# Patient Record
Sex: Female | Born: 1935
Health system: Southern US, Community
[De-identification: ages and names within clinical notes are randomized; demographics above are authoritative.]

## PROBLEM LIST (undated history)

## (undated) DIAGNOSIS — R0789 Other chest pain: Secondary | ICD-10-CM

## (undated) DIAGNOSIS — K219 Gastro-esophageal reflux disease without esophagitis: Secondary | ICD-10-CM

## (undated) DIAGNOSIS — R1310 Dysphagia, unspecified: Secondary | ICD-10-CM

## (undated) DIAGNOSIS — M1990 Unspecified osteoarthritis, unspecified site: Secondary | ICD-10-CM

## (undated) DIAGNOSIS — I10 Essential (primary) hypertension: Secondary | ICD-10-CM

## (undated) DIAGNOSIS — I351 Nonrheumatic aortic (valve) insufficiency: Secondary | ICD-10-CM

## (undated) DIAGNOSIS — M810 Age-related osteoporosis without current pathological fracture: Secondary | ICD-10-CM

## (undated) DIAGNOSIS — C50919 Malignant neoplasm of unspecified site of unspecified female breast: Secondary | ICD-10-CM

## (undated) DIAGNOSIS — J45909 Unspecified asthma, uncomplicated: Secondary | ICD-10-CM

## (undated) HISTORY — DX: Nonrheumatic aortic (valve) insufficiency: I35.1

## (undated) HISTORY — PX: MASTECTOMY: SHX3

## (undated) HISTORY — DX: Essential (primary) hypertension: I10

## (undated) HISTORY — PX: BUNIONECTOMY: SHX129

## (undated) HISTORY — DX: Malignant neoplasm of unspecified site of unspecified female breast: C50.919

## (undated) HISTORY — DX: Unspecified osteoarthritis, unspecified site: M19.90

## (undated) HISTORY — DX: Other chest pain: R07.89

## (undated) HISTORY — PX: HAMMER TOE SURGERY: SHX385

## (undated) HISTORY — DX: Age-related osteoporosis without current pathological fracture: M81.0

## (undated) HISTORY — DX: Unspecified asthma, uncomplicated: J45.909

## (undated) HISTORY — DX: Gastro-esophageal reflux disease without esophagitis: K21.9

## (undated) HISTORY — DX: Dysphagia, unspecified: R13.10

---

## 1985-04-20 HISTORY — PX: MASTECTOMY: SHX3

## 2008-05-05 ENCOUNTER — Emergency Department (HOSPITAL_COMMUNITY): Admission: EM | Admit: 2008-05-05 | Discharge: 2008-05-05 | Payer: Self-pay | Admitting: Emergency Medicine

## 2010-08-04 LAB — URINE MICROSCOPIC-ADD ON

## 2010-08-04 LAB — DIFFERENTIAL
Basophils Absolute: 0.1 10*3/uL (ref 0.0–0.1)
Lymphocytes Relative: 44 % (ref 12–46)
Neutro Abs: 1.7 10*3/uL (ref 1.7–7.7)

## 2010-08-04 LAB — GLUCOSE, CAPILLARY: Glucose-Capillary: 88 mg/dL (ref 70–99)

## 2010-08-04 LAB — PROTIME-INR
INR: 1.1 (ref 0.00–1.49)
Prothrombin Time: 14.3 seconds (ref 11.6–15.2)

## 2010-08-04 LAB — URINALYSIS, ROUTINE W REFLEX MICROSCOPIC
Bilirubin Urine: NEGATIVE
Nitrite: POSITIVE — AB
Specific Gravity, Urine: 1.018 (ref 1.005–1.030)
pH: 6 (ref 5.0–8.0)

## 2010-08-04 LAB — COMPREHENSIVE METABOLIC PANEL
GFR calc Af Amer: >60 mL/min (ref >60)
GFR calc non Af Amer: 56 mL/min — ABNORMAL LOW (ref >60)
Glucose, Bld: 100 mg/dL — ABNORMAL HIGH (ref 70–99)
Potassium: 3.7 mEq/L (ref 3.5–5.1)
Sodium: 144 mEq/L (ref 135–145)
Total Bilirubin: 0.6 mg/dL (ref 0.3–1.2)
Total Protein: 7.3 g/dL (ref 6.0–8.3)

## 2010-08-04 LAB — CBC
Hemoglobin: 12.9 g/dL (ref 12.0–15.0)
RDW: 13.6 % (ref 11.5–15.5)

## 2010-08-04 LAB — SEDIMENTATION RATE: Sed Rate: 20 mm/hr (ref 0–22)

## 2012-06-29 DIAGNOSIS — M810 Age-related osteoporosis without current pathological fracture: Secondary | ICD-10-CM | POA: Insufficient documentation

## 2012-06-29 DIAGNOSIS — K219 Gastro-esophageal reflux disease without esophagitis: Secondary | ICD-10-CM | POA: Insufficient documentation

## 2012-06-29 DIAGNOSIS — J4520 Mild intermittent asthma, uncomplicated: Secondary | ICD-10-CM | POA: Insufficient documentation

## 2012-06-29 DIAGNOSIS — Z853 Personal history of malignant neoplasm of breast: Secondary | ICD-10-CM | POA: Insufficient documentation

## 2012-06-29 DIAGNOSIS — M1990 Unspecified osteoarthritis, unspecified site: Secondary | ICD-10-CM | POA: Insufficient documentation

## 2012-08-31 DIAGNOSIS — R1319 Other dysphagia: Secondary | ICD-10-CM | POA: Insufficient documentation

## 2012-12-01 DIAGNOSIS — K222 Esophageal obstruction: Secondary | ICD-10-CM | POA: Insufficient documentation

## 2012-12-01 DIAGNOSIS — B3781 Candidal esophagitis: Secondary | ICD-10-CM | POA: Insufficient documentation

## 2013-06-27 DIAGNOSIS — R109 Unspecified abdominal pain: Secondary | ICD-10-CM | POA: Insufficient documentation

## 2013-08-23 ENCOUNTER — Encounter: Payer: Self-pay | Admitting: Podiatry

## 2013-08-23 ENCOUNTER — Ambulatory Visit (INDEPENDENT_AMBULATORY_CARE_PROVIDER_SITE_OTHER): Payer: Medicare Other | Admitting: Podiatry

## 2013-08-23 ENCOUNTER — Ambulatory Visit (INDEPENDENT_AMBULATORY_CARE_PROVIDER_SITE_OTHER): Payer: Medicare Other

## 2013-08-23 VITALS — BP 116/73 | HR 73 | Resp 16 | Ht 61.0 in | Wt 136.0 lb

## 2013-08-23 DIAGNOSIS — M2040 Other hammer toe(s) (acquired), unspecified foot: Secondary | ICD-10-CM

## 2013-08-23 DIAGNOSIS — M779 Enthesopathy, unspecified: Secondary | ICD-10-CM

## 2013-08-23 DIAGNOSIS — L84 Corns and callosities: Secondary | ICD-10-CM

## 2013-08-23 DIAGNOSIS — M2010 Hallux valgus (acquired), unspecified foot: Secondary | ICD-10-CM

## 2013-08-23 MED ORDER — TRIAMCINOLONE ACETONIDE 10 MG/ML IJ SUSP
10.0000 mg | Freq: Once | INTRAMUSCULAR | Status: AC
  Administered 2013-08-23: 10 mg

## 2013-08-23 NOTE — Progress Notes (Signed)
   Subjective:    Patient ID: Joy Cain, female    DOB: October 24, 1935, 78 y.o.   MRN: [id number]  HPI Comments: "My toe hurts"  Patient c/o aching 5th toe left for about 2 months. The area is callused. Hurts with shoes. She has been using medicated corns pad-makes sensitive.   Also, concerned about thickened skin plantar bilateral.  Toe Pain       Review of Systems  Constitutional: Positive for unexpected weight change.  HENT: Positive for trouble swallowing.   Eyes: Positive for itching.  Gastrointestinal: Positive for abdominal pain.  Musculoskeletal: Positive for arthralgias.  Skin: Positive for rash.  Neurological: Positive for dizziness and light-headedness.  All other systems reviewed and are negative.      Objective:   Physical Exam        Assessment & Plan:

## 2013-08-24 NOTE — Progress Notes (Signed)
Subjective:     Patient ID: Joy Cain, female   DOB: 04-19-1936, 78 y.o.   MRN: [id number]  Toe Pain    patient states that of a painful fifth toe on my left foot that has keratotic tissue formation and fluid buildup and is making it hard for me to wear shoe gear   Review of Systems  All other systems reviewed and are negative.      Objective:   Physical Exam  Nursing note and vitals reviewed. Constitutional: She is oriented to person, place, and time.  Cardiovascular: Intact distal pulses.   Musculoskeletal: Normal range of motion.  Neurological: She is oriented to person, place, and time.  Skin: Skin is warm.   neurovascular status is intact with muscle strength adequate range of motion of the subtalar midtarsal joint mildly restricted. Patient has structural malalignment of digits with structural bunion formation and digital deformity with fifth toe left being rotated with keratotic lesion formation and fluid buildup at the interphalangeal joint     Assessment:     Forefoot structural derangement with inflamed fifth toe left foot that is painful when pressed with lesion formation    Plan:     H&P and x-ray reviewed. Careful interphalangeal joint injection last 2 mg dexamethasone 3 mg Xylocaine and debridement of lesion accomplished with padding. Reappoint as needed for consideration of surgery

## 2013-09-18 ENCOUNTER — Telehealth: Payer: Self-pay | Admitting: *Deleted

## 2013-09-18 NOTE — Telephone Encounter (Signed)
In to see Dr. Paulla Dolly 2-3 weeks ago.  He did a procedure but my toe is not feeling any better.  In fact, I think it has gotten worse.  I'll be expecting a call.  I called and left the patient a message that the only way to get rid of it permanently per Dr. Paulla Dolly is through surgery.  If you want it trimmed again give [place] a call back to schedule an appointment with Dr. Paulla Dolly.

## 2014-01-09 DIAGNOSIS — R1310 Dysphagia, unspecified: Secondary | ICD-10-CM | POA: Diagnosis not present

## 2014-01-09 DIAGNOSIS — Z853 Personal history of malignant neoplasm of breast: Secondary | ICD-10-CM | POA: Diagnosis not present

## 2014-01-09 DIAGNOSIS — R42 Dizziness and giddiness: Secondary | ICD-10-CM | POA: Diagnosis not present

## 2014-01-09 DIAGNOSIS — Z23 Encounter for immunization: Secondary | ICD-10-CM | POA: Diagnosis not present

## 2014-01-09 DIAGNOSIS — Z1239 Encounter for other screening for malignant neoplasm of breast: Secondary | ICD-10-CM | POA: Diagnosis not present

## 2014-01-12 ENCOUNTER — Other Ambulatory Visit: Payer: Self-pay | Admitting: Family Medicine

## 2014-01-12 DIAGNOSIS — R42 Dizziness and giddiness: Secondary | ICD-10-CM

## 2014-01-12 DIAGNOSIS — Z853 Personal history of malignant neoplasm of breast: Secondary | ICD-10-CM

## 2014-01-16 ENCOUNTER — Ambulatory Visit
Admission: RE | Admit: 2014-01-16 | Discharge: 2014-01-16 | Disposition: A | Discharge status: Home / Self Care | Payer: PRIVATE HEALTH INSURANCE | Source: Ambulatory Visit | Attending: Family Medicine | Admitting: Family Medicine

## 2014-01-16 DIAGNOSIS — R42 Dizziness and giddiness: Secondary | ICD-10-CM | POA: Diagnosis not present

## 2014-01-16 DIAGNOSIS — Z853 Personal history of malignant neoplasm of breast: Secondary | ICD-10-CM | POA: Diagnosis not present

## 2014-01-16 DIAGNOSIS — H519 Unspecified disorder of binocular movement: Secondary | ICD-10-CM | POA: Diagnosis not present

## 2014-01-16 DIAGNOSIS — R51 Headache: Secondary | ICD-10-CM | POA: Diagnosis not present

## 2014-01-18 ENCOUNTER — Telehealth: Payer: Self-pay | Admitting: *Deleted

## 2014-01-18 NOTE — Telephone Encounter (Signed)
Received referral from [facility] on 01/17/14.  Called pt and confirmed 02/13/14 appt w/ pt.  Mailed welcoming packet, calendar and intake form to pt.  Called and left a message for Amy at the referring office to make her aware.

## 2014-02-13 ENCOUNTER — Encounter (INDEPENDENT_AMBULATORY_CARE_PROVIDER_SITE_OTHER): Payer: Self-pay

## 2014-02-13 ENCOUNTER — Encounter: Payer: Self-pay | Admitting: Hematology and Oncology

## 2014-02-13 ENCOUNTER — Ambulatory Visit: Payer: PRIVATE HEALTH INSURANCE

## 2014-02-13 ENCOUNTER — Ambulatory Visit (HOSPITAL_BASED_OUTPATIENT_CLINIC_OR_DEPARTMENT_OTHER): Payer: MEDICARE | Admitting: Hematology and Oncology

## 2014-02-13 VITALS — BP 173/66 | HR 60 | Temp 98.4°F | Resp 18 | Ht 61.0 in | Wt 146.3 lb

## 2014-02-13 DIAGNOSIS — Z853 Personal history of malignant neoplasm of breast: Secondary | ICD-10-CM

## 2014-02-13 DIAGNOSIS — C50512 Malignant neoplasm of lower-outer quadrant of left female breast: Secondary | ICD-10-CM | POA: Insufficient documentation

## 2014-02-13 NOTE — Progress Notes (Signed)
[facility] CONSULT NOTE  Patient Care Team: No Pcp Per Patient as PCP - General (General Practice)  CHIEF COMPLAINTS/PURPOSE OF CONSULTATION:  Breast cancer 1986  HISTORY OF PRESENTING ILLNESS:  Joy Cain 78 y.o. female is here because of recent diagnosis of left breast cancer diagnosed 1986 treated with mastectomy. She reports that she did not require any adjuvant therapy. She has had no major problems with the cost of the breast. She gets annual mammograms which have all been normal. She is more to [place] area from [place] and would like to follow up with [place]. She reports no new problems or concerns other than the recent headaches and vertigo. She recent brain MRI which was normal.  I reviewed her records extensively and collaborated the history with the patient.  MEDICAL HISTORY:  Asthma, osteoporosis, acid reflux, dysphagia, left breast cancer SURGICAL HISTORY: Mastectomy in 1986 For foot bunion removal surgeries and for hammertoe surgery SOCIAL HISTORY: History   Social History  . Marital Status: Married    Spouse Name: N/A    Number of Children: N/A  . Years of Education: N/A   Occupational History  . Not on file.   Social History Main Topics  . Smoking status: Never Smoker   . Smokeless tobacco: Not on file  . Alcohol Use: No  . Drug Use: Not on file  . Sexual Activity: Not on file   Other Topics Concern  . Not on file   Social History Narrative  . No narrative on file    FAMILY HISTORY: Sister age 41 of breast cancer  ALLERGIES:  is allergic to aspirin.  MEDICATIONS:  Current Outpatient Prescriptions  Medication Sig Dispense Refill  . Lansoprazole (PREVACID PO) Take by mouth.       No current facility-administered medications for this visit.    REVIEW OF SYSTEMS:   Constitutional: Denies fevers, chills or abnormal night sweats Eyes: Headaches and vertigo Ears, nose, mouth, throat, and face: Denies mucositis or sore  throat Respiratory: Denies cough, dyspnea or wheezes Cardiovascular: Denies palpitation, chest discomfort or lower extremity swelling Gastrointestinal:  Denies nausea, heartburn or change in bowel habits Skin: Denies abnormal skin rashes Lymphatics: Denies new lymphadenopathy or easy bruising Neurological:Denies numbness, tingling or new weaknesses Behavioral/Psych: Mood is stable, no new changes  Breast:  Denies any palpable lumps or discharge All other systems were reviewed with the patient and are negative.  PHYSICAL EXAMINATION: ECOG PERFORMANCE STATUS: 1 - Symptomatic but completely ambulatory  Filed Vitals:   02/13/14 1351  BP: 173/66  Pulse:   Temp:   Resp:    Filed Weights   02/13/14 1320  Weight: 146 lb 4.8 oz (66.361 kg)    GENERAL:alert, no distress and comfortable SKIN: skin color, texture, turgor are normal, no rashes or significant lesions EYES: normal, conjunctiva are pink and non-injected, sclera clear OROPHARYNX:no exudate, no erythema and lips, buccal mucosa, and tongue normal  NECK: supple, thyroid normal size, non-tender, without nodularity LYMPH:  no palpable lymphadenopathy in the cervical, axillary or inguinal LUNGS: clear to auscultation and percussion with normal breathing effort HEART: regular rate & rhythm and no murmurs and no lower extremity edema ABDOMEN:abdomen soft, non-tender and normal bowel sounds Musculoskeletal:no cyanosis of digits and no clubbing  PSYCH: alert & oriented x 3 with fluent speech NEURO: no focal motor/sensory deficits BREAST: No palpable nodules in breast. No palpable axillary or supraclavicular lymphadenopathy  LABORATORY DATA:  I have reviewed the data as listed Lab Results  Component  Value Date   WBC 4.2 05/05/2008   HGB 12.9 05/05/2008   HCT 39.3 05/05/2008   MCV 83.9 05/05/2008   PLT 205 05/05/2008   Lab Results  Component Value Date   NA 144 05/05/2008   K 3.7 05/05/2008   CL 110 05/05/2008   CO2 29 05/05/2008     RADIOGRAPHIC STUDIES: I have personally reviewed the radiological reports and agreed with the findings in the report.  ASSESSMENT AND PLAN:  Breast cancer of lower-outer quadrant of left female breast Left breast cancer diagnosed and treated in [place] probably stage I we do not know the receptor status the patient had a mastectomy. She reports that she did not require any antiestrogen therapy or radiation afterwards. She has been followed routinely with bone scans one to 2006 apparently. After that she has been only father with mammograms. She has been at [facility] until recently moved to [place] area and wanted to set up oncology care with [place]. Recently she had an episode of dizziness and headache for which she had brain MRI which was normal.  Surveillance: [name] breast exam was completely normal. Recent headaches: I encouraged her to discuss with her primary care about other causes of headache. The recent headache was associated with vertigo as well.  Return to clinic annually for physical exam and mammograms.   All questions were answered. The patient knows to call the clinic with any problems, questions or concerns. I spent 30 minutes counseling the patient face to face. The total time spent in the appointment was 45 minutes and more than 50% was on counseling.     Rulon Eisenmenger, MD 02/13/2014 5:13 PM

## 2014-02-13 NOTE — Assessment & Plan Note (Signed)
Left breast cancer diagnosed and treated in [place] probably stage I we do not know the receptor status the patient had a mastectomy. She reports that she did not require any antiestrogen therapy or radiation afterwards. She has been followed routinely with bone scans one to 2006 apparently. After that she has been only father with mammograms. She has been at [facility] until recently moved to [place] area and wanted to set up oncology care with [place]. Recently she had an episode of dizziness and headache for which she had brain MRI which was normal.  Surveillance: [name] breast exam was completely normal. Recent headaches: I encouraged her to discuss with her primary care about other causes of headache. The recent headache was associated with vertigo as well.  Return to clinic annually for physical exam and mammograms.

## 2014-02-13 NOTE — Progress Notes (Signed)
Checked in new pt with no financial concerns at this time.  Pt has Raquel's card for any questions or concerns she may have in the future.  ° °

## 2014-02-21 ENCOUNTER — Encounter: Payer: Self-pay | Admitting: Hematology and Oncology

## 2014-02-21 NOTE — Addendum Note (Signed)
Addended by: Prentiss Bells on: 02/21/2014 02:58 PM   Modules accepted: Orders, Medications

## 2014-02-21 NOTE — Progress Notes (Signed)
New patient intake form sent to scan.   

## 2014-02-22 ENCOUNTER — Telehealth: Payer: Self-pay | Admitting: Hematology and Oncology

## 2014-02-27 ENCOUNTER — Other Ambulatory Visit: Payer: Self-pay | Admitting: Hematology and Oncology

## 2014-02-27 DIAGNOSIS — Z1231 Encounter for screening mammogram for malignant neoplasm of breast: Secondary | ICD-10-CM

## 2014-03-20 ENCOUNTER — Ambulatory Visit
Admission: RE | Admit: 2014-03-20 | Discharge: 2014-03-20 | Disposition: A | Discharge status: Home / Self Care | Payer: MEDICARE | Source: Ambulatory Visit | Attending: Hematology and Oncology | Admitting: Hematology and Oncology

## 2014-03-20 DIAGNOSIS — Z1231 Encounter for screening mammogram for malignant neoplasm of breast: Secondary | ICD-10-CM

## 2015-02-03 ENCOUNTER — Emergency Department (HOSPITAL_COMMUNITY): Payer: Medicare Other

## 2015-02-03 ENCOUNTER — Encounter (HOSPITAL_COMMUNITY): Payer: Self-pay

## 2015-02-03 ENCOUNTER — Observation Stay (HOSPITAL_COMMUNITY)
Admission: EM | Admit: 2015-02-03 | Discharge: 2015-02-05 | Disposition: A | Discharge status: Home / Self Care | Payer: Medicare Other | Attending: Family Medicine | Admitting: Family Medicine

## 2015-02-03 DIAGNOSIS — R262 Difficulty in walking, not elsewhere classified: Secondary | ICD-10-CM | POA: Diagnosis not present

## 2015-02-03 DIAGNOSIS — R001 Bradycardia, unspecified: Secondary | ICD-10-CM | POA: Insufficient documentation

## 2015-02-03 DIAGNOSIS — M1990 Unspecified osteoarthritis, unspecified site: Secondary | ICD-10-CM | POA: Diagnosis not present

## 2015-02-03 DIAGNOSIS — IMO0001 Reserved for inherently not codable concepts without codable children: Secondary | ICD-10-CM | POA: Diagnosis present

## 2015-02-03 DIAGNOSIS — R200 Anesthesia of skin: Secondary | ICD-10-CM | POA: Diagnosis not present

## 2015-02-03 DIAGNOSIS — I639 Cerebral infarction, unspecified: Secondary | ICD-10-CM | POA: Diagnosis not present

## 2015-02-03 DIAGNOSIS — R42 Dizziness and giddiness: Secondary | ICD-10-CM | POA: Diagnosis not present

## 2015-02-03 DIAGNOSIS — Z886 Allergy status to analgesic agent status: Secondary | ICD-10-CM | POA: Insufficient documentation

## 2015-02-03 DIAGNOSIS — Z853 Personal history of malignant neoplasm of breast: Secondary | ICD-10-CM | POA: Diagnosis not present

## 2015-02-03 DIAGNOSIS — R030 Elevated blood-pressure reading, without diagnosis of hypertension: Secondary | ICD-10-CM | POA: Diagnosis not present

## 2015-02-03 DIAGNOSIS — H8110 Benign paroxysmal vertigo, unspecified ear: Secondary | ICD-10-CM | POA: Diagnosis not present

## 2015-02-03 LAB — PROTIME-INR
INR: 1.18 (ref 0.00–1.49)
Prothrombin Time: 15.2 seconds (ref 11.6–15.2)

## 2015-02-03 LAB — I-STAT CHEM 8, ED
BUN: 15 mg/dL (ref 6–20)
CALCIUM ION: 1.15 mmol/L (ref 1.13–1.30)
CHLORIDE: 104 mmol/L (ref 101–111)
Creatinine, Ser: 1.2 mg/dL — ABNORMAL HIGH (ref 0.44–1.00)
GLUCOSE: 100 mg/dL — AB (ref 65–99)
HCT: 41 % (ref 36.0–46.0)
HEMOGLOBIN: 13.9 g/dL (ref 12.0–15.0)
POTASSIUM: 3.9 mmol/L (ref 3.5–5.1)
SODIUM: 141 mmol/L (ref 135–145)
TCO2: 25 mmol/L (ref 0–100)

## 2015-02-03 LAB — COMPREHENSIVE METABOLIC PANEL
ALK PHOS: 70 U/L (ref 38–126)
ALT: 15 U/L (ref 14–54)
AST: 22 U/L (ref 15–41)
Albumin: 3.8 g/dL (ref 3.5–5.0)
Anion gap: 8 (ref 5–15)
BILIRUBIN TOTAL: 0.4 mg/dL (ref 0.3–1.2)
BUN: 13 mg/dL (ref 6–20)
CALCIUM: 8.8 mg/dL — AB (ref 8.9–10.3)
CO2: 26 mmol/L (ref 22–32)
CREATININE: 1.18 mg/dL — AB (ref 0.44–1.00)
Chloride: 101 mmol/L (ref 101–111)
GFR, EST AFRICAN AMERICAN: 50 mL/min — AB (ref >60)
GFR, EST NON AFRICAN AMERICAN: 43 mL/min — AB (ref >60)
Glucose, Bld: 98 mg/dL (ref 65–99)
Potassium: 3.9 mmol/L (ref 3.5–5.1)
Sodium: 135 mmol/L (ref 135–145)
TOTAL PROTEIN: 8.1 g/dL (ref 6.5–8.1)

## 2015-02-03 LAB — CBC
HEMATOCRIT: 41 % (ref 36.0–46.0)
Hemoglobin: 13.4 g/dL (ref 12.0–15.0)
MCH: 28 pg (ref 26.0–34.0)
MCHC: 32.7 g/dL (ref 30.0–36.0)
MCV: 85.6 fL (ref 78.0–100.0)
Platelets: 190 10*3/uL (ref 150–400)
RBC: 4.79 MIL/uL (ref 3.87–5.11)
RDW: 13.7 % (ref 11.5–15.5)
WBC: 4.6 10*3/uL (ref 4.0–10.5)

## 2015-02-03 LAB — DIFFERENTIAL
Basophils Absolute: 0 10*3/uL (ref 0.0–0.1)
Basophils Relative: 0 %
EOS PCT: 0 %
Eosinophils Absolute: 0 10*3/uL (ref 0.0–0.7)
LYMPHS ABS: 1.9 10*3/uL (ref 0.7–4.0)
LYMPHS PCT: 41 %
MONO ABS: 0.4 10*3/uL (ref 0.1–1.0)
MONOS PCT: 9 %
NEUTROS ABS: 2.2 10*3/uL (ref 1.7–7.7)
Neutrophils Relative %: 50 %

## 2015-02-03 LAB — I-STAT TROPONIN, ED: Troponin i, poc: 0.01 ng/mL (ref 0.00–0.08)

## 2015-02-03 LAB — APTT: aPTT: 33 seconds (ref 24–37)

## 2015-02-03 LAB — CBG MONITORING, ED
Glucose-Capillary: 94 mg/dL (ref 65–99)
Glucose-Capillary: 105 mg/dL — ABNORMAL HIGH (ref 65–99)

## 2015-02-03 NOTE — H&P (Signed)
[facility] History and Physical  Joy Cain GYJ:[id number] DOB: [birth year] DOA: 02/03/2015  Referring physician: Dr. Tamera Punt. PCP: PROVIDER NOT IN SYSTEM Dr. Chapman Fitch. Specialists: Dr. Lindi Adie. Oncologist.  Chief Complaint: Dizziness and left-sided numbness.  HPI: Joy Cain is a 79 y.o. female with history of breast cancer in remission presence of the ER because of left-sided numbness and persistent dizziness. Patient has been having vertigo off and on for last few weeks but over the last 48 hours it has become persistent. Patient has mild left-sided headache. Denies any visual symptoms nausea vomiting or urinary difficulties. This evening while watching football patient started developing numbness of the left hand and left foot. This has been persistent. In the ER CT head was negative for anything acute. On-call neurologist Dr. Doy Mince has been consulted and patient has been admitted for further management for possible TIA versus stroke. Patient denies any weakness of upper or lower extremities. Denies any difficulty speaking or swallowing the patient had some burning sensation in the throat yesterday. Patient's blood pressures found to be elevated and patient is mildly bradycardic. Patient states she does not have any previous history of hypertension. Denies any chest pain or palpitation. On exam patient is able to move all extremities with good strength.   Review of Systems: As presented in the history of presenting illness, rest negative.  Past Medical History  Diagnosis Date  . Breast cancer ([place])   . Asthma   . Osteoporosis   . GERD (gastroesophageal reflux disease)   . Dysphagia    Past Surgical History  Procedure Laterality Date  . Bunionectomy    . Hammer toe surgery    . Mastectomy     Social History:  reports that she has never smoked. She has never used smokeless tobacco. She reports that she does not drink alcohol or use illicit drugs. Where does patient live  home. Can patient participate in ADLs? Yes.  Allergies  Allergen Reactions  . Aspirin Other (See Comments)    GI TRACT UPSET    Family History:  Family History  Problem Relation Age of Onset  . Cancer Sister   . Cancer Brother   . Stroke Father       Prior to Admission medications   Medication Sig Start Date End Date Taking? Authorizing Provider  glucosamine-chondroitin 500-400 MG tablet Take 1 tablet by mouth daily as needed ([place]).   Yes Historical Provider, MD  ibuprofen (ADVIL,MOTRIN) 200 MG tablet Take 400 mg by mouth every 6 (six) hours as needed.   Yes Historical Provider, MD    Physical Exam: Filed Vitals:   02/03/15 2215 02/03/15 2230 02/03/15 2245 02/03/15 2300  BP: 160/70 177/70  170/80  Pulse: 55 61 60 54  Temp:      TempSrc:      Resp: 20     Height:      SpO2: 95% 100% 99% 96%     General:  Moderately built and nourished.  Eyes: Anicteric. No pallor.  ENT: No discharge from the ears eyes nose and mouth.  Neck: No JVD appreciated. No mass felt. No neck rigidity.  Cardiovascular: S1-S2 heard.  Respiratory: No rhonchi or crepitations.  Abdomen: Soft nontender bowel sounds present.  Skin: No rash.  Musculoskeletal: No edema.  Psychiatric: Appears normal.  Neurologic: Alert awake oriented to time place and person. Moves all extremities 5 x 5. No facial asymmetry. Tongue is midline. PERRLA positive.  Labs on Admission:  Basic Metabolic Panel:  Recent Labs  Lab 02/03/15 2055 02/03/15 2112  NA 135 141  K 3.9 3.9  CL 101 104  CO2 26  --   GLUCOSE 98 100*  BUN 13 15  CREATININE 1.18* 1.20*  CALCIUM 8.8*  --    Liver Function Tests:  Recent Labs Lab 02/03/15 2055  AST 22  ALT 15  ALKPHOS 70  BILITOT 0.4  PROT 8.1  ALBUMIN 3.8   No results for input(s): LIPASE, AMYLASE in the last 168 hours. No results for input(s): AMMONIA in the last 168 hours. CBC:  Recent Labs Lab 02/03/15 2055 02/03/15 2112  WBC 4.6  --    NEUTROABS 2.2  --   HGB 13.4 13.9  HCT 41.0 41.0  MCV 85.6  --   PLT 190  --    Cardiac Enzymes: No results for input(s): CKTOTAL, CKMB, CKMBINDEX, TROPONINI in the last 168 hours.  BNP (last 3 results) No results for input(s): BNP in the last 8760 hours.  ProBNP (last 3 results) No results for input(s): PROBNP in the last 8760 hours.  CBG:  Recent Labs Lab 02/03/15 2054 02/03/15 2115  GLUCAP 94 105*    Radiological Exams on Admission: Ct Head Wo Contrast  02/03/2015  CLINICAL DATA:  Code stroke. Right arm numbness for 1 hr, now with right leg numbness. Headache. History of breast cancer. EXAM: CT HEAD WITHOUT CONTRAST TECHNIQUE: Contiguous axial images were obtained from the base of the skull through the vertex without intravenous contrast. COMPARISON:  Brain MRI 01/16/2014, head CT 05/05/2008 FINDINGS: No evidence of territorial infarct. No intracranial hemorrhage, mass effect, or midline shift. Mild chronic small vessel ischemic change. No hydrocephalus. The basilar cisterns are patent. No intracranial fluid collection. Calvarium is intact. Minimal fluid in the right side of sphenoid sinus, similar in degree to prior. Mild mucosal thickening of the ethmoid air cells. The mastoid air cells are well aerated. IMPRESSION: Mild chronic small vessel ischemic change without CT findings of acute intracranial abnormality. These results were called by telephone at the time of interpretation on 02/03/2015 at 9:19 pm to Dr. Doy Mince , who verbally acknowledged these results. Electronically Signed   By: Jeb Levering M.D.   On: 02/03/2015 21:19    EKG: Independently reviewed. Normal sinus rhythm with LVH.  Assessment/Plan Principal Problem:   Left sided numbness Active Problems:   Elevated blood pressure   1. Left-sided numbness with persistent dizziness/vertigo - concerning for possible stroke versus TIA. Patient has been placed on neurochecks. Swallow evaluation. Appreciate  neurology consult. Will get MRI/MRA brain 2-D echo and carotid Doppler. Aspirin. Check hemoglobin A1c and lipid panel. Physical therapy consult. 2. Elevated blood pressure - for now we will allow permissive hypertension for possible stroke. His stroke has been ruled out then if patient is still hypertensive then will need definite antihypertensives. Closely follow blood pressure trends. Patient is placed on when necessary IV hydralazine for systolic blood pressure more than 210. 3. Bradycardia - patient has sinus bradycardia with heart rate around the 58. Closely monitor in telemetry. Check TSH. 4. History of breast cancer in remission being followed by oncologist.  I have personally reviewed patient's EKG. I have reviewed neurologist consult and recommendations. I have reviewed patient's old charts and labs.   DVT Prophylaxis Lovenox.  Code Status: Full code.  Family Communication: Patient's husband.  Disposition Plan: Admit for observation.    Jakeel Starliper N. [facility] Pager [phone].  If 7PM-7AM, please contact night-coverage [email or web address] Password [email or web address] 02/03/2015, 11:24 PM

## 2015-02-03 NOTE — ED Notes (Signed)
Pt reports dizziness and headache onset 2 days ago. Pt reports left arm numbness onset tonight one hour PTA and leg numbness onset 30 minutes PTA. Left leg and arm weakness upon arrival. Pt speaking clear, complete sentences, no facial droop. Dr. Tamera Punt at triage to assess patient and code stroke called.

## 2015-02-03 NOTE — ED Provider Notes (Signed)
CSN: [id number]     Arrival date & time 02/03/15  2046 History   First MD Initiated Contact with Patient 02/03/15 2129     Chief Complaint  Patient presents with  . Code Stroke     (Consider location/radiation/quality/duration/timing/severity/associated sxs/prior Treatment) HPI Comments: Patient presents with dizziness and left-sided weakness. She has a history of asthma and GERD. She states that she has intermittent episodes of vertigo. She's had vertigo for the last 2 days with dizziness and spinning sensation. She denies any headache. She states about an hour prior to arrival she noted some numbness in her left arm and left leg with some weakness in her left leg. She denies any speech deficits. There is no ataxia. No visual changes. Given that she had new onset numbness in her left arm left leg, code stroke was initiated.   Past Medical History  Diagnosis Date  . Breast cancer ([place])   . Asthma   . Osteoporosis   . GERD (gastroesophageal reflux disease)   . Dysphagia    Past Surgical History  Procedure Laterality Date  . Bunionectomy    . Hammer toe surgery    . Mastectomy     Family History  Problem Relation Age of Onset  . Cancer Sister   . Cancer Brother    Social History  Substance Use Topics  . Smoking status: Never Smoker   . Smokeless tobacco: Never Used  . Alcohol Use: No   OB History    No data available     Review of Systems  Constitutional: Negative for fever, chills, diaphoresis and fatigue.  HENT: Negative for congestion, rhinorrhea and sneezing.   Eyes: Negative.   Respiratory: Negative for cough, chest tightness and shortness of breath.   Cardiovascular: Negative for chest pain and leg swelling.  Gastrointestinal: Negative for nausea, vomiting, abdominal pain, diarrhea and blood in stool.  Genitourinary: Negative for frequency, hematuria, flank pain and difficulty urinating.  Musculoskeletal: Negative for back pain and arthralgias.  Skin: Negative  for rash.  Neurological: Positive for dizziness, weakness and numbness. Negative for speech difficulty and headaches.      Allergies  Aspirin  Home Medications   Prior to Admission medications   Medication Sig Start Date End Date Taking? Authorizing Provider  glucosamine-chondroitin 500-400 MG tablet Take 1 tablet by mouth daily as needed ([place]).   Yes Historical Provider, MD  ibuprofen (ADVIL,MOTRIN) 200 MG tablet Take 400 mg by mouth every 6 (six) hours as needed.   Yes Historical Provider, MD   BP 195/92 mmHg  Pulse 66  Temp(Src) 98.5 F (36.9 C) (Oral)  Resp 14  Ht 5' 1" (1.549 m)  SpO2 98% Physical Exam  Constitutional: She is oriented to person, place, and time. She appears well-developed and well-nourished.  HENT:  Head: Normocephalic and atraumatic.  Eyes: Pupils are equal, round, and reactive to light.  Neck: Normal range of motion. Neck supple.  Cardiovascular: Normal rate, regular rhythm and normal heart sounds.   Pulmonary/Chest: Effort normal and breath sounds normal. No respiratory distress. She has no wheezes. She has no rales. She exhibits no tenderness.  Abdominal: Soft. Bowel sounds are normal. There is no tenderness. There is no rebound and no guarding.  Musculoskeletal: Normal range of motion. She exhibits no edema.  Lymphadenopathy:    She has no cervical adenopathy.  Neurological: She is alert and oriented to person, place, and time.  Patient has some numbness to of left-sided face as well as the  left arm and left leg to light touch. She has some noted weakness to the left leg as compared to the right, although mild. She has diminished rapid alternating movements on the left side.  Skin: Skin is warm and dry. No rash noted.  Psychiatric: She has a normal mood and affect.    ED Course  Procedures (including critical care time) Labs Review Labs Reviewed  I-STAT CHEM 8, ED - Abnormal; Notable for the following:    Creatinine, Ser 1.20 (*)     Glucose, Bld 100 (*)    All other components within normal limits  PROTIME-INR  APTT  CBC  DIFFERENTIAL  COMPREHENSIVE METABOLIC PANEL  CBG MONITORING, ED  I-STAT TROPOININ, ED  CBG MONITORING, ED    Imaging Review Ct Head Wo Contrast  02/03/2015  CLINICAL DATA:  Code stroke. Right arm numbness for 1 hr, now with right leg numbness. Headache. History of breast cancer. EXAM: CT HEAD WITHOUT CONTRAST TECHNIQUE: Contiguous axial images were obtained from the base of the skull through the vertex without intravenous contrast. COMPARISON:  Brain MRI 01/16/2014, head CT 05/05/2008 FINDINGS: No evidence of territorial infarct. No intracranial hemorrhage, mass effect, or midline shift. Mild chronic small vessel ischemic change. No hydrocephalus. The basilar cisterns are patent. No intracranial fluid collection. Calvarium is intact. Minimal fluid in the right side of sphenoid sinus, similar in degree to prior. Mild mucosal thickening of the ethmoid air cells. The mastoid air cells are well aerated. IMPRESSION: Mild chronic small vessel ischemic change without CT findings of acute intracranial abnormality. These results were called by telephone at the time of interpretation on 02/03/2015 at 9:19 pm to Dr. Doy Mince , who verbally acknowledged these results. Electronically Signed   By: Jeb Levering M.D.   On: 02/03/2015 21:19   I have personally reviewed and evaluated these images and lab results as part of my medical decision-making.   EKG Interpretation   Date/Time:  Sunday February 03 2015 21:18:11 EDT Ventricular Rate:  61 PR Interval:  151 QRS Duration: 76 QT Interval:  413 QTC Calculation: 416 R Axis:   66 Text Interpretation:  Sinus rhythm LVH with secondary repolarization  abnormality since last tracing no significant change Confirmed by Alucard Fearnow   MD, Durk Carmen ([id number]) on 02/03/2015 9:50:07 PM      MDM   Final diagnoses:  Cerebrovascular accident (CVA), unspecified mechanism ([place])     Dr. Doy Mince has evaluated patient. She did not feel the patient met code stroke criteria given that she's had associated dizziness for 2 days. Patient will be admitted to hospitalist service for a stroke evaluation.  Pt's PCP is Dr. Chapman Fitch with [facility].  Spoke with DR. Jack Quarto who will admit pt.  Malvin Johns, MD 02/03/15 2233

## 2015-02-03 NOTE — ED Notes (Signed)
CODE STROKE CANCELED-PER DR. BELFI.

## 2015-02-03 NOTE — Consult Note (Signed)
Referring Physician: Tamera Punt    Chief Complaint: Dizziness, left sided numbness  HPI: Joy Cain is an 79 y.o. female who reports that she has had intermittent vertigo for the past 2 weeks.  Yesterday her vertigo became persistent.  She went to bed vertiginous and on awakening this morning was still dizzy.  Her vertigo persisted throughout the day and this evening while watching the football game noted the onset of numbness in the left hand and in the left leg below the knee.  The patient became concerned at that time and was brought in for evaluation.  Initial NIHSS of 1.  Date last known well: Date: 02/02/2015 Time last known well: Unable to determine tPA Given: No: Outside time window  MRankin: 0  Past Medical History  Diagnosis Date  . Breast cancer ([place])   . Asthma   . Osteoporosis   . GERD (gastroesophageal reflux disease)   . Dysphagia     Past Surgical History  Procedure Laterality Date  . Bunionectomy    . Hammer toe surgery    . Mastectomy      Family History  Problem Relation Age of Onset  . Cancer Sister   . Cancer Brother      Father died of a stroke.  Morther died due to heart problems  Social History:  reports that she has never smoked. She has never used smokeless tobacco. She reports that she does not drink alcohol or use illicit drugs.  Allergies:  Allergies  Allergen Reactions  . Aspirin Other (See Comments)    GI TRACT UPSET    Medications: I have reviewed the patient's current medications. Prior to Admission:  Prior to Admission medications   Medication Sig Start Date End Date Taking? Authorizing Provider  glucosamine-chondroitin 500-400 MG tablet Take 1 tablet by mouth daily as needed ([place]).   Yes Historical Provider, MD  ibuprofen (ADVIL,MOTRIN) 200 MG tablet Take 400 mg by mouth every 6 (six) hours as needed.   Yes Historical Provider, MD   ROS: History obtained from the patient  General ROS: negative for - chills, fatigue,  fever, night sweats, weight gain or weight loss Psychological ROS: negative for - behavioral disorder, hallucinations, memory difficulties, mood swings or suicidal ideation Ophthalmic ROS: negative for - blurry vision, double vision, eye pain or loss of vision ENT ROS: negative for - epistaxis, nasal discharge, oral lesions, sore throat, tinnitus or vertigo Allergy and Immunology ROS: negative for - hives or itchy/watery eyes Hematological and Lymphatic ROS: negative for - bleeding problems, bruising or swollen lymph nodes Endocrine ROS: negative for - galactorrhea, hair pattern changes, polydipsia/polyuria or temperature intolerance Respiratory ROS: negative for - cough, hemoptysis, shortness of breath or wheezing Cardiovascular ROS: negative for - chest pain, dyspnea on exertion, edema or irregular heartbeat Gastrointestinal ROS: negative for - abdominal pain, diarrhea, hematemesis, nausea/vomiting or stool incontinence Genito-Urinary ROS: negative for - dysuria, hematuria, incontinence or urinary frequency/urgency Musculoskeletal ROS: negative for - joint swelling or muscular weakness Neurological ROS: as noted in HPI Dermatological ROS: negative for rash and skin lesion changes  Physical Examination: Blood pressure 195/92, pulse 66, temperature 98.5 F (36.9 C), temperature source Oral, resp. rate 14, height 5\' 1"  (1.549 m), SpO2 98 %.  HEENT-  Normocephalic, no lesions, without obvious abnormality.  Normal external eye and conjunctiva.  Normal TM's bilaterally.  Normal auditory canals and external ears. Normal external nose, mucus membranes and septum.  Normal pharynx. Cardiovascular- S1, S2 normal, pulses palpable throughout  Lungs- chest clear, no wheezing, rales, normal symmetric air entry Abdomen- soft, non-tender; bowel sounds normal; no masses,  no organomegaly Extremities- no edema Lymph-no adenopathy palpable Musculoskeletal-no joint tenderness, deformity or swelling Skin-warm  and dry, no hyperpigmentation, vitiligo, or suspicious lesions  Neurological Examination Mental Status: Alert, oriented, thought content appropriate.  Speech fluent without evidence of aphasia.  Able to follow 3 step commands without difficulty. Cranial Nerves: II: Discs flat bilaterally; Visual fields grossly normal, pupils equal, round, reactive to light and accommodation III,IV, VI: ptosis not present, extra-ocular motions intact bilaterally V,VII: smile symmetric, facial light touch sensation decreased on the left VIII: hearing normal bilaterally IX,X: gag reflex present XI: bilateral shoulder shrug XII: midline tongue extension Motor: Right : Upper extremity   5/5    Left:     Upper extremity   5/5  Lower extremity   5/5     Lower extremity   5-/5 Tone and bulk:normal tone throughout; no atrophy noted Sensory: Pinprick and light touch decreased on the left upper and lower extremity Deep Tendon Reflexes: 2+ in the upper extremities and absent in the lower extremities Plantars: Right: downgoing   Left: downgoing Cerebellar: normal finger-to-nose and normal heel-to-shin testing bilaterally Gait: not tested due to safety concerns   Laboratory Studies:  Basic Metabolic Panel:  Recent Labs Lab 02/03/15 2112  NA 141  K 3.9  CL 104  GLUCOSE 100*  BUN 15  CREATININE 1.20*    Liver Function Tests: No results for input(s): AST, ALT, ALKPHOS, BILITOT, PROT, ALBUMIN in the last 168 hours. No results for input(s): LIPASE, AMYLASE in the last 168 hours. No results for input(s): AMMONIA in the last 168 hours.  CBC:  Recent Labs Lab 02/03/15 2055 02/03/15 2112  WBC 4.6  --   NEUTROABS 2.2  --   HGB 13.4 13.9  HCT 41.0 41.0  MCV 85.6  --   PLT 190  --     Cardiac Enzymes: No results for input(s): CKTOTAL, CKMB, CKMBINDEX, TROPONINI in the last 168 hours.  BNP: Invalid input(s): POCBNP  CBG:  Recent Labs Lab 02/03/15 2054  GLUCAP 94    Microbiology: No  results found for this or any previous visit.  Coagulation Studies:  Recent Labs  02/03/15 2055  LABPROT 15.2  INR 1.18    Urinalysis: No results for input(s): COLORURINE, LABSPEC, PHURINE, GLUCOSEU, HGBUR, BILIRUBINUR, KETONESUR, PROTEINUR, UROBILINOGEN, NITRITE, LEUKOCYTESUR in the last 168 hours.  Invalid input(s): APPERANCEUR  Lipid Panel: No results found for: CHOL, TRIG, HDL, CHOLHDL, VLDL, LDLCALC  HgbA1C: No results found for: HGBA1C  Urine Drug Screen:  No results found for: LABOPIA, COCAINSCRNUR, LABBENZ, AMPHETMU, THCU, LABBARB  Alcohol Level: No results for input(s): ETH in the last 168 hours.  Other results: EKG: sinus rhythm at 61 bpm.  Imaging: Ct Head Wo Contrast  02/03/2015  CLINICAL DATA:  Code stroke. Right arm numbness for 1 hr, now with right leg numbness. Headache. History of breast cancer. EXAM: CT HEAD WITHOUT CONTRAST TECHNIQUE: Contiguous axial images were obtained from the base of the skull through the vertex without intravenous contrast. COMPARISON:  Brain MRI 01/16/2014, head CT 05/05/2008 FINDINGS: No evidence of territorial infarct. No intracranial hemorrhage, mass effect, or midline shift. Mild chronic small vessel ischemic change. No hydrocephalus. The basilar cisterns are patent. No intracranial fluid collection. Calvarium is intact. Minimal fluid in the right side of sphenoid sinus, similar in degree to prior. Mild mucosal thickening of the ethmoid air cells. The mastoid air  cells are well aerated. IMPRESSION: Mild chronic small vessel ischemic change without CT findings of acute intracranial abnormality. These results were called by telephone at the time of interpretation on 02/03/2015 at 9:19 pm to Dr. Doy Mince , who verbally acknowledged these results. Electronically Signed   By: Jeb Levering M.D.   On: 02/03/2015 21:19    Assessment: 79 y.o. female presenting with complaints of vertigo and left sided paresthesias.  Patient outside window for  tPA.  On no antiplatelet therapy at home.  Head CT personally reviewed and shows no acute changes.  Concern remains for a posterior circulation ischemic event.    Stroke Risk Factors - family history  Plan: 1. HgbA1c, fasting lipid panel 2. MRI, MRA  of the brain without contrast 3. Prophylactic therapy-Antiplatelet med: Aspirin - dose 81mg  daily 4. NPO until RN stroke swallow screen 5. Telemetry monitoring 6. Frequent neuro checks 7. Meclizine prn  Case discussed with Dr. Neal Dy, MD [facility] [phone] 02/03/2015, 9:32 PM

## 2015-02-04 ENCOUNTER — Observation Stay (HOSPITAL_BASED_OUTPATIENT_CLINIC_OR_DEPARTMENT_OTHER): Payer: Medicare Other

## 2015-02-04 ENCOUNTER — Observation Stay (HOSPITAL_COMMUNITY): Payer: Medicare Other

## 2015-02-04 DIAGNOSIS — I6789 Other cerebrovascular disease: Secondary | ICD-10-CM | POA: Diagnosis not present

## 2015-02-04 DIAGNOSIS — R200 Anesthesia of skin: Secondary | ICD-10-CM | POA: Diagnosis not present

## 2015-02-04 DIAGNOSIS — R51 Headache: Secondary | ICD-10-CM | POA: Diagnosis not present

## 2015-02-04 LAB — CBC
HEMATOCRIT: 38.6 % (ref 36.0–46.0)
HEMOGLOBIN: 12.7 g/dL (ref 12.0–15.0)
MCH: 27.9 pg (ref 26.0–34.0)
MCHC: 32.9 g/dL (ref 30.0–36.0)
MCV: 84.6 fL (ref 78.0–100.0)
Platelets: 171 10*3/uL (ref 150–400)
RBC: 4.56 MIL/uL (ref 3.87–5.11)
RDW: 13.7 % (ref 11.5–15.5)
WBC: 4.4 10*3/uL (ref 4.0–10.5)

## 2015-02-04 LAB — GLUCOSE, CAPILLARY: GLUCOSE-CAPILLARY: 77 mg/dL (ref 65–99)

## 2015-02-04 LAB — COMPREHENSIVE METABOLIC PANEL
ALT: 14 U/L (ref 14–54)
ANION GAP: 7 (ref 5–15)
AST: 19 U/L (ref 15–41)
Albumin: 3.4 g/dL — ABNORMAL LOW (ref 3.5–5.0)
Alkaline Phosphatase: 57 U/L (ref 38–126)
BUN: 11 mg/dL (ref 6–20)
CHLORIDE: 106 mmol/L (ref 101–111)
CO2: 26 mmol/L (ref 22–32)
Calcium: 8.7 mg/dL — ABNORMAL LOW (ref 8.9–10.3)
Creatinine, Ser: 1.08 mg/dL — ABNORMAL HIGH (ref 0.44–1.00)
GFR, EST AFRICAN AMERICAN: 56 mL/min — AB (ref >60)
GFR, EST NON AFRICAN AMERICAN: 48 mL/min — AB (ref >60)
Glucose, Bld: 104 mg/dL — ABNORMAL HIGH (ref 65–99)
POTASSIUM: 3.5 mmol/L (ref 3.5–5.1)
SODIUM: 139 mmol/L (ref 135–145)
Total Bilirubin: 0.3 mg/dL (ref 0.3–1.2)
Total Protein: 7.1 g/dL (ref 6.5–8.1)

## 2015-02-04 LAB — LIPID PANEL
CHOL/HDL RATIO: 3.9 ratio
Cholesterol: 176 mg/dL (ref 0–200)
HDL: 45 mg/dL (ref >40)
LDL CALC: 123 mg/dL — AB (ref 0–99)
TRIGLYCERIDES: 41 mg/dL (ref <150)
VLDL: 8 mg/dL (ref 0–40)

## 2015-02-04 LAB — TSH: TSH: 1.682 u[IU]/mL (ref 0.350–4.500)

## 2015-02-04 LAB — SEDIMENTATION RATE: SED RATE: 26 mm/h — AB (ref 0–22)

## 2015-02-04 LAB — TROPONIN I: Troponin I: <0.03 ng/mL (ref <0.031)

## 2015-02-04 MED ORDER — ENOXAPARIN SODIUM 40 MG/0.4ML ~~LOC~~ SOLN
40.0000 mg | SUBCUTANEOUS | Status: DC
  Administered 2015-02-04 – 2015-02-05 (×2): 40 mg via SUBCUTANEOUS
  Filled 2015-02-04 (×2): qty 0.4

## 2015-02-04 MED ORDER — MECLIZINE HCL 12.5 MG PO TABS
25.0000 mg | ORAL_TABLET | Freq: Four times a day (QID) | ORAL | Status: DC | PRN
  Administered 2015-02-04: 25 mg via ORAL
  Filled 2015-02-04: qty 2

## 2015-02-04 MED ORDER — ASPIRIN 300 MG RE SUPP: 300.0000 mg | Freq: Every day | RECTAL | Status: DC

## 2015-02-04 MED ORDER — SODIUM CHLORIDE 0.9 % IV SOLN
INTRAVENOUS | Status: AC
  Administered 2015-02-04: via INTRAVENOUS

## 2015-02-04 MED ORDER — STROKE: EARLY STAGES OF RECOVERY BOOK: Freq: Once | Status: DC

## 2015-02-04 MED ORDER — HYDRALAZINE HCL 20 MG/ML IJ SOLN: 10.0000 mg | INTRAMUSCULAR | Status: DC | PRN

## 2015-02-04 MED ORDER — ASPIRIN 325 MG PO TABS
325.0000 mg | ORAL_TABLET | Freq: Every day | ORAL | Status: DC
  Filled 2015-02-04 (×2): qty 1

## 2015-02-04 MED ORDER — WHITE PETROLATUM GEL
Status: AC
  Administered 2015-02-04: 03:00:00
  Filled 2015-02-04: qty 1

## 2015-02-04 NOTE — Progress Notes (Signed)
VASCULAR LAB PRELIMINARY  PRELIMINARY  PRELIMINARY  PRELIMINARY  Carotid duplex completed.    Preliminary report:  1-39% ICA plaquing.  Vertebral artery flow is antegrade.   Lennan Malone, RVT 02/04/2015, 9:32 AM

## 2015-02-04 NOTE — Progress Notes (Signed)
Received from ED, patient A&O x4, gait steady with minimal assist, denies dizziness at present.  Verbalizes understanding of safety precautions, plan of care & oriented to room.

## 2015-02-04 NOTE — Progress Notes (Signed)
OT Cancellation Note  Patient Details Name: Joy Cain MRN: [id number] DOB: [birth year]   Cancelled Treatment:    Reason Eval/Treat Not Completed: Other (comment) (Pt with PT.) OT to reattempt evaluation as schedule permits.  Hortencia Pilar 02/04/2015, 2:07 PM

## 2015-02-04 NOTE — Care Management Note (Signed)
Case Management Note  Patient Details  Name: Kimisha Eunice MRN: [id number] Date of Birth: Jul 09, 1935  Subjective/Objective:                    Action/Plan: Patient was admitted with left sided numbness. Lives at home with spouse. Will follow for discharge needs pending PT/OT evals and physician orders.  Expected Discharge Date:  02/05/15               Expected Discharge Plan:     In-House Referral:     Discharge planning Services     Post Acute Care Choice:    Choice offered to:     DME Arranged:    DME Agency:     HH Arranged:    HH Agency:     Status of Service:  In process, will continue to follow  Medicare Important Message Given:    Date Medicare IM Given:    Medicare IM give by:    Date Additional Medicare IM Given:    Additional Medicare Important Message give by:     If discussed at [place] of Stay Meetings, dates discussed:    Additional Comments:  Rolm Baptise, RN 02/04/2015, 11:16 AM

## 2015-02-04 NOTE — Progress Notes (Signed)
  Echocardiogram 2D Echocardiogram has been performed.  Joy Cain M 02/04/2015, 9:54 AM

## 2015-02-04 NOTE — Progress Notes (Signed)
Vestibular Evaluation (Physical Therapy)  See also comments on PT evaluation. Pt with episodic vertigo (at least 10 episodes over course of ~45 minutes). At times spontaneous, definitely triggered by movement. No associated nausea. No visible nystagmus. + tinnitus Lt ear. Rt and Lt Hallpike-Dix clearly negative (no vertigo). Will have another PT specializing in Vestibular Rehab assess pt 02/05/15.   02/04/15 1616  Vestibular Assessment  General Observation Pt describes upper part of room appears to be spinning around her (moving to the left). Episodes last >1 minute and up to 4 minutes (although pt has difficulty at times reporting when vertigo has stopped and she "just feels lightheaded.") No nystagmus noted throughout  Symptom Behavior  Type of Dizziness Spinning  Frequency of Dizziness multiple times per hour  Duration of Dizziness up to 4 minutes; x 3-4 weeks  Aggravating Factors Spontaneous onset;Activity in general;Sitting with head tilted back;Supine to sit;Sit to stand (worsened with head tilted back x2; and not x 2)  Relieving Factors Closing eyes;Head stationary (but not very effective)  Occulomotor Exam  Occulomotor Alignment Normal  Spontaneous Absent  Gaze-induced Absent  Smooth Pursuits Intact  Vestibulo-Occular Reflex  VOR 1 Head Only (x 1 viewing) x 45 seconds, no symptoms  Auditory  Comments has ringing in Lt ear; worsens with loud environment; worsens with vertigo  Positional Testing  Dix-Hallpike Dix-Hallpike Right;Dix-Hallpike Left  Dix-Hallpike Right  Dix-Hallpike Right Duration no vertigo  Dix-Hallpike Right Symptoms Other (comment) (no vertigo)  Dix-Hallpike Left  Dix-Hallpike Left Duration no vertigo  Dix-Hallpike Left Symptoms (no vertigo)  Cognition  Cognition Orientation Level Oriented x 4  Positional Sensitivities  Sit to Supine 3  Supine to Sitting 4  Up from Right Hallpike 3  Up from Left Hallpike 3    02/04/2015 Barry Brunner, PT Pager:  [phone]

## 2015-02-04 NOTE — Progress Notes (Signed)
[facility] PROGRESS NOTE  Joy Cain:[id number] DOB: [birth year] DOA: 02/03/2015 PCP: PROVIDER NOT IN SYSTEM  Assessment/Plan: Principal Problem:   Left sided numbness - Currently undergoing stroke work up - Neurology on board - PT to evaluate - MRI reports no acute intracranial infarct   Code Status: full Family Communication: none at bedside  Disposition Plan: Per neuro and PT   Consultants:  Neuro  Procedures: Imaging studies pending  Antibiotics:  None  HPI/Subjective: No new complaints.  Objective: Filed Vitals:   02/04/15 1400  BP: 125/73  Pulse: 80  Temp: 98.6 F (37 C)  Resp: 16    Intake/Output Summary (Last 24 hours) at 02/04/15 1450 Last data filed at 02/04/15 0305  Gross per 24 hour  Intake      0 ml  Output    150 ml  Net   -150 ml   Filed Weights   02/04/15 0028  Weight: 66.407 kg (146 lb 6.4 oz)    Exam:   General:  Pt in nad, alert and awake  Cardiovascular: rrr, no mrg  Respiratory: cta bl, no wheezes  Abdomen: soft, NT, ND  Musculoskeletal: no cyanosis or clubbing   Data Reviewed: Basic Metabolic Panel:  Recent Labs Lab 02/03/15 2055 02/03/15 2112 02/04/15 0105  NA 135 141 139  K 3.9 3.9 3.5  CL 101 104 106  CO2 26  --  26  GLUCOSE 98 100* 104*  BUN 13 15 11   CREATININE 1.18* 1.20* 1.08*  CALCIUM 8.8*  --  8.7*   Liver Function Tests:  Recent Labs Lab 02/03/15 2055 02/04/15 0105  AST 22 19  ALT 15 14  ALKPHOS 70 57  BILITOT 0.4 0.3  PROT 8.1 7.1  ALBUMIN 3.8 3.4*   No results for input(s): LIPASE, AMYLASE in the last 168 hours. No results for input(s): AMMONIA in the last 168 hours. CBC:  Recent Labs Lab 02/03/15 2055 02/03/15 2112 02/04/15 0105  WBC 4.6  --  4.4  NEUTROABS 2.2  --   --   HGB 13.4 13.9 12.7  HCT 41.0 41.0 38.6  MCV 85.6  --  84.6  PLT 190  --  171   Cardiac Enzymes:  Recent Labs Lab 02/04/15 0105  TROPONINI <0.03   BNP (last 3 results) No results for  input(s): BNP in the last 8760 hours.  ProBNP (last 3 results) No results for input(s): PROBNP in the last 8760 hours.  CBG:  Recent Labs Lab 02/03/15 2054 02/03/15 2115 02/04/15 0649  GLUCAP 94 105* 77    No results found for this or any previous visit (from the past 240 hour(s)).   Studies: Ct Head Wo Contrast  02/03/2015  CLINICAL DATA:  Code stroke. Right arm numbness for 1 hr, now with right leg numbness. Headache. History of breast cancer. EXAM: CT HEAD WITHOUT CONTRAST TECHNIQUE: Contiguous axial images were obtained from the base of the skull through the vertex without intravenous contrast. COMPARISON:  Brain MRI 01/16/2014, head CT 05/05/2008 FINDINGS: No evidence of territorial infarct. No intracranial hemorrhage, mass effect, or midline shift. Mild chronic small vessel ischemic change. No hydrocephalus. The basilar cisterns are patent. No intracranial fluid collection. Calvarium is intact. Minimal fluid in the right side of sphenoid sinus, similar in degree to prior. Mild mucosal thickening of the ethmoid air cells. The mastoid air cells are well aerated. IMPRESSION: Mild chronic small vessel ischemic change without CT findings of acute intracranial abnormality. These results were called by telephone at the  time of interpretation on 02/03/2015 at 9:19 pm to Dr. Doy Mince , who verbally acknowledged these results. Electronically Signed   By: Jeb Levering M.D.   On: 02/03/2015 21:19   Mr Brain Wo Contrast  02/04/2015  CLINICAL DATA:  Initial evaluation for left-sided numbness with headache. EXAM: MRI HEAD WITHOUT CONTRAST MRA HEAD WITHOUT CONTRAST TECHNIQUE: Multiplanar, multiecho pulse sequences of the brain and surrounding structures were obtained without intravenous contrast. Angiographic images of the head were obtained using MRA technique without contrast. COMPARISON:  Prior CT from 02/03/2015. FINDINGS: MRI HEAD FINDINGS No abnormal foci of restricted diffusion to suggest  acute intracranial infarct. Gray-white matter differentiation maintained. Normal intravascular flow voids are preserved. No acute or chronic intracranial hemorrhage. Age-related cerebral atrophy present. Mild to moderate T2/FLAIR hyperintensity within the periventricular and deep white matter both cerebral hemispheres present, likely related to chronic microvascular ischemic disease, similar to previous. No mass lesion, midline shift, or mass effect. No hydrocephalus. No extra-axial fluid collection. Craniocervical junction within normal limits. Pituitary gland normal. No acute abnormality about the orbits. Minimal right-sided exophthalmos. Mild mucosal thickening within the maxillary sinuses and ethmoidal air cells. Small air-fluid level within the right sphenoid sinus, which may reflect acute sinus disease. Fatty atrophy of the parotid glands noted. Mild scattered opacity within the inferior mastoid air cells, greater on the right. Inner ear structures normal. Bone marrow signal intensity within normal limits. Scalp soft tissues unremarkable. MRA HEAD FINDINGS ANTERIOR CIRCULATION: Visualize the cervical segments of the internal carotid arteries are widely patent with antegrade flow. Petrous, cavernous, and supraclinoid segments are widely patent. A1 segments anterior communicating artery, and anterior cerebral arteries widely patent. M1 segments widely patent without stenosis or occlusion. MCA bifurcations normal. Distal MCA branches fairly symmetric and well opacified bilaterally. POSTERIOR CIRCULATION: Vertebral arteries are widely patent to the vertebrobasilar junction. Left posterior inferior cerebral artery patent. Right posterior inferior cerebral artery not well evaluated on this exam. Basilar artery widely patent. Superior cerebellar and posterior cerebral arteries well opacified bilaterally. No aneurysm or vascular malformation. IMPRESSION: MRI HEAD IMPRESSION: 1. No acute intracranial infarct or other  process identified. 2. Mild to moderate chronic small vessel ischemic disease, similar to prior. 3. Mild mucosal thickening within the paranasal sinuses with air-fluid levels within the right sphenoid sinus, suggesting acute sinus disease. MRA HEAD IMPRESSION: Normal MRA of the intracranial circulation. Electronically Signed   By: Jeannine Boga M.D.   On: 02/04/2015 02:40   Mr Jodene Nam Head/brain Wo Cm  02/04/2015  CLINICAL DATA:  Initial evaluation for left-sided numbness with headache. EXAM: MRI HEAD WITHOUT CONTRAST MRA HEAD WITHOUT CONTRAST TECHNIQUE: Multiplanar, multiecho pulse sequences of the brain and surrounding structures were obtained without intravenous contrast. Angiographic images of the head were obtained using MRA technique without contrast. COMPARISON:  Prior CT from 02/03/2015. FINDINGS: MRI HEAD FINDINGS No abnormal foci of restricted diffusion to suggest acute intracranial infarct. Gray-white matter differentiation maintained. Normal intravascular flow voids are preserved. No acute or chronic intracranial hemorrhage. Age-related cerebral atrophy present. Mild to moderate T2/FLAIR hyperintensity within the periventricular and deep white matter both cerebral hemispheres present, likely related to chronic microvascular ischemic disease, similar to previous. No mass lesion, midline shift, or mass effect. No hydrocephalus. No extra-axial fluid collection. Craniocervical junction within normal limits. Pituitary gland normal. No acute abnormality about the orbits. Minimal right-sided exophthalmos. Mild mucosal thickening within the maxillary sinuses and ethmoidal air cells. Small air-fluid level within the right sphenoid sinus, which may reflect acute sinus disease.  Fatty atrophy of the parotid glands noted. Mild scattered opacity within the inferior mastoid air cells, greater on the right. Inner ear structures normal. Bone marrow signal intensity within normal limits. Scalp soft tissues  unremarkable. MRA HEAD FINDINGS ANTERIOR CIRCULATION: Visualize the cervical segments of the internal carotid arteries are widely patent with antegrade flow. Petrous, cavernous, and supraclinoid segments are widely patent. A1 segments anterior communicating artery, and anterior cerebral arteries widely patent. M1 segments widely patent without stenosis or occlusion. MCA bifurcations normal. Distal MCA branches fairly symmetric and well opacified bilaterally. POSTERIOR CIRCULATION: Vertebral arteries are widely patent to the vertebrobasilar junction. Left posterior inferior cerebral artery patent. Right posterior inferior cerebral artery not well evaluated on this exam. Basilar artery widely patent. Superior cerebellar and posterior cerebral arteries well opacified bilaterally. No aneurysm or vascular malformation. IMPRESSION: MRI HEAD IMPRESSION: 1. No acute intracranial infarct or other process identified. 2. Mild to moderate chronic small vessel ischemic disease, similar to prior. 3. Mild mucosal thickening within the paranasal sinuses with air-fluid levels within the right sphenoid sinus, suggesting acute sinus disease. MRA HEAD IMPRESSION: Normal MRA of the intracranial circulation. Electronically Signed   By: Jeannine Boga M.D.   On: 02/04/2015 02:40    Scheduled Meds: .  stroke: mapping our early stages of recovery book   Does not apply Once  . aspirin  300 mg Rectal Daily   Or  . aspirin  325 mg Oral Daily  . enoxaparin (LOVENOX) injection  40 mg Subcutaneous Q24H   Continuous Infusions: . sodium chloride 50 mL/hr at 02/04/15 0015    Time spent: > 35 minutes  Velvet Bathe  [facility] Pager [phone] If 7PM-7AM, please contact night-coverage at [email or web address], password [facility] 02/04/2015, 2:50 PM  LOS: 1 day

## 2015-02-04 NOTE — Progress Notes (Signed)
PT Cancellation Note  Patient Details Name: Meliya Mcconahy MRN: [id number] DOB: 1935-12-16   Cancelled Treatment:    Reason Eval/Treat Not Completed: Patient at procedure or test/unavailable. Transporter here to take pt to testing   Tynisa Vohs 02/04/2015, 8:58 AM  Pager [phone]

## 2015-02-04 NOTE — Progress Notes (Signed)
Subjective: patient continues to have vertiginous sensation of room spinning from right to left with eyes open, closed, laying still or moving.  She is not nauseated but has no appetite. No HA. Left hand has improved but left foot still feels as if it has decreased sensation.   Objective: Current vital signs: BP 128/6 mmHg  Pulse 60  Temp(Src) 98 F (36.7 C) (Oral)  Resp 16  Ht 5\' 1"  (1.549 m)  Wt 66.407 kg (146 lb 6.4 oz)  BMI 27.68 kg/m2  SpO2 99% Vital signs in last 24 hours: Temp:  [97.8 F (36.6 C)-98.5 F (36.9 C)] 98 F (36.7 C) (10/17 0800) Pulse Rate:  [54-79] 60 (10/17 0800) Resp:  [14-26] 16 (10/17 0800) BP: (112-195)/(6-92) 128/6 mmHg (10/17 0800) SpO2:  [95 %-100 %] 99 % (10/17 0800) Weight:  [66.407 kg (146 lb 6.4 oz)] 66.407 kg (146 lb 6.4 oz) (10/17 0028)  Intake/Output from previous day: 10/16 0701 - 10/17 0700 In: -  Out: 150 [Urine:150] Intake/Output this shift:   Nutritional status: Diet heart healthy/carb modified Room service appropriate?: Yes; Fluid consistency:: Thin  Neurologic Exam: General: NAD Mental Status: Alert, oriented, thought content appropriate.  Speech fluent without evidence of aphasia.  Able to follow 3 step commands without difficulty. Cranial Nerves: II: Visual fields grossly normal, pupils equal, round, reactive to light and accommodation III,IV, VI: ptosis not present, extra-ocular motions intact bilaterally V,VII: smile symmetric, facial light touch sensation normal bilaterally VIII: hearing normal bilaterally IX,X: uvula rises symmetrically XI: bilateral shoulder shrug XII: midline tongue extension without atrophy or fasciculations  Motor: Right : Upper extremity   5/5    Left:     Upper extremity   5/5  Lower extremity   5/5     Lower extremity   5/5 Tone and bulk:normal tone throughout; no atrophy noted Sensory: Pinprick and light touch intact throughout but decreased in the left foot Deep Tendon Reflexes:  Right:  Upper Extremity   Left: Upper extremity   biceps (C-5 to C-6) 2/4   biceps (C-5 to C-6) 2/4 tricep (C7) 2/4    triceps (C7) 2/4 Brachioradialis (C6) 2/4  Brachioradialis (C6) 2/4  Lower Extremity Lower Extremity  No LE DTR Plantars: Right: downgoing   Left: downgoing Cerebellar: normal finger-to-nose,  normal heel-to-shin test     Lab Results: Basic Metabolic Panel:  Recent Labs Lab 02/03/15 2055 02/03/15 2112 02/04/15 0105  NA 135 141 139  K 3.9 3.9 3.5  CL 101 104 106  CO2 26  --  26  GLUCOSE 98 100* 104*  BUN 13 15 11   CREATININE 1.18* 1.20* 1.08*  CALCIUM 8.8*  --  8.7*    Liver Function Tests:  Recent Labs Lab 02/03/15 2055 02/04/15 0105  AST 22 19  ALT 15 14  ALKPHOS 70 57  BILITOT 0.4 0.3  PROT 8.1 7.1  ALBUMIN 3.8 3.4*   No results for input(s): LIPASE, AMYLASE in the last 168 hours. No results for input(s): AMMONIA in the last 168 hours.  CBC:  Recent Labs Lab 02/03/15 2055 02/03/15 2112 02/04/15 0105  WBC 4.6  --  4.4  NEUTROABS 2.2  --   --   HGB 13.4 13.9 12.7  HCT 41.0 41.0 38.6  MCV 85.6  --  84.6  PLT 190  --  171    Cardiac Enzymes:  Recent Labs Lab 02/04/15 0105  TROPONINI <0.03    Lipid Panel:  Recent Labs Lab 02/04/15 0105  CHOL 176  TRIG 41  HDL 45  CHOLHDL 3.9  VLDL 8  LDLCALC 123*    CBG:  Recent Labs Lab 02/03/15 2054 02/03/15 2115 02/04/15 0649  GLUCAP 94 105* 77    Microbiology: No results found for this or any previous visit.  Coagulation Studies:  Recent Labs  02/03/15 2055  LABPROT 15.2  INR 1.18    Imaging: Ct Head Wo Contrast  02/03/2015  CLINICAL DATA:  Code stroke. Right arm numbness for 1 hr, now with right leg numbness. Headache. History of breast cancer. EXAM: CT HEAD WITHOUT CONTRAST TECHNIQUE: Contiguous axial images were obtained from the base of the skull through the vertex without intravenous contrast. COMPARISON:  Brain MRI 01/16/2014, head CT 05/05/2008 FINDINGS: No  evidence of territorial infarct. No intracranial hemorrhage, mass effect, or midline shift. Mild chronic small vessel ischemic change. No hydrocephalus. The basilar cisterns are patent. No intracranial fluid collection. Calvarium is intact. Minimal fluid in the right side of sphenoid sinus, similar in degree to prior. Mild mucosal thickening of the ethmoid air cells. The mastoid air cells are well aerated. IMPRESSION: Mild chronic small vessel ischemic change without CT findings of acute intracranial abnormality. These results were called by telephone at the time of interpretation on 02/03/2015 at 9:19 pm to Dr. Doy Mince , who verbally acknowledged these results. Electronically Signed   By: Jeb Levering M.D.   On: 02/03/2015 21:19   Mr Brain Wo Contrast  02/04/2015  CLINICAL DATA:  Initial evaluation for left-sided numbness with headache. EXAM: MRI HEAD WITHOUT CONTRAST MRA HEAD WITHOUT CONTRAST TECHNIQUE: Multiplanar, multiecho pulse sequences of the brain and surrounding structures were obtained without intravenous contrast. Angiographic images of the head were obtained using MRA technique without contrast. COMPARISON:  Prior CT from 02/03/2015. FINDINGS: MRI HEAD FINDINGS No abnormal foci of restricted diffusion to suggest acute intracranial infarct. Gray-white matter differentiation maintained. Normal intravascular flow voids are preserved. No acute or chronic intracranial hemorrhage. Age-related cerebral atrophy present. Mild to moderate T2/FLAIR hyperintensity within the periventricular and deep white matter both cerebral hemispheres present, likely related to chronic microvascular ischemic disease, similar to previous. No mass lesion, midline shift, or mass effect. No hydrocephalus. No extra-axial fluid collection. Craniocervical junction within normal limits. Pituitary gland normal. No acute abnormality about the orbits. Minimal right-sided exophthalmos. Mild mucosal thickening within the maxillary  sinuses and ethmoidal air cells. Small air-fluid level within the right sphenoid sinus, which may reflect acute sinus disease. Fatty atrophy of the parotid glands noted. Mild scattered opacity within the inferior mastoid air cells, greater on the right. Inner ear structures normal. Bone marrow signal intensity within normal limits. Scalp soft tissues unremarkable. MRA HEAD FINDINGS ANTERIOR CIRCULATION: Visualize the cervical segments of the internal carotid arteries are widely patent with antegrade flow. Petrous, cavernous, and supraclinoid segments are widely patent. A1 segments anterior communicating artery, and anterior cerebral arteries widely patent. M1 segments widely patent without stenosis or occlusion. MCA bifurcations normal. Distal MCA branches fairly symmetric and well opacified bilaterally. POSTERIOR CIRCULATION: Vertebral arteries are widely patent to the vertebrobasilar junction. Left posterior inferior cerebral artery patent. Right posterior inferior cerebral artery not well evaluated on this exam. Basilar artery widely patent. Superior cerebellar and posterior cerebral arteries well opacified bilaterally. No aneurysm or vascular malformation. IMPRESSION: MRI HEAD IMPRESSION: 1. No acute intracranial infarct or other process identified. 2. Mild to moderate chronic small vessel ischemic disease, similar to prior. 3. Mild mucosal thickening within the paranasal sinuses with air-fluid levels within the right  sphenoid sinus, suggesting acute sinus disease. MRA HEAD IMPRESSION: Normal MRA of the intracranial circulation. Electronically Signed   By: Jeannine Boga M.D.   On: 02/04/2015 02:40   Mr Jodene Nam Head/brain Wo Cm  02/04/2015  CLINICAL DATA:  Initial evaluation for left-sided numbness with headache. EXAM: MRI HEAD WITHOUT CONTRAST MRA HEAD WITHOUT CONTRAST TECHNIQUE: Multiplanar, multiecho pulse sequences of the brain and surrounding structures were obtained without intravenous contrast.  Angiographic images of the head were obtained using MRA technique without contrast. COMPARISON:  Prior CT from 02/03/2015. FINDINGS: MRI HEAD FINDINGS No abnormal foci of restricted diffusion to suggest acute intracranial infarct. Gray-white matter differentiation maintained. Normal intravascular flow voids are preserved. No acute or chronic intracranial hemorrhage. Age-related cerebral atrophy present. Mild to moderate T2/FLAIR hyperintensity within the periventricular and deep white matter both cerebral hemispheres present, likely related to chronic microvascular ischemic disease, similar to previous. No mass lesion, midline shift, or mass effect. No hydrocephalus. No extra-axial fluid collection. Craniocervical junction within normal limits. Pituitary gland normal. No acute abnormality about the orbits. Minimal right-sided exophthalmos. Mild mucosal thickening within the maxillary sinuses and ethmoidal air cells. Small air-fluid level within the right sphenoid sinus, which may reflect acute sinus disease. Fatty atrophy of the parotid glands noted. Mild scattered opacity within the inferior mastoid air cells, greater on the right. Inner ear structures normal. Bone marrow signal intensity within normal limits. Scalp soft tissues unremarkable. MRA HEAD FINDINGS ANTERIOR CIRCULATION: Visualize the cervical segments of the internal carotid arteries are widely patent with antegrade flow. Petrous, cavernous, and supraclinoid segments are widely patent. A1 segments anterior communicating artery, and anterior cerebral arteries widely patent. M1 segments widely patent without stenosis or occlusion. MCA bifurcations normal. Distal MCA branches fairly symmetric and well opacified bilaterally. POSTERIOR CIRCULATION: Vertebral arteries are widely patent to the vertebrobasilar junction. Left posterior inferior cerebral artery patent. Right posterior inferior cerebral artery not well evaluated on this exam. Basilar artery widely  patent. Superior cerebellar and posterior cerebral arteries well opacified bilaterally. No aneurysm or vascular malformation. IMPRESSION: MRI HEAD IMPRESSION: 1. No acute intracranial infarct or other process identified. 2. Mild to moderate chronic small vessel ischemic disease, similar to prior. 3. Mild mucosal thickening within the paranasal sinuses with air-fluid levels within the right sphenoid sinus, suggesting acute sinus disease. MRA HEAD IMPRESSION: Normal MRA of the intracranial circulation. Electronically Signed   By: Jeannine Boga M.D.   On: 02/04/2015 02:40    Medications:  Scheduled: .  stroke: mapping our early stages of recovery book   Does not apply Once  . aspirin  300 mg Rectal Daily   Or  . aspirin  325 mg Oral Daily  . enoxaparin (LOVENOX) injection  40 mg Subcutaneous Q24H    Assessment/Plan: 79 YO female with vertigo and left sided paresthesia.  Left hand paresthesia has improved but vertigo and left foot paresthesia remains.  MRI/MRA brain is negative. LDL 123. A1c and echo pending.    Recommend: 1) Continue ASA 2) Vestibular rehab 3) Meclizine PRN 4) finish stroke work up   [employer] PA-C [facility] [phone]  02/04/2015, 8:55 AM

## 2015-02-04 NOTE — Evaluation (Signed)
Physical Therapy Evaluation (see separate note for partial Vestibular Evaluation) Patient Details Name: Joy Cain MRN: [id number] DOB: 1935/12/03 Today's Date: 02/04/2015   History of Present Illness  Presented with several week h/o vertigo (spinning) and new onset Lt sided numbness. MRI/MRA negative (although states Rt posterior cerebral artery not well visualized). PMHx-breast Ca, mastectomy, osteoporosis    Clinical Impression  Pt admitted with above symptoms. Current mobility evaluation incomplete due to severe vertigo with bed mobility and increased distress/anxiety/tearfulness. See separate note re: Vestibular Evaluation (inconclusive and will have another PT with vestibular training see pt). Pt currently with functional limitations due to the deficits listed below (see PT Problem List).  Pt will benefit from skilled PT to increase their independence and safety with mobility to allow discharge to the venue listed below.       Follow Up Recommendations Other (comment) (TBA as vestibular eval completed)    Equipment Recommendations   (TBA)    Recommendations for Other Services OT consult     Precautions / Restrictions Precautions Precautions: Fall      Mobility  Bed Mobility Overal bed mobility: Needs Assistance Bed Mobility: Supine to Sit;Sit to Supine     Supine to sit: Min guard;HOB elevated Sit to supine: Min guard;HOB elevated   General bed mobility comments: holding rail; minguard due to incr vertigo and tearfullness  Transfers                 General transfer comment: unable due to distress/vertigo  Ambulation/Gait             General Gait Details: unable due to Paediatric nurse    Modified Rankin (Stroke Patients Only) Modified Rankin (Stroke Patients Only) Pre-Morbid Rankin Score: Slight disability (episodic vertigo) Modified Rankin: Severe disability     Balance Overall balance  assessment: Needs assistance Sitting-balance support: Bilateral upper extremity supported;Feet unsupported Sitting balance-Leahy Scale: Poor Sitting balance - Comments: leans to rt (appears more for support/comfort than LOB) Postural control: Right lateral lean                                   Pertinent Vitals/Pain Pain Assessment: 0-10 Pain Score: 5  Pain Location: frontal headache (worsens with vertigo) Pain Descriptors / Indicators: Headache;Pressure Pain Intervention(s): Limited activity within patient's tolerance;Monitored during session;Repositioned    Home Living Family/patient expects to be discharged to:: Private residence Living Arrangements: Spouse/significant other Available Help at Discharge: Family;Available 24 hours/day Type of Home: House Home Access: Stairs to enter Entrance Stairs-Rails: Right Entrance Stairs-Number of Steps: 3 Home Layout: Two level;Able to live on main level with bedroom/bathroom Home Equipment: Gilford Rile - 2 wheels;Bedside commode      Prior Function Level of Independence: Independent         Comments: educator, attorney     Hand Dominance   Dominant Hand: Right    Extremity/Trunk Assessment   Upper Extremity Assessment: Defer to OT evaluation           Lower Extremity Assessment: Overall WFL for tasks assessed (full assessment deferred due to severe vertigo)      Cervical / Trunk Assessment: Normal  Communication   Communication: No difficulties  Cognition Arousal/Alertness: Awake/alert Behavior During Therapy: WFL for tasks assessed/performed Overall Cognitive Status: Within Functional Limits for tasks assessed  General Comments General comments (skin integrity, edema, etc.): Partial vestibular evaluation (see separate note)    Exercises        Assessment/Plan    PT Assessment Patient needs continued PT services  PT Diagnosis Difficulty walking   PT Problem List  Decreased activity tolerance;Decreased balance;Decreased mobility;Decreased knowledge of use of DME;Impaired sensation;Pain  PT Treatment Interventions DME instruction;Gait training;Stair training;Functional mobility training;Therapeutic activities;Therapeutic exercise;Balance training;Neuromuscular re-education;Patient/family education;Other (comment) (vestibular rehab)   PT Goals (Current goals can be found in the Care Plan section) Acute Rehab PT Goals Patient Stated Goal: find out what's wrong PT Goal Formulation: With patient Time For Goal Achievement: 02/11/15 Potential to Achieve Goals: Fair    Frequency Min 4X/week   Barriers to discharge        Co-evaluation               End of Session   Activity Tolerance: Treatment limited secondary to medical complications (Comment) (vertigo) Patient left: in bed;with call bell/phone within reach;with bed alarm set;with family/visitor present Nurse Communication: Other (comment) (MD note states meclizine, however not ordered)    Functional Assessment Tool Used: clinical judgement Functional Limitation: Mobility: Walking and moving around Mobility: Walking and Moving Around Current Status (B2841): At least 60 percent but less than 80 percent impaired, limited or restricted Mobility: Walking and Moving Around Goal Status [phone]): At least 1 percent but less than 20 percent impaired, limited or restricted    Time: 1352-1435 PT Time Calculation (min) (ACUTE ONLY): 43 min   Charges:   PT Evaluation $Initial PT Evaluation Tier I: 1 Procedure PT Treatments $Therapeutic Activity: 8-22 mins   PT G Codes:   PT G-Codes **NOT FOR INPATIENT CLASS** Functional Assessment Tool Used: clinical judgement Functional Limitation: Mobility: Walking and moving around Mobility: Walking and Moving Around Current Status (N0272): At least 60 percent but less than 80 percent impaired, limited or restricted Mobility: Walking and Moving Around Goal  Status [phone]): At least 1 percent but less than 20 percent impaired, limited or restricted    Joy Cain 02/04/2015, 2:54 PM Pager [phone]

## 2015-02-05 DIAGNOSIS — H8110 Benign paroxysmal vertigo, unspecified ear: Secondary | ICD-10-CM | POA: Diagnosis not present

## 2015-02-05 DIAGNOSIS — R200 Anesthesia of skin: Secondary | ICD-10-CM | POA: Diagnosis not present

## 2015-02-05 LAB — HEMOGLOBIN A1C
HEMOGLOBIN A1C: 5.7 % — AB (ref 4.8–5.6)
Mean Plasma Glucose: 117 mg/dL

## 2015-02-05 LAB — RAPID URINE DRUG SCREEN, HOSP PERFORMED
Amphetamines: NOT DETECTED
Barbiturates: NOT DETECTED
Benzodiazepines: NOT DETECTED
Cocaine: NOT DETECTED
Opiates: NOT DETECTED
Tetrahydrocannabinol: NOT DETECTED

## 2015-02-05 MED ORDER — MECLIZINE HCL 12.5 MG PO TABS
25.0000 mg | ORAL_TABLET | Freq: Three times a day (TID) | ORAL | Status: DC
  Administered 2015-02-05 (×2): 25 mg via ORAL
  Filled 2015-02-05 (×2): qty 2

## 2015-02-05 MED ORDER — MECLIZINE HCL 25 MG PO TABS: 25.0000 mg | ORAL_TABLET | Freq: Three times a day (TID) | ORAL | Status: DC

## 2015-02-05 MED ORDER — CLOPIDOGREL BISULFATE 75 MG PO TABS: 75.0000 mg | ORAL_TABLET | Freq: Every day | ORAL | Status: DC

## 2015-02-05 NOTE — Discharge Summary (Signed)
Physician Discharge Summary  Joy Cain ZSW:[id number] DOB: April 15, 1936 DOA: 02/03/2015  PCP: PROVIDER NOT IN SYSTEM  Admit date: 02/03/2015 Discharge date: 02/05/2015  Time spent: > 35 minutes  Recommendations for Outpatient Follow-up:  1) PT and OT recommended outpatient PT and OT evaluation and treatment 2) will have secretary set up f/u appointment with ENT specialist for possible vestibulocochlear testing.  Discharge Diagnoses:  Principal Problem:   Left sided numbness Active Problems:   Elevated blood pressure   Discharge Condition: stable  Diet recommendation: Heart healthy  Filed Weights   02/04/15 0028  Weight: 66.407 kg (146 lb 6.4 oz)    History of present illness:  79 year old African-American female with history of breast cancer in remission who presented to the hospital complaining of dizziness.  Hospital Course:  Dizziness/left sided paresthesia - Neurology consulted while patient in house. Patient is allergic to aspirin as such will be discharged on plavix - no acute infarct on work up [employer] try trial of meclizine and set patient up for outpatient PT and OT -We'll also set up follow-up appointment with ENT specialist for vestibulocochlear testing  Procedures:  Echocardiogram: Reporting EF of 60-65% with grade 1 diastolic dysfunction  Carotid US: negative for strictures  MRI reports no acute infarct  Consultations:  Neurology  Discharge Exam: Filed Vitals:   02/05/15 1001  BP: 128/64  Pulse: 64  Temp: 98.4 F (36.9 C)  Resp: 17    General: Patient in no acute distress, alert and awake Cardiovascular: Regular rate and rhythm, no murmurs or rubs Respiratory: Clear to auscultation bilaterally, no wheezes  Discharge Instructions   Discharge Instructions    Call MD for:  difficulty breathing, headache or visual disturbances    Complete by:  As directed      Call MD for:  severe uncontrolled pain    Complete by:  As directed       Call MD for:  temperature >100.4    Complete by:  As directed      Diet - low sodium heart healthy    Complete by:  As directed      Discharge instructions    Complete by:  As directed   Please f/u with your primary care physician in 1-2 weeks or sooner should any new concerns arise.     Increase activity slowly    Complete by:  As directed           Current Discharge Medication List    START taking these medications   Details  clopidogrel (PLAVIX) 75 MG tablet Take 1 tablet (75 mg total) by mouth daily. Qty: 30 tablet, Refills: 0    meclizine (ANTIVERT) 25 MG tablet Take 1 tablet (25 mg total) by mouth every 8 (eight) hours. Qty: 30 tablet, Refills: 0      CONTINUE these medications which have NOT CHANGED   Details  glucosamine-chondroitin 500-400 MG tablet Take 1 tablet by mouth daily as needed ([place]).      STOP taking these medications     ibuprofen (ADVIL,MOTRIN) 200 MG tablet        Allergies  Allergen Reactions  . Aspirin Other (See Comments)    GI TRACT UPSET      The results of significant diagnostics from this hospitalization (including imaging, microbiology, ancillary and laboratory) are listed below for reference.    Significant Diagnostic Studies: Ct Head Wo Contrast  02/03/2015  CLINICAL DATA:  Code stroke. Right arm numbness for 1 hr, now with  right leg numbness. Headache. History of breast cancer. EXAM: CT HEAD WITHOUT CONTRAST TECHNIQUE: Contiguous axial images were obtained from the base of the skull through the vertex without intravenous contrast. COMPARISON:  Brain MRI 01/16/2014, head CT 05/05/2008 FINDINGS: No evidence of territorial infarct. No intracranial hemorrhage, mass effect, or midline shift. Mild chronic small vessel ischemic change. No hydrocephalus. The basilar cisterns are patent. No intracranial fluid collection. Calvarium is intact. Minimal fluid in the right side of sphenoid sinus, similar in degree to prior. Mild mucosal  thickening of the ethmoid air cells. The mastoid air cells are well aerated. IMPRESSION: Mild chronic small vessel ischemic change without CT findings of acute intracranial abnormality. These results were called by telephone at the time of interpretation on 02/03/2015 at 9:19 pm to Dr. Doy Mince , who verbally acknowledged these results. Electronically Signed   By: Jeb Levering M.D.   On: 02/03/2015 21:19   Mr Brain Wo Contrast  02/04/2015  CLINICAL DATA:  Initial evaluation for left-sided numbness with headache. EXAM: MRI HEAD WITHOUT CONTRAST MRA HEAD WITHOUT CONTRAST TECHNIQUE: Multiplanar, multiecho pulse sequences of the brain and surrounding structures were obtained without intravenous contrast. Angiographic images of the head were obtained using MRA technique without contrast. COMPARISON:  Prior CT from 02/03/2015. FINDINGS: MRI HEAD FINDINGS No abnormal foci of restricted diffusion to suggest acute intracranial infarct. Gray-white matter differentiation maintained. Normal intravascular flow voids are preserved. No acute or chronic intracranial hemorrhage. Age-related cerebral atrophy present. Mild to moderate T2/FLAIR hyperintensity within the periventricular and deep white matter both cerebral hemispheres present, likely related to chronic microvascular ischemic disease, similar to previous. No mass lesion, midline shift, or mass effect. No hydrocephalus. No extra-axial fluid collection. Craniocervical junction within normal limits. Pituitary gland normal. No acute abnormality about the orbits. Minimal right-sided exophthalmos. Mild mucosal thickening within the maxillary sinuses and ethmoidal air cells. Small air-fluid level within the right sphenoid sinus, which may reflect acute sinus disease. Fatty atrophy of the parotid glands noted. Mild scattered opacity within the inferior mastoid air cells, greater on the right. Inner ear structures normal. Bone marrow signal intensity within normal limits.  Scalp soft tissues unremarkable. MRA HEAD FINDINGS ANTERIOR CIRCULATION: Visualize the cervical segments of the internal carotid arteries are widely patent with antegrade flow. Petrous, cavernous, and supraclinoid segments are widely patent. A1 segments anterior communicating artery, and anterior cerebral arteries widely patent. M1 segments widely patent without stenosis or occlusion. MCA bifurcations normal. Distal MCA branches fairly symmetric and well opacified bilaterally. POSTERIOR CIRCULATION: Vertebral arteries are widely patent to the vertebrobasilar junction. Left posterior inferior cerebral artery patent. Right posterior inferior cerebral artery not well evaluated on this exam. Basilar artery widely patent. Superior cerebellar and posterior cerebral arteries well opacified bilaterally. No aneurysm or vascular malformation. IMPRESSION: MRI HEAD IMPRESSION: 1. No acute intracranial infarct or other process identified. 2. Mild to moderate chronic small vessel ischemic disease, similar to prior. 3. Mild mucosal thickening within the paranasal sinuses with air-fluid levels within the right sphenoid sinus, suggesting acute sinus disease. MRA HEAD IMPRESSION: Normal MRA of the intracranial circulation. Electronically Signed   By: Jeannine Boga M.D.   On: 02/04/2015 02:40   Mr Jodene Nam Head/brain Wo Cm  02/04/2015  CLINICAL DATA:  Initial evaluation for left-sided numbness with headache. EXAM: MRI HEAD WITHOUT CONTRAST MRA HEAD WITHOUT CONTRAST TECHNIQUE: Multiplanar, multiecho pulse sequences of the brain and surrounding structures were obtained without intravenous contrast. Angiographic images of the head were obtained using MRA technique without  contrast. COMPARISON:  Prior CT from 02/03/2015. FINDINGS: MRI HEAD FINDINGS No abnormal foci of restricted diffusion to suggest acute intracranial infarct. Gray-white matter differentiation maintained. Normal intravascular flow voids are preserved. No acute or  chronic intracranial hemorrhage. Age-related cerebral atrophy present. Mild to moderate T2/FLAIR hyperintensity within the periventricular and deep white matter both cerebral hemispheres present, likely related to chronic microvascular ischemic disease, similar to previous. No mass lesion, midline shift, or mass effect. No hydrocephalus. No extra-axial fluid collection. Craniocervical junction within normal limits. Pituitary gland normal. No acute abnormality about the orbits. Minimal right-sided exophthalmos. Mild mucosal thickening within the maxillary sinuses and ethmoidal air cells. Small air-fluid level within the right sphenoid sinus, which may reflect acute sinus disease. Fatty atrophy of the parotid glands noted. Mild scattered opacity within the inferior mastoid air cells, greater on the right. Inner ear structures normal. Bone marrow signal intensity within normal limits. Scalp soft tissues unremarkable. MRA HEAD FINDINGS ANTERIOR CIRCULATION: Visualize the cervical segments of the internal carotid arteries are widely patent with antegrade flow. Petrous, cavernous, and supraclinoid segments are widely patent. A1 segments anterior communicating artery, and anterior cerebral arteries widely patent. M1 segments widely patent without stenosis or occlusion. MCA bifurcations normal. Distal MCA branches fairly symmetric and well opacified bilaterally. POSTERIOR CIRCULATION: Vertebral arteries are widely patent to the vertebrobasilar junction. Left posterior inferior cerebral artery patent. Right posterior inferior cerebral artery not well evaluated on this exam. Basilar artery widely patent. Superior cerebellar and posterior cerebral arteries well opacified bilaterally. No aneurysm or vascular malformation. IMPRESSION: MRI HEAD IMPRESSION: 1. No acute intracranial infarct or other process identified. 2. Mild to moderate chronic small vessel ischemic disease, similar to prior. 3. Mild mucosal thickening within the  paranasal sinuses with air-fluid levels within the right sphenoid sinus, suggesting acute sinus disease. MRA HEAD IMPRESSION: Normal MRA of the intracranial circulation. Electronically Signed   By: Jeannine Boga M.D.   On: 02/04/2015 02:40    Microbiology: No results found for this or any previous visit (from the past 240 hour(s)).   Labs: Basic Metabolic Panel:  Recent Labs Lab 02/03/15 2055 02/03/15 2112 02/04/15 0105  NA 135 141 139  K 3.9 3.9 3.5  CL 101 104 106  CO2 26  --  26  GLUCOSE 98 100* 104*  BUN 13 15 11   CREATININE 1.18* 1.20* 1.08*  CALCIUM 8.8*  --  8.7*   Liver Function Tests:  Recent Labs Lab 02/03/15 2055 02/04/15 0105  AST 22 19  ALT 15 14  ALKPHOS 70 57  BILITOT 0.4 0.3  PROT 8.1 7.1  ALBUMIN 3.8 3.4*   No results for input(s): LIPASE, AMYLASE in the last 168 hours. No results for input(s): AMMONIA in the last 168 hours. CBC:  Recent Labs Lab 02/03/15 2055 02/03/15 2112 02/04/15 0105  WBC 4.6  --  4.4  NEUTROABS 2.2  --   --   HGB 13.4 13.9 12.7  HCT 41.0 41.0 38.6  MCV 85.6  --  84.6  PLT 190  --  171   Cardiac Enzymes:  Recent Labs Lab 02/04/15 0105  TROPONINI <0.03   BNP: BNP (last 3 results) No results for input(s): BNP in the last 8760 hours.  ProBNP (last 3 results) No results for input(s): PROBNP in the last 8760 hours.  CBG:  Recent Labs Lab 02/03/15 2054 02/03/15 2115 02/04/15 0649  GLUCAP 94 105* 77       Signed:  Velvet Bathe  [facility] 02/05/2015, 2:05  PM

## 2015-02-05 NOTE — Progress Notes (Signed)
Subjective: Now only feels "woozy in the head". No HA, No vertigo, no numbness or weakness.   Objective: Current vital signs: BP 132/67 mmHg  Pulse 67  Temp(Src) 98.1 F (36.7 C) (Oral)  Resp 20  Ht 5\' 1"  (1.549 m)  Wt 66.407 kg (146 lb 6.4 oz)  BMI 27.68 kg/m2  SpO2 93% Vital signs in last 24 hours: Temp:  [98.1 F (36.7 C)-99 F (37.2 C)] 98.1 F (36.7 C) (10/18 0540) Pulse Rate:  [56-80] 67 (10/18 0540) Resp:  [16-20] 20 (10/18 0540) BP: (122-133)/(58-73) 132/67 mmHg (10/18 0540) SpO2:  [93 %-100 %] 93 % (10/18 0540)  Intake/Output from previous day: 10/17 0701 - 10/18 0700 In: 360 [P.O.:360] Out: -  Intake/Output this shift:   Nutritional status: Diet heart healthy/carb modified Room service appropriate?: Yes; Fluid consistency:: Thin  Neurologic Exam: General: NAD Mental Status: Alert, oriented, thought content appropriate.  Speech fluent without evidence of aphasia.  Able to follow 3 step commands without difficulty. Cranial Nerves: II: Discs flat bilaterally; Visual fields grossly normal, pupils equal, round, reactive to light and accommodation III,IV, VI: ptosis not present, extra-ocular motions intact bilaterally V,VII: smile symmetric, facial light touch sensation normal bilaterally VIII: hearing normal bilaterally IX,X: uvula rises symmetrically XI: bilateral shoulder shrug XII: midline tongue extension without atrophy or fasciculations  Motor: Right : Upper extremity   5/5    Left:     Upper extremity   5/5  Lower extremity   5/5     Lower extremity   5/5 Tone and bulk:normal tone throughout; no atrophy noted Sensory: Pinprick and light touch intact throughout, bilaterally Deep Tendon Reflexes:  Right: Upper Extremity   Left: Upper extremity   biceps (C-5 to C-6) 2/4   biceps (C-5 to C-6) 2/4 tricep (C7) 2/4    triceps (C7) 2/4 Brachioradialis (C6) 2/4  Brachioradialis (C6) 2/4  Lower Extremity Lower Extremity  quadriceps (L-2 to L-4) 0/4    quadriceps (L-2 to L-4) 0/4 Achilles (S1) 0/4   Achilles (S1) 0/4      Lab Results: Basic Metabolic Panel:  Recent Labs Lab 02/03/15 2055 02/03/15 2112 02/04/15 0105  NA 135 141 139  K 3.9 3.9 3.5  CL 101 104 106  CO2 26  --  26  GLUCOSE 98 100* 104*  BUN 13 15 11   CREATININE 1.18* 1.20* 1.08*  CALCIUM 8.8*  --  8.7*    Liver Function Tests:  Recent Labs Lab 02/03/15 2055 02/04/15 0105  AST 22 19  ALT 15 14  ALKPHOS 70 57  BILITOT 0.4 0.3  PROT 8.1 7.1  ALBUMIN 3.8 3.4*   No results for input(s): LIPASE, AMYLASE in the last 168 hours. No results for input(s): AMMONIA in the last 168 hours.  CBC:  Recent Labs Lab 02/03/15 2055 02/03/15 2112 02/04/15 0105  WBC 4.6  --  4.4  NEUTROABS 2.2  --   --   HGB 13.4 13.9 12.7  HCT 41.0 41.0 38.6  MCV 85.6  --  84.6  PLT 190  --  171    Cardiac Enzymes:  Recent Labs Lab 02/04/15 0105  TROPONINI <0.03    Lipid Panel:  Recent Labs Lab 02/04/15 0105  CHOL 176  TRIG 41  HDL 45  CHOLHDL 3.9  VLDL 8  LDLCALC 123*    CBG:  Recent Labs Lab 02/03/15 2054 02/03/15 2115 02/04/15 0649  GLUCAP 94 105* 77    Microbiology: No results found for this or any previous visit.  Coagulation Studies:  Recent Labs  02/03/15 2055  LABPROT 15.2  INR 1.18    Imaging: Ct Head Wo Contrast  02/03/2015  CLINICAL DATA:  Code stroke. Right arm numbness for 1 hr, now with right leg numbness. Headache. History of breast cancer. EXAM: CT HEAD WITHOUT CONTRAST TECHNIQUE: Contiguous axial images were obtained from the base of the skull through the vertex without intravenous contrast. COMPARISON:  Brain MRI 01/16/2014, head CT 05/05/2008 FINDINGS: No evidence of territorial infarct. No intracranial hemorrhage, mass effect, or midline shift. Mild chronic small vessel ischemic change. No hydrocephalus. The basilar cisterns are patent. No intracranial fluid collection. Calvarium is intact. Minimal fluid in the right  side of sphenoid sinus, similar in degree to prior. Mild mucosal thickening of the ethmoid air cells. The mastoid air cells are well aerated. IMPRESSION: Mild chronic small vessel ischemic change without CT findings of acute intracranial abnormality. These results were called by telephone at the time of interpretation on 02/03/2015 at 9:19 pm to Dr. Doy Mince , who verbally acknowledged these results. Electronically Signed   By: Jeb Levering M.D.   On: 02/03/2015 21:19   Mr Brain Wo Contrast  02/04/2015  CLINICAL DATA:  Initial evaluation for left-sided numbness with headache. EXAM: MRI HEAD WITHOUT CONTRAST MRA HEAD WITHOUT CONTRAST TECHNIQUE: Multiplanar, multiecho pulse sequences of the brain and surrounding structures were obtained without intravenous contrast. Angiographic images of the head were obtained using MRA technique without contrast. COMPARISON:  Prior CT from 02/03/2015. FINDINGS: MRI HEAD FINDINGS No abnormal foci of restricted diffusion to suggest acute intracranial infarct. Gray-white matter differentiation maintained. Normal intravascular flow voids are preserved. No acute or chronic intracranial hemorrhage. Age-related cerebral atrophy present. Mild to moderate T2/FLAIR hyperintensity within the periventricular and deep white matter both cerebral hemispheres present, likely related to chronic microvascular ischemic disease, similar to previous. No mass lesion, midline shift, or mass effect. No hydrocephalus. No extra-axial fluid collection. Craniocervical junction within normal limits. Pituitary gland normal. No acute abnormality about the orbits. Minimal right-sided exophthalmos. Mild mucosal thickening within the maxillary sinuses and ethmoidal air cells. Small air-fluid level within the right sphenoid sinus, which may reflect acute sinus disease. Fatty atrophy of the parotid glands noted. Mild scattered opacity within the inferior mastoid air cells, greater on the right. Inner ear  structures normal. Bone marrow signal intensity within normal limits. Scalp soft tissues unremarkable. MRA HEAD FINDINGS ANTERIOR CIRCULATION: Visualize the cervical segments of the internal carotid arteries are widely patent with antegrade flow. Petrous, cavernous, and supraclinoid segments are widely patent. A1 segments anterior communicating artery, and anterior cerebral arteries widely patent. M1 segments widely patent without stenosis or occlusion. MCA bifurcations normal. Distal MCA branches fairly symmetric and well opacified bilaterally. POSTERIOR CIRCULATION: Vertebral arteries are widely patent to the vertebrobasilar junction. Left posterior inferior cerebral artery patent. Right posterior inferior cerebral artery not well evaluated on this exam. Basilar artery widely patent. Superior cerebellar and posterior cerebral arteries well opacified bilaterally. No aneurysm or vascular malformation. IMPRESSION: MRI HEAD IMPRESSION: 1. No acute intracranial infarct or other process identified. 2. Mild to moderate chronic small vessel ischemic disease, similar to prior. 3. Mild mucosal thickening within the paranasal sinuses with air-fluid levels within the right sphenoid sinus, suggesting acute sinus disease. MRA HEAD IMPRESSION: Normal MRA of the intracranial circulation. Electronically Signed   By: Jeannine Boga M.D.   On: 02/04/2015 02:40   Mr Jodene Nam Head/brain Wo Cm  02/04/2015  CLINICAL DATA:  Initial evaluation for left-sided numbness  with headache. EXAM: MRI HEAD WITHOUT CONTRAST MRA HEAD WITHOUT CONTRAST TECHNIQUE: Multiplanar, multiecho pulse sequences of the brain and surrounding structures were obtained without intravenous contrast. Angiographic images of the head were obtained using MRA technique without contrast. COMPARISON:  Prior CT from 02/03/2015. FINDINGS: MRI HEAD FINDINGS No abnormal foci of restricted diffusion to suggest acute intracranial infarct. Gray-white matter differentiation  maintained. Normal intravascular flow voids are preserved. No acute or chronic intracranial hemorrhage. Age-related cerebral atrophy present. Mild to moderate T2/FLAIR hyperintensity within the periventricular and deep white matter both cerebral hemispheres present, likely related to chronic microvascular ischemic disease, similar to previous. No mass lesion, midline shift, or mass effect. No hydrocephalus. No extra-axial fluid collection. Craniocervical junction within normal limits. Pituitary gland normal. No acute abnormality about the orbits. Minimal right-sided exophthalmos. Mild mucosal thickening within the maxillary sinuses and ethmoidal air cells. Small air-fluid level within the right sphenoid sinus, which may reflect acute sinus disease. Fatty atrophy of the parotid glands noted. Mild scattered opacity within the inferior mastoid air cells, greater on the right. Inner ear structures normal. Bone marrow signal intensity within normal limits. Scalp soft tissues unremarkable. MRA HEAD FINDINGS ANTERIOR CIRCULATION: Visualize the cervical segments of the internal carotid arteries are widely patent with antegrade flow. Petrous, cavernous, and supraclinoid segments are widely patent. A1 segments anterior communicating artery, and anterior cerebral arteries widely patent. M1 segments widely patent without stenosis or occlusion. MCA bifurcations normal. Distal MCA branches fairly symmetric and well opacified bilaterally. POSTERIOR CIRCULATION: Vertebral arteries are widely patent to the vertebrobasilar junction. Left posterior inferior cerebral artery patent. Right posterior inferior cerebral artery not well evaluated on this exam. Basilar artery widely patent. Superior cerebellar and posterior cerebral arteries well opacified bilaterally. No aneurysm or vascular malformation. IMPRESSION: MRI HEAD IMPRESSION: 1. No acute intracranial infarct or other process identified. 2. Mild to moderate chronic small vessel  ischemic disease, similar to prior. 3. Mild mucosal thickening within the paranasal sinuses with air-fluid levels within the right sphenoid sinus, suggesting acute sinus disease. MRA HEAD IMPRESSION: Normal MRA of the intracranial circulation. Electronically Signed   By: Jeannine Boga M.D.   On: 02/04/2015 02:40    Medications:  Scheduled: .  stroke: mapping our early stages of recovery book   Does not apply Once  . aspirin  300 mg Rectal Daily   Or  . aspirin  325 mg Oral Daily  . enoxaparin (LOVENOX) injection  40 mg Subcutaneous Q24H    Assessment/Plan:  79 YO with left sided numbness and vertigo.  Numbness has resolved and patient no reporting no Vertigo just "woozy".  MRI negative, CD WNL, Echo EF 60-65%, A1c 5.7 and LDL 123. PT has seen patient and performed Marye Round with no nystagmus or vertigo.   At this time no central source of her vertigo and symptoms have resolved. Patient did have benefit from Meclizine but only has received one dose.   Recommend: 1) PT 2) Continue Meclizine as needed.   Neurology will sign off at this point, but remain available for follow-up evaluation if requested.   Etta Quill PA-C [facility] [phone]  02/05/2015, 8:41 AM   I personally participated in this patient's evaluation and management, including formulating above clinical impression and management recommendations.  Rush Farmer M.D. [facility] [phone]

## 2015-02-05 NOTE — Progress Notes (Signed)
Physical Therapy Treatment Patient Details Name: Joy Cain MRN: [id number] DOB: [birth year] Today's Date: 02/05/2015    History of Present Illness Presented with several week h/o vertigo (spinning) and new onset Lt sided numbness. MRI/MRA negative (although states Rt posterior cerebral artery not well visualized). PMHx-breast Ca, mastectomy, osteoporosis    PT Comments    Patient presents with symptoms possibly related to left vestibular hypofunction.  Symptomatic mostly with return to upright following sidelying modified hallpike.  Educated on seated vestibular adaptation exercise and feel follow up outpatient neuro PT for vestibular rehab indicated.  Safe for d/c home with spouse assist.  No current equipment recommendations.  Follow Up Recommendations  Outpatient PT     Equipment Recommendations  None recommended by PT    Recommendations for Other Services       Precautions / Restrictions Precautions Precautions: Fall Restrictions Weight Bearing Restrictions: No    Mobility  Bed Mobility Overal bed mobility: Needs Assistance Bed Mobility: Supine to Sit;Sit to Supine     Supine to sit: Modified independent (Device/Increase time) Sit to supine: Min guard;HOB elevated      Transfers Overall transfer level: Needs assistance Equipment used: None Transfers: Sit to/from Stand Sit to Stand: Modified independent (Device/Increase time)         General transfer comment: from EOB and regular height toilet; min guard for safety due to "wooziness"  Ambulation/Gait Ambulation/Gait assistance: Supervision Ambulation Distance (Feet): 150 Feet Assistive device: None Gait Pattern/deviations: Step-through pattern;Decreased stride length     General Gait Details: mild unsteadiness reported, no LOB   Stairs Stairs: Yes Stairs assistance: Supervision Stair Management: One rail Right;Alternating pattern;Forwards Number of Stairs: 3 General stair comments: slow and  cautious, but without noted instability or increased dizziness symptoms  Wheelchair Mobility    Modified Rankin (Stroke Patients Only)       Balance Overall balance assessment: Needs assistance Sitting-balance support: No upper extremity supported;Feet supported Sitting balance-Leahy Scale: Fair Sitting balance - Comments: doffed/donned socks sitting EOB   Standing balance support: No upper extremity supported;During functional activity Standing balance-Leahy Scale: Good Standing balance comment: see Berg balance assessment, demonstrates fall risk, but compensates with occasional touching surfaces, and only exacerbated by bending over or turning in circle     02/05/15 1117  Balance        Standardized Balance Assessment  Standardized Balance Assessment  Berg Balance Test  Berg Balance Test  Sit to Stand 3  Standing Unsupported 4  Sitting with Back Unsupported but Feet Supported on Floor or Stool 4  Stand to Sit 3  Transfers 3  Standing Unsupported with Eyes Closed 4  Standing Ubsupported with Feet Together 3  From Standing, Reach Forward with Outstretched Arm 4  From Standing Position, Pick up Object from Floor 3  From Standing Position, Turn to Look Behind Over each Shoulder 4  Turn 360 Degrees 1  Standing Unsupported, Alternately Place Feet on Step/Stool 1  Standing Unsupported, One Foot in Front 3  Standing on One Leg 3  Total Score 43                    Cognition Arousal/Alertness: Awake/alert Behavior During Therapy: WFL for tasks assessed/performed Overall Cognitive Status: Within Functional Limits for tasks assessed                      Exercises      General Comments     02/05/15 0001  Vestibular Assessment  General Observation  reports some improvement in symptoms compared to yesterday; less wooziness noted as well as less ringing in left ear; still feels as if tilting to right  Symptom Behavior  Type of Dizziness Lightheadedness   Frequency of Dizziness intermittent  Duration of Dizziness several minutes  Aggravating Factors Activity in general;Supine to sit;Sitting with head tilted back  Relieving Factors Rest;Medication  Occulomotor Exam  Occulomotor Alignment Normal  Spontaneous Absent  Gaze-induced Absent  Head shaking Horizontal Absent  Smooth Pursuits Comment (jerky)  Saccades Intact  Vestibulo-Occular Reflex  VOR 1 Head Only (x 1 viewing) x 30 seconds 3/5 symptoms  VOR to Slow Head Movement Comment (unable to see refixation; symptwith head thurst eyes closed)  VOR Cancellation Normal  Auditory  Comments ringing lessened in left ear, decreased hearing compared to right  Positional Testing  Sidelying Test Sidelying Right;Sidelying Left  Sidelying Right  Sidelying Right Duration 30 sec, no nystagmus, symptomatic with return to upright  Sidelying Right Symptoms No nystagmus  Sidelying Left  Sidelying Left Duration 30 seconds, symptomatic return to upright  Sidelying Left Symptoms No nystagmus  Cognition  Cognition Orientation Level Oriented x 4        Pertinent Vitals/Pain Pain Assessment: No/denies pain Faces Pain Scale: Hurts a little bit    Home Living Family/patient expects to be discharged to:: Private residence Living Arrangements: Spouse/significant other Available Help at Discharge: Family;Available 24 hours/day Type of Home: House Home Access: Stairs to enter Entrance Stairs-Rails: Right Home Layout: Two level;Able to live on main level with bedroom/bathroom Home Equipment: Gilford Rile - 2 wheels;Bedside commode      Prior Function Level of Independence: Independent      Comments: educator, attorney; retired; drives   PT Goals (current goals can now be found in the care plan section) Acute Rehab PT Goals Patient Stated Goal: find out what's wrong Progress towards PT goals: Progressing toward goals    Frequency  Min 4X/week    PT Plan Discharge plan needs to be updated     Co-evaluation             End of Session Equipment Utilized During Treatment: Gait belt Activity Tolerance: Patient tolerated treatment well Patient left: in bed;with call bell/phone within reach;with bed alarm set     Time: 1017-1055 PT Time Calculation (min) (ACUTE ONLY): 38 min  Charges:  $Gait Training: 8-22 mins $Neuromuscular Re-education: 23-37 mins                    G Codes:      Zan Triska,CYNDI 02/13/15, 11:32 AM  Magda Kiel, [place]arlan 02-13-2015

## 2015-02-05 NOTE — Progress Notes (Signed)
Pt d/c to home by car with family. Assessment stable. Prescriptions given. All questions answered. 

## 2015-02-05 NOTE — Progress Notes (Signed)
Occupational Therapy Evaluation Patient Details Name: Joy Cain MRN: [id number] DOB: 25-Jun-1935 Today's Date: 02/05/2015    History of Present Illness Presented with several week h/o vertigo (spinning) and new onset Lt sided numbness. MRI/MRA negative (although states Rt posterior cerebral artery not well visualized). PMHx-breast Ca, mastectomy, osteoporosis   Clinical Impression   Pt admitted with the above diagnoses and presents with below problem list. Pt will benefit from continued acute OT to address the below listed deficits and maximize independence with BADLs prior to d/c to venue below. PTA pt was independent with ADLs. Pt is currently min guard for LB ADLs, functional mobility, and functional transfers. Pt presents with impaired balance due to "wooziness...like I'm leaning to the right." Pt also presents with LUE weakness grossly 4/5 and decreased sensation on palmer and dorsal surfaces of left hand. OT to continue to follow acutely and recommend OP OT at d/c.      Follow Up Recommendations  Supervision/Assistance - 24 hour;Outpatient OT    Equipment Recommendations  None recommended by OT    Recommendations for Other Services       Precautions / Restrictions Precautions Precautions: Fall Restrictions Weight Bearing Restrictions: No      Mobility Bed Mobility Overal bed mobility: Needs Assistance Bed Mobility: Supine to Sit;Sit to Supine     Supine to sit: HOB elevated;Min guard Sit to supine: Min guard;HOB elevated      Transfers Overall transfer level: Needs assistance Equipment used: None Transfers: Sit to/from Stand           General transfer comment: from EOB and regular height toilet; min guard for safety due to "wooziness"    Balance Overall balance assessment: Needs assistance Sitting-balance support: No upper extremity supported;Feet supported Sitting balance-Leahy Scale: Fair Sitting balance - Comments: doffed/donned socks sitting EOB    Standing balance support: No upper extremity supported;During functional activity Standing balance-Leahy Scale: Fair Standing balance comment: stood to complete hair care with intermittent external support of sink                            ADL Overall ADL's : Needs assistance/impaired Eating/Feeding: Set up;Sitting   Grooming: Standing;Min guard   Upper Body Bathing: Set up;Sitting   Lower Body Bathing: Min guard;Sit to/from stand Lower Body Bathing Details (indicate cue type and reason): able to access feet with no difficulty in seated position Upper Body Dressing : Set up;Sitting   Lower Body Dressing: Min guard;Sit to/from stand Lower Body Dressing Details (indicate cue type and reason): able to access feet with no difficulty in seated position Toilet Transfer: Min guard;Ambulation;Regular Toilet;Grab bars Toilet Transfer Details (indicate cue type and reason): toilet seat is higher at home Toileting- Water quality scientist and Hygiene: Min guard;Sit to/from stand   Tub/ Shower Transfer: Min guard;Walk-in shower;Ambulation;3 in 1 Tub/Shower Transfer Details (indicate cue type and reason): discussed using BSC as shower seat at home and having someone nearby at least initially.  Functional mobility during ADLs: Min guard General ADL Comments: Pt min guard for LB ADLs and functional mobility for safety due to wooziness. Upon therapist arrival pt reporting she had just finshed UB/LB bathing sitting EOB, grooming tasks standing at the sink, and completed toileting. Pt reports she had been feeling better earlier this morning but after her morning routine now reporting "wooziness." "I feel like I'm leaning to the right." LUE weakness noted during hair care.     Vision  Perception     Praxis      Pertinent Vitals/Pain Pain Assessment: Faces Faces Pain Scale: Hurts a little bit     Hand Dominance Right   Extremity/Trunk Assessment Upper Extremity  Assessment Upper Extremity Assessment: LUE deficits/detail LUE Deficits / Details: decreased sensation on palmer and dorsal surfaces of left hand from wrist distal. Grossly 4/5 strength in LUE. Noted to take multiple LUE rest breaks during bilateral task of combing/styling hair. Decreased speed when opening containers LUE Sensation: decreased light touch LUE Coordination: decreased fine motor   Lower Extremity Assessment Lower Extremity Assessment: Defer to PT evaluation   Cervical / Trunk Assessment Cervical / Trunk Assessment: Normal   Communication Communication Communication: No difficulties   Cognition Arousal/Alertness: Awake/alert Behavior During Therapy: WFL for tasks assessed/performed Overall Cognitive Status: Within Functional Limits for tasks assessed                     General Comments       Exercises       Shoulder Instructions      Home Living Family/patient expects to be discharged to:: Private residence Living Arrangements: Spouse/significant other Available Help at Discharge: Family;Available 24 hours/day Type of Home: House Home Access: Stairs to enter [employer] of Steps: 3 Entrance Stairs-Rails: Right Home Layout: Two level;Able to live on main level with bedroom/bathroom     Bathroom Shower/Tub: Occupational psychologist: Standard Bathroom Accessibility: Yes   Home Equipment: Environmental consultant - 2 wheels;Bedside commode          Prior Functioning/Environment Level of Independence: Independent        Comments: educator, attorney; retired; drives    OT Diagnosis: Paresis;Other (comment) (impaired balance)   OT Problem List: Decreased strength;Decreased activity tolerance;Impaired balance (sitting and/or standing);Decreased coordination;Decreased knowledge of precautions;Decreased knowledge of use of DME or AE;Impaired sensation;Impaired tone;Impaired UE functional use   OT Treatment/Interventions: Self-care/ADL  training;Therapeutic exercise;DME and/or AE instruction;Therapeutic activities;Patient/family education;Balance training    OT Goals(Current goals can be found in the care plan section) Acute Rehab OT Goals Patient Stated Goal: find out what's wrong OT Goal Formulation: With patient Time For Goal Achievement: 02/12/15 Potential to Achieve Goals: Good ADL Goals Pt Will Perform Grooming: with modified independence;standing Pt Will Perform Upper Body Dressing: with modified independence;sitting Pt Will Perform Lower Body Dressing: with modified independence;sit to/from stand Pt Will Transfer to Toilet: with modified independence;ambulating;regular height toilet Pt Will Perform Toileting - Clothing Manipulation and hygiene: with modified independence;sit to/from stand Pt Will Perform Tub/Shower Transfer: Shower transfer;with modified independence;ambulating;3 in 1 Pt/caregiver will Perform Home Exercise Program: Increased strength;Left upper extremity;With theraband;With theraputty;With written HEP provided  OT Frequency: Min 2X/week   Barriers to D/C:            Co-evaluation              End of Session    Activity Tolerance: Other (comment);Patient tolerated treatment well ("wooziness" ) Patient left: in bed;with call bell/phone within reach;with bed alarm set   Time: [phone] OT Time Calculation (min): 22 min Charges:  OT General Charges $OT Visit: 1 Procedure OT Evaluation $Initial OT Evaluation Tier I: 1 Procedure G-Codes: OT G-codes **NOT FOR INPATIENT CLASS** Functional Assessment Tool Used: clinical judgement Functional Limitation: Self care Self Care Current Status (X7353): At least 1 percent but less than 20 percent impaired, limited or restricted Self Care Goal Status (G9924): At least 1 percent but less than 20 percent impaired, limited or restricted  Hortencia Pilar 02/05/2015, 9:56 AM

## 2015-02-05 NOTE — Care Management Note (Signed)
Case Management Note  Patient Details  Name: Joy Cain MRN: [id number] Date of Birth: 1935/08/14  Subjective/Objective:                    Action/Plan: Spoke with bedside RN, who states that attending MD plans to provide a paper script for outpatient therapies. CM spoke with patient, who has chosen [facility].  Electronic referral was placed.  No additional discharge needs identified at this time.  Expected Discharge Date:  02/05/15               Expected Discharge Plan:  Home/Self Care (Outpatient PT/OT)  In-House Referral:     Discharge planning Services  CM Consult  Post Acute Care Choice:    Choice offered to:     DME Arranged:    DME Agency:     HH Arranged:    [place] Agency:     Status of Service:  Completed, signed off  Medicare Important Message Given:    Date Medicare IM Given:    Medicare IM give by:    Date Additional Medicare IM Given:    Additional Medicare Important Message give by:     If discussed at [place] of Stay Meetings, dates discussed:    Additional Comments:  Rolm Baptise, RN 02/05/2015, 2:22 PM

## 2015-02-05 NOTE — Patient Instructions (Signed)
Gaze Stabilization: Sitting    Keeping eyes on target on wall 5 feet away, tilt head down 15-30 and move head side to side for __30-60__ seconds. Repeat while moving head up and down for __30-60__ seconds. Do __3-5__ sessions per day. Repeat using target on pattern background.  Copyright  VHI. All rights reserved.  Gaze Stabilization: Tip Card  1.Target must remain in focus, not blurry, and appear stationary while head is in motion. 2.Perform exercises with small head movements (45 to either side of midline). 3.Increase speed of head motion so long as target is in focus. 4.If you wear eyeglasses, be sure you can see target through lens (therapist will give specific instructions for bifocal / progressive lenses). 5.These exercises may provoke dizziness or nausea. Work through these symptoms. If too dizzy, slow head movement slightly. Rest between each exercise. 6.Exercises demand concentration; avoid distractions. 7.For safety, perform standing exercises close to a counter, wall, corner, or next to someone.  Copyright  VHI. All rights reserved.

## 2015-02-05 NOTE — Discharge Instructions (Signed)
Vertigo Vertigo means you feel like you or your surroundings are moving when they are not. Vertigo can be dangerous if it occurs when you are at work, driving, or performing difficult activities.  CAUSES  Vertigo occurs when there is a conflict of signals sent to your brain from the visual and sensory systems in your body. There are many different causes of vertigo, including:  Infections, especially in the inner ear.  A bad reaction to a drug or misuse of alcohol and medicines.  Withdrawal from drugs or alcohol.  Rapidly changing positions, such as lying down or rolling over in bed.  A migraine headache.  Decreased blood flow to the brain.  Increased pressure in the brain from a head injury, infection, tumor, or bleeding. SYMPTOMS  You may feel as though the world is spinning around or you are falling to the ground. Because your balance is upset, vertigo can cause nausea and vomiting. You may have involuntary eye movements (nystagmus). DIAGNOSIS  Vertigo is usually diagnosed by physical exam. If the cause of your vertigo is unknown, your caregiver may perform imaging tests, such as an MRI scan (magnetic resonance imaging). TREATMENT  Most cases of vertigo resolve on their own, without treatment. Depending on the cause, your caregiver may prescribe certain medicines. If your vertigo is related to body position issues, your caregiver may recommend movements or procedures to correct the problem. In rare cases, if your vertigo is caused by certain inner ear problems, you may need surgery. HOME CARE INSTRUCTIONS   Follow your caregiver's instructions.  Avoid driving.  Avoid operating heavy machinery.  Avoid performing any tasks that would be dangerous to you or others during a vertigo episode.  Tell your caregiver if you notice that certain medicines seem to be causing your vertigo. Some of the medicines used to treat vertigo episodes can actually make them worse in some people. SEEK  IMMEDIATE MEDICAL CARE IF:   Your medicines do not relieve your vertigo or are making it worse.  You develop problems with talking, walking, weakness, or using your arms, hands, or legs.  You develop severe headaches.  Your nausea or vomiting continues or gets worse.  You develop visual changes.  A family member notices behavioral changes.  Your condition gets worse. MAKE SURE YOU:  Understand these instructions.  Will watch your condition.  Will get help right away if you are not doing well or get worse.   This information is not intended to replace advice given to you by your health care provider. Make sure you discuss any questions you have with your health care provider.   Document Released: 01/14/2005 Document Revised: 06/29/2011 Document Reviewed: 07/30/2014 Elsevier Interactive Patient Education 2016 Elsevier Inc. Dizziness Dizziness is a common problem. It is a feeling of unsteadiness or light-headedness. You may feel like you are about to faint. Dizziness can lead to injury if you stumble or fall. Anyone can become dizzy, but dizziness is more common in older adults. This condition can be caused by a number of things, including medicines, dehydration, or illness. HOME CARE INSTRUCTIONS Taking these steps may help with your condition: Eating and Drinking  Drink enough fluid to keep your urine clear or pale yellow. This helps to keep you from becoming dehydrated. Try to drink more clear fluids, such as water.  Do not drink alcohol.  Limit your caffeine intake if directed by your health care provider.  Limit your salt intake if directed by your health care provider. Activity  Avoid making quick movements.  Rise slowly from chairs and steady yourself until you feel okay.  In the morning, first sit up on the side of the bed. When you feel okay, stand slowly while you hold onto something until you know that your balance is fine.  Move your legs often if you need to  stand in one place for a long time. Tighten and relax your muscles in your legs while you are standing.  Do not drive or operate heavy machinery if you feel dizzy.  Avoid bending down if you feel dizzy. Place items in your home so that they are easy for you to reach without leaning over. Lifestyle  Do not use any tobacco products, including cigarettes, chewing tobacco, or electronic cigarettes. If you need help quitting, ask your health care provider.  Try to reduce your stress level, such as with yoga or meditation. Talk with your health care provider if you need help. General Instructions  Watch your dizziness for any changes.  Take medicines only as directed by your health care provider. Talk with your health care provider if you think that your dizziness is caused by a medicine that you are taking.  Tell a friend or a family member that you are feeling dizzy. If he or she notices any changes in your behavior, have this person call your health care provider.  Keep all follow-up visits as directed by your health care provider. This is important. SEEK MEDICAL CARE IF:  Your dizziness does not go away.  Your dizziness or light-headedness gets worse.  You feel nauseous.  You have reduced hearing.  You have new symptoms.  You are unsteady on your feet or you feel like the room is spinning. SEEK IMMEDIATE MEDICAL CARE IF:  You vomit or have diarrhea and are unable to eat or drink anything.  You have problems talking, walking, swallowing, or using your arms, hands, or legs.  You feel generally weak.  You are not thinking clearly or you have trouble forming sentences. It may take a friend or family member to notice this.  You have chest pain, abdominal pain, shortness of breath, or sweating.  Your vision changes.  You notice any bleeding.  You have a headache.  You have neck pain or a stiff neck.  You have a fever.   This information is not intended to replace advice  given to you by your health care provider. Make sure you discuss any questions you have with your health care provider.   Document Released: 09/30/2000 Document Revised: 08/21/2014 Document Reviewed: 04/02/2014 Elsevier Interactive Patient Education 2016 [place] tablets or capsules What is this medicine? MECLIZINE (MEK li zeen) is an antihistamine. It is used to prevent nausea, vomiting, or dizziness caused by motion sickness. It is also used to prevent and treat vertigo (extreme dizziness or a feeling that you or your surroundings are tilting or spinning around). This medicine may be used for other purposes; ask your health care provider or pharmacist if you have questions. What should I tell my health care provider before I take this medicine? They need to know if you have any of these conditions: -glaucoma -lung or breathing disease, like asthma -problems urinating -prostate disease -stomach or intestine problems -an unusual or allergic reaction to meclizine, other medicines, foods, dyes, or preservatives -pregnant or trying to get pregnant -breast-feeding How should I use this medicine? Take this medicine by mouth with a glass of water. Follow the directions on the prescription  label. If you are using this medicine to prevent motion sickness, take the dose at least 1 hour before travel. If it upsets your stomach, take it with food or milk. Take your doses at regular intervals. Do not take your medicine more often than directed. Talk to your pediatrician regarding the use of this medicine in children. Special care may be needed. Overdosage: If you think you have taken too much of this medicine contact a poison control center or emergency room at once. NOTE: This medicine is only for you. Do not share this medicine with others. What if I miss a dose? If you miss a dose, take it as soon as you can. If it is almost time for your next dose, take only that dose. Do not take  double or extra doses. What may interact with this medicine? Do not take this medicine with any of the following medications: -MAOIs like Carbex, Eldepryl, Marplan, Nardil, and Parnate This medicine may also interact with the following medications: -alcohol -antihistamines for allergy, cough and cold -certain medicines for anxiety or sleep -certain medicines for depression, like amitriptyline, fluoxetine, sertraline -certain medicines for seizures like phenobarbital, primidone -general anesthetics like halothane, isoflurane, methoxyflurane, propofol -local anesthetics like lidocaine, pramoxine, tetracaine -medicines that relax muscles for surgery -narcotic medicines for pain -phenothiazines like chlorpromazine, mesoridazine, prochlorperazine, thioridazine This list may not describe all possible interactions. Give your health care provider a list of all the medicines, herbs, non-prescription drugs, or dietary supplements you use. Also tell them if you smoke, drink alcohol, or use illegal drugs. Some items may interact with your medicine. What should I watch for while using this medicine? Tell your doctor or healthcare professional if your symptoms do not start to get better or if they get worse. You may get drowsy or dizzy. Do not drive, use machinery, or do anything that needs mental alertness until you know how this medicine affects you. Do not stand or sit up quickly, especially if you are an older patient. This reduces the risk of dizzy or fainting spells. Alcohol may interfere with the effect of this medicine. Avoid alcoholic drinks. Your mouth may get dry. Chewing sugarless gum or sucking hard candy, and drinking plenty of water may help. Contact your doctor if the problem does not go away or is severe. This medicine may cause dry eyes and blurred vision. If you wear contact lenses you may feel some discomfort. Lubricating drops may help. See your eye doctor if the problem does not go away  or is severe. What side effects may I notice from receiving this medicine? Side effects that you should report to your doctor or health care professional as soon as possible: -feeling faint or lightheaded, falls -fast, irregular heartbeat Side effects that usually do not require medical attention (report these to your doctor or health care professional if they continue or are bothersome): -constipation -headache -trouble passing urine or change in the amount of urine -trouble sleeping -upset stomach This list may not describe all possible side effects. Call your doctor for medical advice about side effects. You may report side effects to FDA at 1-800-FDA-1088. Where should I keep my medicine? Keep out of the reach of children. Store at room temperature between 15 and 30 degrees C (59 and 86 degrees F). Keep container tightly closed. Throw away any unused medicine after the expiration date. NOTE: This sheet is a summary. It may not cover all possible information. If you have questions about this medicine, talk to  your doctor, pharmacist, or health care provider.    2016, Elsevier/Gold Standard. (2014-10-11 09:20:57)

## 2015-02-08 DIAGNOSIS — R42 Dizziness and giddiness: Secondary | ICD-10-CM | POA: Diagnosis not present

## 2015-02-08 DIAGNOSIS — J019 Acute sinusitis, unspecified: Secondary | ICD-10-CM | POA: Diagnosis not present

## 2015-02-08 DIAGNOSIS — M6281 Muscle weakness (generalized): Secondary | ICD-10-CM | POA: Diagnosis not present

## 2015-02-08 DIAGNOSIS — Z7901 Long term (current) use of anticoagulants: Secondary | ICD-10-CM | POA: Diagnosis not present

## 2015-02-13 ENCOUNTER — Encounter: Payer: Self-pay | Admitting: Neurology

## 2015-02-13 ENCOUNTER — Ambulatory Visit (INDEPENDENT_AMBULATORY_CARE_PROVIDER_SITE_OTHER): Payer: Medicare Other | Admitting: Neurology

## 2015-02-13 ENCOUNTER — Other Ambulatory Visit (INDEPENDENT_AMBULATORY_CARE_PROVIDER_SITE_OTHER): Payer: Medicare Other

## 2015-02-13 VITALS — BP 160/80 | HR 77 | Ht 61.0 in | Wt 148.6 lb

## 2015-02-13 DIAGNOSIS — I639 Cerebral infarction, unspecified: Secondary | ICD-10-CM

## 2015-02-13 DIAGNOSIS — R202 Paresthesia of skin: Secondary | ICD-10-CM

## 2015-02-13 DIAGNOSIS — I951 Orthostatic hypotension: Secondary | ICD-10-CM

## 2015-02-13 DIAGNOSIS — G609 Hereditary and idiopathic neuropathy, unspecified: Secondary | ICD-10-CM

## 2015-02-13 DIAGNOSIS — R209 Unspecified disturbances of skin sensation: Secondary | ICD-10-CM | POA: Diagnosis not present

## 2015-02-13 LAB — VITAMIN B12: Vitamin B-12: 245 pg/mL (ref 211–911)

## 2015-02-13 NOTE — Progress Notes (Signed)
Joy Cain   Date: 02/13/2015  Joy Cain MRN: [id number] DOB: 1935-09-21   Dear Dr. Chapman Fitch:   Thank you for your kind referral of Joy Cain for consultation of paresthesias of the hands and left leg. Although her history is well known to you, please allow [place] to reiterate it for the purpose of our medical record. The patient was accompanied to the clinic by husband who also provides collateral information.     History of Present Illness: [name] is a 79 y.o. right-handed African American female with left breast cancer s/p mastectomy (1986), asthma, GERD, arthritis presenting for evaluation of paresthesias of the left side and dizziness.    Starting around early 2016, she began experiencing dizzy spells, described as room-spinning, which was short-lived and intermittent at first, but in mid-October, the dizziness became more constant and lasting all day which prompted her to go to the emergency department. It was triggered by quick positional changes, such as bending.  She has noticed anything that helps, including meclizine.  She is scheduled to see ENT next week.  She has not done vestibular therapy.      Around the same time last week, she developed left hand numbness and left anterior lower leg and dorsum of the foot.  Symptoms last several hours but can return without identifiable triggers.  She feels her left arm and leg is fatigued and tired.  She is not dropping items or having falls.   She was admitted to [facility] on 10/16 - 02/05/2015 for dizziness and left sided paresthesias.  Neurology was consulted and recommended TIA evaluation which returned negative.  MRI/A brain, echo, and US carotids was normal.  She was started on plavix 34m daily due to aspirin allergy for secondary risk prevention and encouraged to see ENT as an out-patient.   She was also recently diagnosed with sinusitis and given a  prescription for antibiotics, but has not started it yet.  No history of diabetes, alcohol, or chemotherapy.   Out-side paper records, electronic medical record, and images have been reviewed where available and summarized as:  MRI/A brain 02/04/2015: MRI HEAD IMPRESSION: 1. No acute intracranial infarct or other process identified. 2. Mild to moderate chronic small vessel ischemic disease, similar to prior. 3. Mild mucosal thickening within the paranasal sinuses with air-fluid levels within the right sphenoid sinus, suggesting acute sinus disease.   MRA HEAD IMPRESSION:  Normal MRA of the intracranial circulation.   Echo 02/04/2015: normal   [place]arotids 02/04/2015:  Bilateral: intimal wall thickening CCA. 1-39% ICA plaquing.  Vertebral artery flow is antegrade.  Labs 02/04/2015:  HbA1c 5.7, ESR 26, TSH 1.68  Past Medical History  Diagnosis Date  [place]   . Asthma   . Osteoporosis   . GERD (gastroesophageal reflux disease)   . Dysphagia     Past Surgical History  Procedure Laterality Date  . Bunionectomy    . Hammer toe surgery    . Mastectomy       Medications:  Outpatient Encounter Prescriptions as of 02/13/2015  Medication Sig Note  . azithromycin (ZITHROMAX) 250 MG tablet  02/13/2015: Received from: External Pharmacy  . clopidogrel (PLAVIX) 75 MG tablet Take 1 tablet (75 mg total) by mouth daily.   .Marland Kitchenglucosamine-chondroitin 500-400 MG tablet Take 1 tablet by mouth daily as needed ([place].   .Marland Kitchenmeclizine (ANTIVERT) 25 MG tablet Take 1 tablet (25 mg total) by mouth  every 8 (eight) hours.    No facility-administered encounter medications on file as of 02/13/2015.     Allergies:  Allergies  Allergen Reactions  . Aspirin Other (See Comments)    GI TRACT UPSET    Family History: Family History  Problem Relation Age of Onset  . Cancer Sister   . Cancer Brother   . Stroke Father     Deceased, 27  . Heart disease Mother     Social  History: Social History  Substance Use Topics  . Smoking status: Never Smoker   . Smokeless tobacco: Never Used  . Alcohol Use: No   Social History   Social History Narrative   Lives with husband in a 2 story home.  Has 3 children.    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      Review of Systems:  CONSTITUTIONAL: No fevers, chills, night sweats, or weight loss.   EYES: No visual changes or eye pain ENT: No hearing changes.  No history of nose bleeds.   RESPIRATORY: No cough, wheezing and shortness of breath.   CARDIOVASCULAR: Negative for chest pain, and palpitations.   GI: Negative for abdominal discomfort, blood in stools or black stools.  No recent change in bowel habits.   GU:  No history of incontinence.   MUSCLOSKELETAL: +history of joint pain or swelling.  No myalgias.   SKIN: Negative for lesions, rash, and itching.   HEMATOLOGY/ONCOLOGY: Negative for prolonged bleeding, bruising easily, and swollen nodes.  +history of cancer.   ENDOCRINE: Negative for cold or heat intolerance, polydipsia or goiter.   PSYCH:  No depression or anxiety symptoms.   NEURO: As Above.   Vital Signs:  BP 160/80 mmHg  Pulse 77  Ht '5\' 1"'  (1.549 m)  Wt 148 lb 9 oz (67.388 kg)  BMI 28.09 kg/m2  SpO2 98% Orthostatic VS for the past 24 hrs:  BP- Lying Pulse- Lying BP- Sitting Pulse- Sitting BP- Standing at 0 minutes Pulse- Standing at 0 minutes  02/13/15 1037 140/78 mmHg 63 120/78 mmHg 65 110/70 mmHg 66   Pain Scale: 0 on a scale of 0-10   General Medical Exam:   General:  Well appearing, comfortable.   Eyes/ENT: see cranial nerve examination.   Neck: No masses appreciated.  Full range of motion without tenderness.  No carotid bruits. Respiratory:  Clear to auscultation, good air entry bilaterally.   Cardiac:  Regular rate and rhythm, no murmur.   Extremities:  Toe deformities due to arthritis, edema, or skin discoloration.  Skin:  No rashes or lesions.  Neurological Exam: MENTAL  STATUS including orientation to time, place, person, recent and remote memory, attention span and concentration, language, and fund of knowledge is normal.  Speech is not dysarthric.  CRANIAL NERVES: II:  No visual field defects.  Unremarkable fundi.   III-IV-VI: Pupils equal round and reactive to light.  Normal conjugate, extra-ocular eye movements in all directions of gaze.  No nystagmus.  Subtle right ptosis.   V:  Normal facial sensation.   VII:  Normal facial symmetry and movements.  No pathologic facial reflexes.  VIII:  Normal hearing and vestibular function.   IX-X:  Normal palatal movement.   XI:  Normal shoulder shrug and head rotation.   XII:  Normal tongue strength and range of motion, no deviation or fasciculation.  MOTOR:  No atrophy, fasciculations or abnormal movements.  No pronator drift.  Tone is normal.    Right Upper Extremity:  Left Upper Extremity:    Deltoid  5/5   Deltoid  5/5   Biceps  5/5   Biceps  5/5   Triceps  5/5   Triceps  5/5   Wrist extensors  5/5   Wrist extensors  5/5   Wrist flexors  5/5   Wrist flexors  5/5   Finger extensors  5/5   Finger extensors  5/5   Finger flexors  5/5   Finger flexors  5/5   Dorsal interossei  5/5   Dorsal interossei  5/5   Abductor pollicis  5/5   Abductor pollicis  5/5   Tone (Ashworth scale)  0  Tone (Ashworth scale)  0   Right Lower Extremity:    Left Lower Extremity:    Hip flexors  5/5   Hip flexors  5/5   Hip extensors  5/5   Hip extensors  5/5   Knee flexors  5/5   Knee flexors  5/5   Knee extensors  5/5   Knee extensors  5/5   Dorsiflexors  5/5   Dorsiflexors  5/5   Plantarflexors  5/5   Plantarflexors  5/5   Toe extensors  5/5   Toe extensors  5/5   Toe flexors  5/5   Toe flexors  5/5   Tone (Ashworth scale)  0  Tone (Ashworth scale)  0   MSRs:  Right                                                                 Left brachioradialis 2+  brachioradialis 2+  biceps 2+  biceps 2+  triceps 1+  triceps 1+    patellar 0  Patellar 0  ankle jerk 0  ankle jerk 0  Hoffman no  Hoffman no  plantar response mute  plantar response mute   SENSORY:  Vibration reduced at ankles bilaterally, worse on left.  Pin prick reduced over the left anterolateral leg and foot, intact on the right.  Temperature intact.  Proprioception impaired bilaterally.  Romberg's sign present.   COORDINATION/GAIT: Normal finger-to- nose-finger.  Intact rapid alternating movements bilaterally. Gait narrow based and stable.  She is very unsteady with tandem gait and unable to perform stressed gait.   IMPRESSION: Mrs. Gernert is a 79 year-old female presenting for evaluation of dizziness and left sided paresthesias.  Her lightheadedness may be more consistent with orthostasis her SBP dropped 30 points from supine to standing and did reproduce symptoms.   She was encouraged to stay well-hydrated and start using compression stockings.  Regarding her left sided paresthesias, the abrupt nature of her symptoms is unusual for neuropathy, so EMG will be performed to better characterize the nature of these symptoms.  I reassured her that there is no evidence of anything worrisome on her MRI/A brain to explain symptoms. Her exam is more consistent with peripheral neuropathy affecting the legs.  No deficits were apparent in the hands.  TIA is unlikely to last such a prolonged period of time, but for stroke prevention, reasonable to continue plavix.  PLAN/RECOMMENDATIONS:  1.  EMG of the left arm and leg 2.  Check vitamin B12 , vitamin B6, copper, ceruloplasmin, SPEP/UPEP with IFE  3.  Encouraged to stay well-hydrated, make positional changes slowly 4.  Start using knee high compression stockings  Return to clinic in 3 months.   The duration of this appointment Cain was 45 minutes of face-to-face time with the patient.  Greater than 50% of this time was spent in counseling, explanation of diagnosis, planning of further management, and  coordination of care.   Thank you for allowing me to participate in patient's care.  If I can answer any additional questions, I would be pleased to do so.    Sincerely,    Donika K. Posey Pronto, DO

## 2015-02-13 NOTE — Progress Notes (Signed)
Note routed

## 2015-02-13 NOTE — Patient Instructions (Signed)
1.  Check blood work 2.  EMG of the left arm and leg 3.  Return to clinic in 3 months

## 2015-02-15 LAB — UIFE/LIGHT CHAINS/TP QN, 24-HR UR
ALBUMIN, U: DETECTED
ALPHA 1 UR: DETECTED — AB
Alpha 2, Urine: DETECTED — AB
Beta, Urine: DETECTED — AB
GAMMA UR: DETECTED — AB
TOTAL PROTEIN, URINE-UPE24: 6 mg/dL (ref 5–24)

## 2015-02-15 LAB — SPEP & IFE WITH QIG
ALBUMIN ELP: 3.7 g/dL — AB (ref 3.8–4.8)
ALPHA-1-GLOBULIN: 0.3 g/dL (ref 0.2–0.3)
Alpha-2-Globulin: 0.7 g/dL (ref 0.5–0.9)
Beta 2: 0.4 g/dL (ref 0.2–0.5)
Beta Globulin: 0.4 g/dL (ref 0.4–0.6)
Gamma Globulin: 2.1 g/dL — ABNORMAL HIGH (ref 0.8–1.7)
IGA: 184 mg/dL (ref 69–380)
IGG (IMMUNOGLOBIN G), SERUM: 2640 mg/dL — AB (ref 690–1700)
IGM, SERUM: 53 mg/dL (ref 52–322)
TOTAL PROTEIN, SERUM ELECTROPHOR: 7.5 g/dL (ref 6.1–8.1)

## 2015-02-15 LAB — CERULOPLASMIN: Ceruloplasmin: 26 mg/dL (ref 18–53)

## 2015-02-16 LAB — COPPER, SERUM: Copper: 95 ug/dL (ref 70–175)

## 2015-02-17 LAB — VITAMIN B6: Vitamin B6: 9.3 ng/mL (ref 2.1–21.7)

## 2015-02-18 ENCOUNTER — Telehealth: Payer: Self-pay | Admitting: Hematology and Oncology

## 2015-02-18 NOTE — Telephone Encounter (Signed)
Patient called in to reschedule her appointment °

## 2015-02-19 ENCOUNTER — Ambulatory Visit: Payer: PRIVATE HEALTH INSURANCE | Admitting: Hematology and Oncology

## 2015-02-19 ENCOUNTER — Ambulatory Visit (INDEPENDENT_AMBULATORY_CARE_PROVIDER_SITE_OTHER): Payer: Medicare Other

## 2015-02-19 DIAGNOSIS — E538 Deficiency of other specified B group vitamins: Secondary | ICD-10-CM | POA: Diagnosis not present

## 2015-02-19 DIAGNOSIS — R42 Dizziness and giddiness: Secondary | ICD-10-CM | POA: Diagnosis not present

## 2015-02-19 DIAGNOSIS — H9312 Tinnitus, left ear: Secondary | ICD-10-CM | POA: Diagnosis not present

## 2015-02-19 DIAGNOSIS — J324 Chronic pansinusitis: Secondary | ICD-10-CM | POA: Diagnosis not present

## 2015-02-19 DIAGNOSIS — H903 Sensorineural hearing loss, bilateral: Secondary | ICD-10-CM | POA: Diagnosis not present

## 2015-02-19 MED ORDER — CYANOCOBALAMIN 1000 MCG/ML IJ SOLN
1000.0000 ug | Freq: Once | INTRAMUSCULAR | Status: AC
  Administered 2015-02-19: 1000 ug via INTRAMUSCULAR

## 2015-02-19 NOTE — Progress Notes (Signed)
Pt was administered B12 in left deltoid. Pt tolerated well. Will return tomorrow for 2nd injection.

## 2015-02-20 ENCOUNTER — Ambulatory Visit (INDEPENDENT_AMBULATORY_CARE_PROVIDER_SITE_OTHER): Payer: Medicare Other

## 2015-02-20 DIAGNOSIS — E538 Deficiency of other specified B group vitamins: Secondary | ICD-10-CM | POA: Diagnosis not present

## 2015-02-20 MED ORDER — CYANOCOBALAMIN 1000 MCG/ML IJ SOLN
1000.0000 ug | Freq: Once | INTRAMUSCULAR | Status: AC
  Administered 2015-02-20: 1000 ug via INTRAMUSCULAR

## 2015-02-20 NOTE — Assessment & Plan Note (Signed)
Left breast cancer diagnosed and treated in [place] probably stage I we do not know the receptor status the patient had a mastectomy. She reports that she did not require any antiestrogen therapy or radiation afterwards. She has been followed routinely with bone scans one to 2006 apparently. After that she has been only father with mammograms. She has been at [facility] until recently moved to [place] area and wanted to set up oncology care with [place]. Recently she had an episode of dizziness and headache for which she had brain MRI which was normal.  Breast Cancer Surveillance: 1. Breast exam 02/21/2015: Normal 2. Mammogram to be done December 2016 Recurrent headaches: Brain MRI done recently was normal apart from chronic small vessel ischemic changes  Plan: Recommended annual surveillance follow-up with survivorship clinic

## 2015-02-20 NOTE — Progress Notes (Signed)
Pt was administered B12 injection in left detoid. Pt tolerated well.

## 2015-02-21 ENCOUNTER — Encounter: Payer: Self-pay | Admitting: Hematology and Oncology

## 2015-02-21 ENCOUNTER — Ambulatory Visit (HOSPITAL_BASED_OUTPATIENT_CLINIC_OR_DEPARTMENT_OTHER): Payer: Medicare Other | Admitting: Hematology and Oncology

## 2015-02-21 ENCOUNTER — Ambulatory Visit (INDEPENDENT_AMBULATORY_CARE_PROVIDER_SITE_OTHER): Payer: Medicare Other | Admitting: *Deleted

## 2015-02-21 ENCOUNTER — Telehealth: Payer: Self-pay | Admitting: Hematology and Oncology

## 2015-02-21 VITALS — BP 129/72 | HR 81 | Temp 98.1°F | Resp 18 | Ht 61.0 in | Wt 149.8 lb

## 2015-02-21 DIAGNOSIS — C50512 Malignant neoplasm of lower-outer quadrant of left female breast: Secondary | ICD-10-CM

## 2015-02-21 DIAGNOSIS — E538 Deficiency of other specified B group vitamins: Secondary | ICD-10-CM

## 2015-02-21 DIAGNOSIS — H8110 Benign paroxysmal vertigo, unspecified ear: Secondary | ICD-10-CM

## 2015-02-21 DIAGNOSIS — R42 Dizziness and giddiness: Secondary | ICD-10-CM | POA: Diagnosis not present

## 2015-02-21 DIAGNOSIS — H9312 Tinnitus, left ear: Secondary | ICD-10-CM

## 2015-02-21 DIAGNOSIS — Z853 Personal history of malignant neoplasm of breast: Secondary | ICD-10-CM | POA: Diagnosis not present

## 2015-02-21 DIAGNOSIS — H8112 Benign paroxysmal vertigo, left ear: Secondary | ICD-10-CM

## 2015-02-21 MED ORDER — CYANOCOBALAMIN 1000 MCG/ML IJ SOLN: 1000.0000 ug | Freq: Once | INTRAMUSCULAR | Status: DC

## 2015-02-21 MED ORDER — CLINDAMYCIN HCL 300 MG PO CAPS: 300.0000 mg | ORAL_CAPSULE | Freq: Three times a day (TID) | ORAL | Status: DC

## 2015-02-21 MED ORDER — CYANOCOBALAMIN 1000 MCG/ML IJ SOLN
1000.0000 ug | Freq: Once | INTRAMUSCULAR | Status: AC
  Administered 2015-02-21: 1000 ug via INTRAMUSCULAR

## 2015-02-21 NOTE — Addendum Note (Signed)
Addended by: Prentiss Bells on: 02/21/2015 11:44 AM   Modules accepted: Orders, Medications

## 2015-02-21 NOTE — Progress Notes (Signed)
Patient Care Team: Antony Blackbird, MD as PCP - General (Family Medicine)  DIAGNOSIS: breast cancer diagnosed in 1986.  CHIEF COMPLIANT: vertigo and dizziness  INTERVAL HISTORY: Joy Cain is a 79 year old with above-mentioned history of breast cancer that was diagnosed in 1986 treated with mastectomy and did not require adjuvant therapy. She has been monitored annually after she moved from [place] to [place]. She was at [facility] for a while and then she finally decided that she wants to be seen closer to home. She started seeing [place] in 2015. She reports her major complaint is of dizziness and vertigo. She has was recently admitted to the hospital and had neurology and when evaluating her. She brain MRI which was normal. She had physical therapy try to reposition her autoliths but was unsuccessful. She is frustrated about this.  REVIEW OF SYSTEMS:   Constitutional: Denies fevers, chills or abnormal weight loss Eyes: Denies blurriness of vision Ears, nose, mouth, throat, and face: Denies mucositis or sore throat Respiratory: Denies cough, dyspnea or wheezes Cardiovascular: Denies palpitation, chest discomfort or lower extremity swelling Gastrointestinal:  Denies nausea, heartburn or change in bowel habits Skin: Denies abnormal skin rashes Lymphatics: Denies new lymphadenopathy or easy bruising Neurological:vertigo and dizziness Behavioral/Psych: Mood is stable, no new changes  Breast:  denies any pain or lumps or nodules in either breasts All other systems were reviewed with the patient and are negative.  I have reviewed the past medical history, past surgical history, social history and family history with the patient and they are unchanged from previous note.  ALLERGIES:  is allergic to aspirin.  MEDICATIONS:  Current Outpatient Prescriptions  Medication Sig Dispense Refill  . azithromycin (ZITHROMAX) 250 MG tablet     . clopidogrel (PLAVIX) 75 MG tablet Take 1 tablet (75 mg total) by  mouth daily. 30 tablet 0  . glucosamine-chondroitin 500-400 MG tablet Take 1 tablet by mouth daily as needed ([place]).    Marland Kitchen meclizine (ANTIVERT) 25 MG tablet Take 1 tablet (25 mg total) by mouth every 8 (eight) hours. 30 tablet 0   No current facility-administered medications for this visit.    PHYSICAL EXAMINATION: ECOG PERFORMANCE STATUS: 1 - Symptomatic but completely ambulatory  Filed Vitals:   02/21/15 1020  BP: 129/72  Pulse: 81  Temp: 98.1 F (36.7 C)  Resp: 18   Filed Weights   02/21/15 1020  Weight: 149 lb 12.8 oz (67.949 kg)    GENERAL:alert, no distress and comfortable SKIN: skin color, texture, turgor are normal, no rashes or significant lesions EYES: normal, Conjunctiva are pink and non-injected, sclera clear OROPHARYNX:no exudate, no erythema and lips, buccal mucosa, and tongue normal  NECK: supple, thyroid normal size, non-tender, without nodularity LYMPH:  no palpable lymphadenopathy in the cervical, axillary or inguinal LUNGS: clear to auscultation and percussion with normal breathing effort HEART: regular rate & rhythm and no murmurs and no lower extremity edema ABDOMEN:abdomen soft, non-tender and normal bowel sounds Musculoskeletal:no cyanosis of digits and no clubbing  NEURO: alert & oriented x 3 with fluent speech, no focal motor/sensory deficits BREAST: No palpable masses or nodules in either right breast or chest wall on the left. No palpable axillary supraclavicular or infraclavicular adenopathy no breast tenderness or nipple discharge. (exam performed in the presence of a chaperone)  LABORATORY DATA:  I have reviewed the data as listed   Chemistry      Component Value Date/Time   NA 139 02/04/2015 0105   K 3.5 02/04/2015 0105  CL 106 02/04/2015 0105   CO2 26 02/04/2015 0105   BUN 11 02/04/2015 0105   CREATININE 1.08* 02/04/2015 0105      Component Value Date/Time   CALCIUM 8.7* 02/04/2015 0105   ALKPHOS 57 02/04/2015 0105   AST  19 02/04/2015 0105   ALT 14 02/04/2015 0105   BILITOT 0.3 02/04/2015 0105       Lab Results  Component Value Date   WBC 4.4 02/04/2015   HGB 12.7 02/04/2015   HCT 38.6 02/04/2015   MCV 84.6 02/04/2015   PLT 171 02/04/2015   NEUTROABS 2.2 02/03/2015    ASSESSMENT & PLAN:  Breast cancer of lower-outer quadrant of left female breast Left breast cancer diagnosed and treated in [place] probably stage I we do not know the receptor status the patient had a mastectomy. She reports that she did not require any antiestrogen therapy or radiation afterwards. She has been followed routinely with bone scans one to 2006 apparently. After that she has had surveillance with mammograms. She has been at [facility] and moved to [place] area set up oncology care with [place].   Dizziness and vertigo: Recent brain MRI was normal. She is on Antivert. She was also started on B-12 injections. She is very unhappy about the fact that her dizziness and vertigo are not improving with anything. She has been evaluated by different specialists for this.   Breast Cancer Surveillance: 1. Breast exam 02/21/2015: Normal 2. Mammogram to be done December 2016 Recurrent headaches: Brain MRI done recently was normal apart from chronic small vessel ischemic changes  Plan: Recommended annual surveillance follow-up with survivorship clinic   No orders of the defined types were placed in this encounter.   The patient has a good understanding of the overall plan. she agrees with it. she will call with any problems that may develop before the next visit here.   Rulon Eisenmenger, MD 02/21/2015

## 2015-02-21 NOTE — Telephone Encounter (Signed)
Gave patient avs report and appointments for November 2017.

## 2015-02-21 NOTE — Telephone Encounter (Signed)
Duplicate

## 2015-02-21 NOTE — Progress Notes (Signed)
Patient in for B12 injection. 

## 2015-02-22 ENCOUNTER — Ambulatory Visit (INDEPENDENT_AMBULATORY_CARE_PROVIDER_SITE_OTHER): Payer: Medicare Other | Admitting: *Deleted

## 2015-02-22 DIAGNOSIS — E538 Deficiency of other specified B group vitamins: Secondary | ICD-10-CM

## 2015-02-22 MED ORDER — CYANOCOBALAMIN 1000 MCG/ML IJ SOLN
1000.0000 ug | Freq: Once | INTRAMUSCULAR | Status: AC
  Administered 2015-02-22: 1000 ug via INTRAMUSCULAR

## 2015-02-22 NOTE — Progress Notes (Signed)
Patient in for B12 injection. 

## 2015-02-25 ENCOUNTER — Ambulatory Visit (INDEPENDENT_AMBULATORY_CARE_PROVIDER_SITE_OTHER): Payer: Medicare Other | Admitting: *Deleted

## 2015-02-25 DIAGNOSIS — E538 Deficiency of other specified B group vitamins: Secondary | ICD-10-CM

## 2015-02-25 MED ORDER — CYANOCOBALAMIN 1000 MCG/ML IJ SOLN
1000.0000 ug | Freq: Once | INTRAMUSCULAR | Status: AC
  Administered 2015-02-25: 1000 ug via INTRAMUSCULAR

## 2015-02-25 NOTE — Progress Notes (Signed)
Patient in for B12 injection. 

## 2015-02-26 ENCOUNTER — Ambulatory Visit (INDEPENDENT_AMBULATORY_CARE_PROVIDER_SITE_OTHER): Payer: Medicare Other | Admitting: *Deleted

## 2015-02-26 ENCOUNTER — Encounter: Payer: Self-pay | Admitting: *Deleted

## 2015-02-26 ENCOUNTER — Ambulatory Visit (INDEPENDENT_AMBULATORY_CARE_PROVIDER_SITE_OTHER): Payer: Medicare Other | Admitting: Neurology

## 2015-02-26 DIAGNOSIS — E538 Deficiency of other specified B group vitamins: Secondary | ICD-10-CM

## 2015-02-26 DIAGNOSIS — R202 Paresthesia of skin: Secondary | ICD-10-CM | POA: Diagnosis not present

## 2015-02-26 DIAGNOSIS — G609 Hereditary and idiopathic neuropathy, unspecified: Secondary | ICD-10-CM

## 2015-02-26 MED ORDER — CYANOCOBALAMIN 1000 MCG/ML IJ SOLN
1000.0000 ug | Freq: Once | INTRAMUSCULAR | Status: AC
  Administered 2015-02-26: 1000 ug via INTRAMUSCULAR

## 2015-02-26 NOTE — Procedures (Signed)
[facility] Neurology  [place], [address]  [place], [place] [address] Tel: [phone] Fax:  [phone] Test Date:  02/26/2015  Patient: Joy Cain DOB: December 25, 1935 Physician: Narda Amber, DO  Sex: Female Height: 5\' 1"  Ref Phys: Narda Amber, DO  ID#: [id number]   Technician: Jerilynn Mages. Dean   Patient Complaints: This is a 79 year-old female presenting for evaluation of generalized paresthesias and weakness, worse on the left.  NCV & EMG Findings: Extensive electrodiagnostic testing of the left upper and lower extremity shows: 1. Left median sensory response is reduced in amplitude. Left radial and ulnar sensory responses are within normal limits. 2. Left median and ulnar motor responses are within normal limits. 3. Left sural and superficial peroneal sensory responses are absent. 4. Left peroneal motor response is reduced at the extensor digitorum brevis, however within normal limits when recording at the tibialis anterior. The tibial motor responses also reduced. 5. There is no evidence of active or chronic motor axon loss changes affecting the muscles of the upper extremity. 6. In the lower extremity, chronic motor axon loss changes are seen involving the muscles below the knee without accompanied active denervation.  Impression: The electrophysiologic findings are most consistent with a length dependent sensorimotor polyneuropathy, axon loss in type, affecting the left side.   ___________________________ Narda Amber, DO    Nerve Conduction Studies Anti Sensory Summary Table   Site NR Peak (ms) Norm Peak (ms) P-T Amp (V) Norm P-T Amp  Left Median Anti Sensory (2nd Digit)  32.4C  Wrist    3.4 <3.8 4.9 >10  Left Radial Anti Sensory (Base 1st Digit)  32.4C  Wrist    2.6 <2.8 15.6 >10  Left Sup Peroneal Anti Sensory (Ant Lat Mall)  32.4C  12 cm NR  <4.6  >3  Left Sural Anti Sensory (Lat Mall)  32.4C  Calf NR  <4.6  >3  Left Ulnar Anti Sensory (5th Digit)  32.4C    Wrist    3.1 <3.2 13.0 >5   Motor Summary Table   Site NR Onset (ms) Norm Onset (ms) O-P Amp (mV) Norm O-P Amp Site1 Site2 Delta-0 (ms) Dist (cm) Vel (m/s) Norm Vel (m/s)  Left Median Motor (Abd Poll Brev)  32.4C  Wrist    3.8 <4.0 7.6 >5 Elbow Wrist 4.4 23.0 52 >50  Elbow    8.2  6.9         Left Peroneal Motor (Ext Dig Brev)  32.4C  Ankle    3.2 <6.0 2.1 >2.5 B Fib Ankle 7.1 31.0 44 >40  B Fib    10.3  2.0  Poplt B Fib 1.6 10.0 63 >40  Poplt    11.9  1.9         Left Peroneal TA Motor (Tib Ant)  32.4C  Fib Head    2.3 <4.5 3.2 >3 Poplit Fib Head 1.7 10.0 59 >40  Poplit    4.0  2.8         Left Tibial Motor (Abd Hall Brev)  32.4C  Ankle    3.6 <6.0 3.5 >4 Knee Ankle 8.7 38.0 44 >40  Knee    12.3  1.8         Left Ulnar Motor (Abd Dig Minimi)  32.4C  Wrist    3.0 <3.1 7.1 >7 B Elbow Wrist 3.4 18.0 53 >50  B Elbow    6.4  7.1  A Elbow B Elbow 1.9 10.0 53 >50  A Elbow  8.3  7.0          EMG   Side Muscle Ins Act Fibs Psw Fasc Number Recrt Dur Dur. Amp Amp. Poly Poly. Comment  Left AntTibialis Nml Nml Nml Nml 1- Mod-R Some 1+ Some 1+ Nml Nml N/A  Left PronatorTeres Nml Nml Nml Nml Nml Nml Nml Nml Nml Nml Nml Nml N/A  Left Biceps Nml Nml Nml Nml Nml Nml Nml Nml Nml Nml Nml Nml N/A  Left Triceps Nml Nml Nml Nml Nml Nml Nml Nml Nml Nml Nml Nml N/A  Left Deltoid Nml Nml Nml Nml Nml Nml Nml Nml Nml Nml Nml Nml N/A  Left Gastroc Nml Nml Nml Nml 1- Rapid Some 1+ Some 1+ Nml Nml N/A  Left Flex Dig Long Nml Nml Nml Nml 1- Rapid Some 1+ Some 1+ Nml Nml N/A  Left RectFemoris Nml Nml Nml Nml Nml Nml Nml Nml Nml Nml Nml Nml N/A  Left GluteusMed Nml Nml Nml Nml Nml Nml Nml Nml Nml Nml Nml Nml N/A  Left BicepsFemS Nml Nml Nml Nml Nml Nml Nml Nml Nml Nml Nml Nml N/A  Left 1stDorInt Nml Nml Nml Nml Nml Nml Nml Nml Nml Nml Nml Nml N/A  Left Ext Indicis Nml Nml Nml Nml Nml Nml Nml Nml Nml Nml Nml Nml N/A      Waveforms:

## 2015-02-26 NOTE — Progress Notes (Signed)
Patient in for B12 injection. 

## 2015-02-26 NOTE — Progress Notes (Signed)
[place] Psychosocial Distress Screening Clinical Social Work  Clinical Social Work was referred by distress screening protocol.  The patient scored a 10 on the Psychosocial Distress Thermometer which indicates severe distress. Clinical Social Worker reviewed chart to assess for distress and other psychosocial needs. Pt is 20 years out from breast cancer diagnosis and was here for follow up visit. She has concerns about neurological issues and NOT cancer related concerns. Pt does not return to [facility] for one year and has follow up arranged for her neurology concerns. No need for CSW follow up at this time.   ONCBCN DISTRESS SCREENING 02/21/2015  Screening Type Initial Screening  Distress experienced in past week (1-10) 10  Information Concerns Type Lack of info about diagnosis  Physical Problem type Tingling hands/feet;Skin dry/itchy;Other (comment)  Physician notified of physical symptoms Yes  Referral to clinical psychology No  Referral to clinical social work Yes  Referral to dietition No  Referral to financial advocate No  Referral to support programs No  Referral to palliative care No    Clinical Social Worker follow up needed: No.  If yes, follow up plan: Loren Racer, [place]  [facility] Phone: [phone] Fax: [phone]

## 2015-02-27 ENCOUNTER — Ambulatory Visit (INDEPENDENT_AMBULATORY_CARE_PROVIDER_SITE_OTHER): Payer: Medicare Other | Admitting: *Deleted

## 2015-02-27 DIAGNOSIS — E538 Deficiency of other specified B group vitamins: Secondary | ICD-10-CM | POA: Diagnosis not present

## 2015-02-27 MED ORDER — CYANOCOBALAMIN 1000 MCG/ML IJ SOLN
1000.0000 ug | Freq: Once | INTRAMUSCULAR | Status: AC
  Administered 2015-02-27: 1000 ug via INTRAMUSCULAR

## 2015-02-27 NOTE — Progress Notes (Signed)
Patient in for B12 injection. 

## 2015-03-06 ENCOUNTER — Ambulatory Visit (INDEPENDENT_AMBULATORY_CARE_PROVIDER_SITE_OTHER): Payer: Medicare Other

## 2015-03-06 DIAGNOSIS — E538 Deficiency of other specified B group vitamins: Secondary | ICD-10-CM

## 2015-03-06 MED ORDER — CYANOCOBALAMIN 1000 MCG/ML IJ SOLN
1000.0000 ug | Freq: Once | INTRAMUSCULAR | Status: AC
  Administered 2015-03-06: 1000 ug via INTRAMUSCULAR

## 2015-03-06 NOTE — Progress Notes (Signed)
Pt presented to clinic for B12 injection. Administered in Left deltoid. Tolerated well. Will set up next weeks injection prior to leaving.

## 2015-03-13 ENCOUNTER — Ambulatory Visit (INDEPENDENT_AMBULATORY_CARE_PROVIDER_SITE_OTHER): Payer: Medicare Other | Admitting: Neurology

## 2015-03-13 DIAGNOSIS — E538 Deficiency of other specified B group vitamins: Secondary | ICD-10-CM | POA: Diagnosis not present

## 2015-03-13 MED ORDER — CYANOCOBALAMIN 1000 MCG/ML IJ SOLN
1000.0000 ug | Freq: Once | INTRAMUSCULAR | Status: AC
  Administered 2015-03-13: 1000 ug via INTRAMUSCULAR

## 2015-03-13 NOTE — Progress Notes (Signed)
B12 injection to left deltoid with no apparent complications.   

## 2015-03-18 ENCOUNTER — Encounter: Payer: Medicare Other | Admitting: Occupational Therapy

## 2015-03-18 ENCOUNTER — Ambulatory Visit: Payer: Medicare Other | Admitting: Physical Therapy

## 2015-03-19 DIAGNOSIS — H6983 Other specified disorders of Eustachian tube, bilateral: Secondary | ICD-10-CM | POA: Diagnosis not present

## 2015-03-19 DIAGNOSIS — R42 Dizziness and giddiness: Secondary | ICD-10-CM | POA: Diagnosis not present

## 2015-03-20 ENCOUNTER — Ambulatory Visit (INDEPENDENT_AMBULATORY_CARE_PROVIDER_SITE_OTHER): Payer: Medicare Other | Admitting: *Deleted

## 2015-03-20 DIAGNOSIS — E538 Deficiency of other specified B group vitamins: Secondary | ICD-10-CM | POA: Diagnosis not present

## 2015-03-20 MED ORDER — CYANOCOBALAMIN 1000 MCG/ML IJ SOLN
1000.0000 ug | Freq: Once | INTRAMUSCULAR | Status: AC
  Administered 2015-03-20: 1000 ug via INTRAMUSCULAR

## 2015-03-20 NOTE — Progress Notes (Signed)
Patient in for B12 injection. 

## 2015-03-25 ENCOUNTER — Ambulatory Visit: Payer: Medicare Other | Admitting: Physical Therapy

## 2015-03-26 ENCOUNTER — Ambulatory Visit: Payer: Medicare Other

## 2015-03-26 ENCOUNTER — Ambulatory Visit: Payer: Medicare Other | Admitting: Occupational Therapy

## 2015-04-09 ENCOUNTER — Encounter: Payer: Self-pay | Admitting: Physical Therapy

## 2015-04-09 ENCOUNTER — Ambulatory Visit: Payer: Medicare Other | Admitting: Physical Therapy

## 2015-04-09 ENCOUNTER — Ambulatory Visit: Payer: Medicare Other | Attending: Family Medicine | Admitting: Occupational Therapy

## 2015-04-09 DIAGNOSIS — R269 Unspecified abnormalities of gait and mobility: Secondary | ICD-10-CM

## 2015-04-09 DIAGNOSIS — R278 Other lack of coordination: Secondary | ICD-10-CM

## 2015-04-09 DIAGNOSIS — Z7409 Other reduced mobility: Secondary | ICD-10-CM | POA: Diagnosis not present

## 2015-04-09 DIAGNOSIS — R2681 Unsteadiness on feet: Secondary | ICD-10-CM | POA: Diagnosis not present

## 2015-04-09 DIAGNOSIS — R42 Dizziness and giddiness: Secondary | ICD-10-CM | POA: Insufficient documentation

## 2015-04-09 NOTE — Therapy (Signed)
[facility] [address] [place], [place], [address] Phone: [phone]   Fax:  [phone]  Occupational Therapy Evaluation  Patient Details  Name: Joy Cain MRN: [id number] Date of Birth: 1935-10-01 Referring Provider: Dr. Antony Blackbird   Encounter Date: 04/09/2015      OT End of Session - 04/09/15 1057    Visit Number 1   Date for OT Re-Evaluation --  n/a - one time eval   Authorization Type MCR   OT Start Time 1015   OT Stop Time 1055   OT Time Calculation (min) 40 min   Activity Tolerance Patient tolerated treatment well      Past Medical History  Diagnosis Date  . Breast cancer ([place])   . Asthma   . Osteoporosis   . GERD (gastroesophageal reflux disease)   . Dysphagia     Past Surgical History  Procedure Laterality Date  . Bunionectomy    . Hammer toe surgery    . Mastectomy      There were no vitals filed for this visit.  Visit Diagnosis:  Impaired functional mobility, balance, and endurance - Plan: Ot plan of care cert/re-cert      Subjective Assessment - 04/09/15 1020    Subjective  My coordination is off with balance   Pertinent History h/o breast CA, Arthritis   Patient Stated Goals Work on my balance (no specific O.T. goals)   Currently in Pain? No/denies           [facility] OT Assessment - 04/09/15 1023    Assessment   Diagnosis CVA  However MRI ruled out CVA   Referring Provider Dr. Antony Blackbird    Onset Date 02/03/15   Prior Therapy none   Precautions   Precautions Fall   Balance Screen   Has the patient fallen in the past 6 months No   Has the patient had a decrease in activity level because of a fear of falling?  No   Is the patient reluctant to leave their home because of a fear of falling?  No   Home  Environment   Writer;Door   Additional Comments Pt lives in 2 story home, but lives on 1st floor (BR and bath on 1st floor). Three steps to  enter   Lives With Spouse   Prior Function   Level of [place] Retired   Leisure bowling 3 times per week; plays poker   ADL   ADL comments Pt performs all BADLS independently. Pt also reports returning to all IADLS independently   Mobility   Mobility Status Independent   Written Expression   Dominant Hand Right   Handwriting --  denies change   Vision - History   Baseline Vision Wears glasses all the time   Visual History --  ? cataracts   Vision Assessment   Ocular Range of Motion Within Functional Limits   Tracking/Visual Pursuits Able to track stimulus in all quads without difficulty   Convergence Impaired (comment)  slightly Lt eye   Comment denies change   Cognition   Memory Appears intact   Sensation   Additional Comments Pt had initial numbness LUE, but reports has resolved now   Coordination   9 Hole Peg Test Right;Left   Right 9 Hole Peg Test 25.22 sec   Left 9 Hole Peg Test 26.25 sec   Edema   Edema none   ROM / Strength   AROM /  PROM / Strength AROM;Strength   AROM   Overall AROM Comments BUE AROM WNL's (noted arthritis bilateral hands)   Strength   Overall Strength Comments BUE MMT grossly 5/5, except LUE abduction 3+/5 due to old rotator cuff injury (pt has opted not to have surgery)   Hand Function   Right Hand Grip (lbs) 42, 49 lbs   Left Hand Grip (lbs) 45, 44 lbs           Vestibular Assessment - 05-02-2015 0001    Symptom Behavior   Type of Dizziness Imbalance   Frequency of Dizziness "comes unexpectedly"   Aggravating Factors Comment  Pt unsure   Relieving Factors Rest   Occulomotor Exam   Occulomotor Alignment Normal   Spontaneous Absent   Gaze-induced Absent   Smooth Pursuits Saccades   Saccades Intact  with exception of undershooting from inferior > superior   Comment (-) Head Thrust and asymptomatic.   Vestibulo-Occular Reflex   VOR Cancellation Corrective saccades  to L side; asymptomatic                                   Plan - 2015/05/02 1202    Clinical Impression Statement Pt is a 79 y.o. female who presented to outpatient rehab for O.T. Evaluation, s/p Lt sided hemiparesis with admission to hospital 02/03/15. However, MRI r/o CVA, although did show small vessel ischemic changes. Pt reports all numbness on Lt side has resolved and back to prior level of function with all daily tasks.    Pt will benefit from skilled therapeutic intervention in order to improve on the following deficits (Retired) --  P.T. to address mild balance deficits   OT Frequency One time visit   Plan No f/u O.T. recommended at this time - one time evaluation for this episode of care   Consulted and Agree with Plan of Care Patient          G-Codes - 05-02-2015 1211    Functional Limitation Self care   Self Care Current Status [phone]) 0 percent impaired, limited or restricted   Self Care Goal Status [id number]) 0 percent impaired, limited or restricted   Self Care Discharge Status [phone]) 0 percent impaired, limited or restricted      Problem List Patient Active Problem List   Diagnosis Date Noted  . CVA (cerebral infarction) 02/03/2015  . Left sided numbness 02/03/2015  . Elevated blood pressure 02/03/2015  . Breast cancer of lower-outer quadrant of left female breast ([place]) 02/13/2014    Carey Bullocks, OTR/L 05-02-15, 12:13 PM  [facility] [address] [place], [place], [address] Phone: [phone]   Fax:  [phone]  Name: Joy Cain MRN: [id number] Date of Birth: [birth year]

## 2015-04-09 NOTE — Therapy (Signed)
[facility] [address] [place], [place], [address] Phone: [phone]   Fax:  [phone]  Physical Therapy Evaluation  Patient Details  Name: Joy Cain MRN: [id number] Date of Birth: 09-12-1935 Referring Provider: Antony Blackbird, MD (Referred by hospitalist; pt requesting notes to PCP)  Encounter Date: 04/09/2015      PT End of Session - 04/09/15 1815    Visit Number 1   Number of Visits 9  eval + 8 visits   Date for PT Re-Evaluation 06/08/15   Authorization Type MCR trad primary - GCodes required   PT Start Time 0934   PT Stop Time 1015   PT Time Calculation (min) 41 min   Activity Tolerance Patient tolerated treatment well   Behavior During Therapy [facility] for tasks assessed/performed      Past Medical History  Diagnosis Date  . Breast cancer ([place])   . Asthma   . Osteoporosis   . GERD (gastroesophageal reflux disease)   . Dysphagia     Past Surgical History  Procedure Laterality Date  . Bunionectomy    . Hammer toe surgery    . Mastectomy      There were no vitals filed for this visit.  Visit Diagnosis:  Abnormality of gait - Plan: PT plan of care cert/re-cert  Unsteadiness - Plan: PT plan of care cert/re-cert  Disequilibrium - Plan: PT plan of care cert/re-cert  Coordination abnormal - Plan: PT plan of care cert/re-cert      Subjective Assessment - 04/09/15 0937    Subjective Pt hospitalized from 10/16 - 02/05/15 due to acute onset of numbness in L arm and L leg (below knee) as well as dizziness. Pt initially given meclizine and discharged home. Pt referred to ENT specialist, who treated patientfor R sinus infection. At this time, pt's dizziness has resolved (no longer taking meclizine); however, pt continues to note ongoing balance impairment, which pt describes as "wobbling to the left".    Pertinent History Goes by "Joy Cain". PMH: breast cancer s/p L mastectomy (currently in remission),  hereditary and idiopathic peripheral neuropathy, asthma, osteoporosis, GERD, mild chronic small vessel ischemic change    Patient Stated Goals "I want to find out if I need physical therapy; and if so, maybe you can help me with my balance."   Currently in Pain? No/denies            [facility] PT Assessment - 04/09/15 0001    Assessment   Medical Diagnosis CVA  per MD referral; however, MRI negative for acute abnormality   Referring Provider Antony Blackbird, MD  Referred by hospitalist; pt requesting notes to PCP   Onset Date/Surgical Date 02/03/15   Precautions   Precautions Fall   Restrictions   Weight Bearing Restrictions No   Balance Screen   Has the patient fallen in the past 6 months No   Has the patient had a decrease in activity level because of a fear of falling?  No   Is the patient reluctant to leave their home because of a fear of falling?  No   Home Environment   Living Environment Private residence   Living Arrangements Spouse/significant other   Available Help at Discharge Family   Type of [place] to enter   Entrance Stairs-Number of Steps 3   [place] One level  bedroom on frst level; very seldom go [place] None  Additional Comments husband has walker from prior hip replacement surgery   Prior Function   Level of Independence Independent   Vocation Retired   Leisure bowls 3 times per week; plays poker   Cognition   Overall Cognitive Status Within Functional Limits for tasks assessed   Sensation   Light Touch Appears Intact   Proprioception Appears Intact   Additional Comments Nerve conduction study performed.   Coordination   Gross Motor Movements are Fluid and Coordinated Yes   Heel Shin Test Excursion grossly symmetrical; however, quality of movement on RLE > LLE.   ROM / Strength   AROM / PROM / Strength Strength   Strength   Overall Strength Deficits   Overall Strength Comments  BLE grossly WBL, with exception of L hip grossly 4-/5 to 4/5 in all planes.   Transfers   Transfers Sit to Stand;Stand to Sit   Sit to Stand 7: Independent   Stand to Sit 7: Independent   Functional Gait  Assessment   Gait assessed  Yes   Gait Level Surface Walks 20 ft in less than 7 sec but greater than 5.5 sec, uses assistive device, slower speed, mild gait deviations, or deviates 6-10 in outside of the 12 in walkway width.   Change in Gait Speed Able to smoothly change walking speed without loss of balance or gait deviation. Deviate no more than 6 in outside of the 12 in walkway width.   Gait with Horizontal Head Turns Performs head turns with moderate changes in gait velocity, slows down, deviates 10-15 in outside 12 in walkway width but recovers, can continue to walk.   Gait with Vertical Head Turns Performs task with slight change in gait velocity (eg, minor disruption to smooth gait path), deviates 6 - 10 in outside 12 in walkway width or uses assistive device   Gait and Pivot Turn Pivot turns safely in greater than 3 sec and stops with no loss of balance, or pivot turns safely within 3 sec and stops with mild imbalance, requires small steps to catch balance.   Step Over Obstacle Is able to step over one shoe box (4.5 in total height) without changing gait speed. No evidence of imbalance.   Gait with Narrow Base of Support Ambulates less than 4 steps heel to toe or cannot perform without assistance.   Gait with Eyes Closed Walks 20 ft, uses assistive device, slower speed, mild gait deviations, deviates 6-10 in outside 12 in walkway width. Ambulates 20 ft in less than 9 sec but greater than 7 sec.   Ambulating Backwards Walks 20 ft, uses assistive device, slower speed, mild gait deviations, deviates 6-10 in outside 12 in walkway width.   Steps Alternating feet, must use rail.   Total Score 18            Vestibular Assessment - 04/09/15 0001    Symptom Behavior   Type of Dizziness  Imbalance   Frequency of Dizziness "comes unexpectedly"   Aggravating Factors Comment  Pt unsure   Relieving Factors Rest   Occulomotor Exam   Occulomotor Alignment Normal   Spontaneous Absent   Gaze-induced Absent   Smooth Pursuits Saccades   Saccades Intact  with exception of undershooting from inferior > superior   Comment (-) B Head Thrust Test and asymptomatic.   Vestibulo-Occular Reflex   VOR Cancellation Corrective saccades  to L side; asymptomatic               [facility] Adult PT Treatment/Exercise -  05/09/15 0001    Ambulation/Gait   Ambulation/Gait Yes   Ambulation/Gait Assistance 5: Supervision;6: Modified independent (Device/Increase time)   Ambulation/Gait Assistance Details Supervision required for gait with functional head turns (see FGA).   Ambulation Distance (Feet) 400 Feet   Assistive device None   Gait Pattern Step-through pattern;Decreased arm swing - right;Decreased arm swing - left;Narrow base of support;Left foot flat   Ambulation Surface Level;Indoor   Gait velocity 3.05 ft/sec   Stairs --                PT Education - May 09, 2015 1809    Education provided Yes   Education Details PT eval findings, goals, and POC.   Person(s) Educated Patient   Methods Explanation   Comprehension Verbalized understanding          PT Short Term Goals - 2015-05-09 1825    PT SHORT TERM GOAL #1   Title STG's = LTG's           PT Long Term Goals - 05-09-2015 1825    PT LONG TERM GOAL #1   Title Pt will perform initial HEP with mod I using paper handout to maximize functional gains made in PT. Target date: 06/04/15   PT LONG TERM GOAL #2   Title Perform SOT to assess pt ability to utilize multi-sensory input for balance. Target date: 06/04/15   PT LONG TERM GOAL #3   Title Improve SOT composite score by 8 points from baseline to indicate improved ability to utilize multi-sensory input for balance. Target date: 06/04/15   PT LONG TERM GOAL #4   Title  Improve FGA score from 18/30 to > / = 23/30 to indicate improved dynamic gait stability, decreased fall risk. Target date: 06/04/15   PT LONG TERM GOAL #5   Title Pt will report no episodes of disequilibrium while bowling for 2 consecutive games to indicate return to PLOF. Target date: 06/04/15.               Plan - 05/09/15 1817    Clinical Impression Statement Pt is a 79 y/o F referred to outpatient PT to assess functional impairments associated with acute left-sided weakess and dizziness; MRI negative acute abnormality but did show for mild to moderate chronic small vessel ischemic disease. PT evaluation revealsthe following: gait impairments; FGA score suggestive of increased fall risk and decreased dynamic gait stability; impaired VOR cancellation; decreased LLE coordination; decreased L hip strength. Pt will benefit from skilled outpatient PT 2x/week for 4 weeks beginning 05/02/15 (start date per request of pt) to address said impairments.    Pt will benefit from skilled therapeutic intervention in order to improve on the following deficits Abnormal gait;Decreased balance;Decreased coordination;Decreased strength;Other (comment)  disequilibrium   Rehab Potential Good   Clinical Impairments Affecting Rehab Potential Delay in start of treatment (per pt request) until 05/02/15.   PT Frequency 2x / week   PT Duration 4 weeks   PT Treatment/Interventions ADLs/Self Care Home Management;Balance training;Therapeutic exercise;Therapeutic activities;Functional mobility training;Stair training;Gait training;Neuromuscular re-education;Canalith Repostioning;Patient/family education;Vestibular   PT Next Visit Plan Perform SOT. Initiate HEP based on findings.   Consulted and Agree with Plan of Care Patient          G-Codes - 05-09-2015 1817    Functional Assessment Tool Used FGA 18/30   Functional Limitation Mobility: Walking and moving around   Mobility: Walking and Moving Around Current Status  [phone]) At least 40 percent but less than 60 percent impaired, limited or restricted  Mobility: Walking and Moving Around Goal Status [phone]) At least 20 percent but less than 40 percent impaired, limited or restricted       Problem List Patient Active Problem List   Diagnosis Date Noted  . CVA (cerebral infarction) 02/03/2015  . Left sided numbness 02/03/2015  . Elevated blood pressure 02/03/2015  . Breast cancer of lower-outer quadrant of left female breast ([place]) 02/13/2014    Billie Ruddy, PT, DPT [facility] [address] [place], [place], [address] Phone: [phone]   Fax:  [phone] 04/09/2015, 6:31 PM   Name: Joy Cain MRN: [id number] Date of Birth: 15-Jan-1936

## 2015-04-25 ENCOUNTER — Ambulatory Visit: Payer: Medicare Other

## 2015-05-01 ENCOUNTER — Ambulatory Visit (INDEPENDENT_AMBULATORY_CARE_PROVIDER_SITE_OTHER): Payer: Medicare Other | Admitting: Neurology

## 2015-05-01 ENCOUNTER — Encounter: Payer: Self-pay | Admitting: Neurology

## 2015-05-01 VITALS — BP 140/70 | HR 51 | Wt 149.5 lb

## 2015-05-01 DIAGNOSIS — E538 Deficiency of other specified B group vitamins: Secondary | ICD-10-CM

## 2015-05-01 DIAGNOSIS — G609 Hereditary and idiopathic neuropathy, unspecified: Secondary | ICD-10-CM | POA: Diagnosis not present

## 2015-05-01 MED ORDER — CYANOCOBALAMIN 1000 MCG/ML IJ SOLN
1000.0000 ug | Freq: Once | INTRAMUSCULAR | Status: AC
  Administered 2015-05-01: 1000 ug via INTRAMUSCULAR

## 2015-05-01 NOTE — Progress Notes (Signed)
Follow-up Visit   Date: 05/01/2015   Joy Cain MRN: [id number] DOB: [birth year]   Interim History: Joy Cain is a 80 y.o. right-handed Caucasian African American female with left breast cancer s/p mastectomy (1986), asthma, GERD, arthritis returning to the clinic for follow-up of left sided weakness and dizziness.  The patient was accompanied to the clinic by self.    History of present illness: Starting around early 2016, she began experiencing dizzy spells, described as room-spinning, which was short-lived and intermittent at first, but in mid-October, the dizziness became more constant and lasting all day which prompted her to go to the emergency department. It was triggered by quick positional changes, such as bending. She has noticed anything that helps, including meclizine. She is scheduled to see ENT next week. She has not done vestibular therapy.   Around the same time last week, she developed left hand numbness and left anterior lower leg and dorsum of the foot. Symptoms last several hours but can return without identifiable triggers. She feels her left arm and leg is fatigued and tired. She is not dropping items or having falls.   She was admitted to [facility] on 10/16 - 02/05/2015 for dizziness and left sided paresthesias. Neurology was consulted and recommended TIA evaluation which returned negative. MRI/A brain, echo, and US carotids was normal. She was started on plavix 2m daily due to aspirin allergy for secondary risk prevention and encouraged to see ENT as an out-patient.   She was also recently diagnosed with sinusitis and given a prescription for antibiotics, but has not started it yet.  No history of diabetes, alcohol, or chemotherapy  UPDATE 05/01/2015:  She reports feeling much better since drinking more fluids, walking, and taking vitamin B12 injections. NCS/EMG showed length-dependent sensorimotor neuropathy with mild involvement of the  upper extremity also.  She does not have any more dizzy spells or numbness of the left hand. She still is "wobbly" at times, but denies any falls.  She started balance training but has only completed the initial evaluation.  She saw ENT and had normal evaluation.  No new complaints.  She is back to her usual activities and is happy that she has been able to get back into bowling.  Medications:  Current Outpatient Prescriptions on File Prior to Visit  Medication Sig Dispense Refill  . clindamycin (CLEOCIN) 300 MG capsule Take 1 capsule (300 mg total) by mouth 3 (three) times daily.    . clopidogrel (PLAVIX) 75 MG tablet Take 1 tablet (75 mg total) by mouth daily. 30 tablet 0  . glucosamine-chondroitin 500-400 MG tablet Take 1 tablet by mouth daily as needed (FL[place]a.    .Marland Kitchenmeclizine (ANTIVERT) 25 MG tablet Take 1 tablet (25 mg total) by mouth every 8 (eight) hours. 30 tablet 0   No current facility-administered medications on file prior to visit.    Allergies:  Allergies  Allergen Reactions  . Aspirin Other (See Comments)    GI TRACT UPSET    Review of Systems:  CONSTITUTIONAL: No fevers, chills, night sweats, or weight loss.  EYES: No visual changes or eye pain ENT: No hearing changes.  No history of nose bleeds.   RESPIRATORY: No cough, wheezing and shortness of breath.   CARDIOVASCULAR: Negative for chest pain, and palpitations.   GI: Negative for abdominal discomfort, blood in stools or black stools.  No recent change in bowel habits.   GU:  No history of incontinence.   MUSCLOSKELETAL: No  history of joint pain or swelling.  No myalgias.   SKIN: Negative for lesions, rash, and itching.   ENDOCRINE: Negative for cold or heat intolerance, polydipsia or goiter.   PSYCH:  No depression or anxiety symptoms.   NEURO: As Above.   Vital Signs:  BP 140/70 mmHg  Pulse 51  Wt 149 lb 8 oz (67.813 kg)  SpO2 96%  Neurological Exam: MENTAL STATUS including orientation to time,  place, person, recent and remote memory, attention span and concentration, language, and fund of knowledge is normal.  Speech is not dysarthric.  CRANIAL NERVES: Pupils equal round and reactive to light.  Normal conjugate, extra-ocular eye movements in all directions of gaze.  No ptosis. Normal facial sensation.  Face is symmetric. Palate elevates symmetrically.  Tongue is midline.  MOTOR:  Motor strength is 5/5 in all extremities.  No atrophy, fasciculations or abnormal movements.  No pronator drift.  Tone is normal.    MSRs:  Right                                                                 Left brachioradialis 2+  brachioradialis 2+  biceps 2+  biceps 2+  triceps 1+  triceps 1+  patellar 0  Patellar 0  ankle jerk 0  ankle jerk 0   SENSORY:  Vibration is diminished distal to ankles bilaterally.  There is sway with Rhomberg testing .  COORDINATION/GAIT: Gait narrow based and stable. She is unsteady with tandem gait.   Data: NCS/EMG 02/26/2015:  The electrophysiologic findings are most consistent with a length dependent sensorimotor polyneuropathy, axon loss in type, affecting the left side.  MRI/A brain 02/04/2015: MRI HEAD IMPRESSION: 1. No acute intracranial infarct or other process identified. 2. Mild to moderate chronic small vessel ischemic disease, similar to prior. 3. Mild mucosal thickening within the paranasal sinuses with air-fluid levels within the right sphenoid sinus, suggesting acute sinus disease.  MRA HEAD IMPRESSION: Normal MRA of the intracranial circulation.  Echo 02/04/2015: normal   US carotids 02/04/2015: Bilateral: intimal wall thickening CCA. 1-39% ICA plaquing. Vertebral artery flow is antegrade.  Labs 02/04/2015: HbA1c 5.7, ESR 26, TSH 1.68 Labs 02/13/2015:  Vitamin B12 245*, vitamin B6 9.3, copper 95, ceruloplasmin 26, SPEP/UPEP with IFE no M protein  IMPRESSION: 1.  Orthostasis.  She reports resolution of dizziness since increasing water  intake and starting vitamin B12 injections 2.  Peripheral neuropathy causing sensory ataxia and intermittent paresthesias of the hands. NCS/EMG demonstrated length-dependent pattern of neuropathy involving both upper and lower extremity.  Labs are unremarkable except for low-normal B12 injections, but she is doing well since starting supplementation so this will be continued.   3.  Vitamin B12 deficiency.    PLAN: 1. Stay well hydrated 2. She will continue vitamin B12 injection monthly (administered today).   3. Continue balance therapy 4. Fall precuations discussed  Return to clinic in 6 months   The duration of this appointment visit was 25 minutes of face-to-face time with the patient.  Greater than 50% of this time was spent in counseling, explanation of diagnosis, planning of further management, and coordination of care.   Thank you for allowing me to participate in patient's care.  If I can answer any additional questions, I would  be pleased to do so.    Sincerely,    Izaha Shughart K. Posey Pronto, DO

## 2015-05-01 NOTE — Patient Instructions (Signed)
1.  Continue vitamin B12 injections monthly. 2.  Continue balance therapy  Return to clinic in August

## 2015-05-02 ENCOUNTER — Ambulatory Visit: Payer: Medicare Other | Attending: Family Medicine | Admitting: Rehabilitation

## 2015-05-02 ENCOUNTER — Encounter: Payer: Self-pay | Admitting: Rehabilitation

## 2015-05-02 DIAGNOSIS — Z7409 Other reduced mobility: Secondary | ICD-10-CM | POA: Insufficient documentation

## 2015-05-02 DIAGNOSIS — R2681 Unsteadiness on feet: Secondary | ICD-10-CM | POA: Insufficient documentation

## 2015-05-02 DIAGNOSIS — R278 Other lack of coordination: Secondary | ICD-10-CM | POA: Diagnosis not present

## 2015-05-02 DIAGNOSIS — R269 Unspecified abnormalities of gait and mobility: Secondary | ICD-10-CM | POA: Insufficient documentation

## 2015-05-02 DIAGNOSIS — R42 Dizziness and giddiness: Secondary | ICD-10-CM | POA: Insufficient documentation

## 2015-05-02 NOTE — Therapy (Signed)
[facility] [address] [place], [place], [address] Phone: [phone]   Fax:  [phone]  Physical Therapy Treatment  Patient Details  Name: Joy Cain MRN: [id number] Date of Birth: [birth year] Referring Provider: Antony Blackbird, MD (Referred by hospitalist; pt requesting notes to PCP)  Encounter Date: 05/02/2015      PT End of Session - 05/02/15 1017    Visit Number 2   Number of Visits 9  eval + 8 visits   Date for PT Re-Evaluation 06/08/15   Authorization Type MCR trad primary - GCodes required   PT Start Time 1015   PT Stop Time 1100   PT Time Calculation (min) 45 min   Activity Tolerance Patient tolerated treatment well   Behavior During Therapy [facility] for tasks assessed/performed      Past Medical History  Diagnosis Date  . Breast cancer ([place])   . Asthma   . Osteoporosis   . GERD (gastroesophageal reflux disease)   . Dysphagia     Past Surgical History  Procedure Laterality Date  . Bunionectomy    . Hammer toe surgery    . Mastectomy      There were no vitals filed for this visit.  Visit Diagnosis:  Abnormality of gait  Unsteadiness  Disequilibrium      Subjective Assessment - 05/02/15 1016    Subjective "I've been out of town in [place] for the holidays.  I've been doing about the same, still losing my balance sometimes to the L."    Pertinent History Goes by "[name]". PMH: breast cancer s/p L mastectomy (currently in remission), hereditary and idiopathic peripheral neuropathy, asthma, osteoporosis, GERD, mild chronic small vessel ischemic change    Patient Stated Goals "I want to find out if I need physical therapy; and if so, maybe you can help me with my balance."   Currently in Pain? No/denies              NMR:  Performed SOT during session to assess balance system deficits.  Note that pt with most difficulty when somatosensory and visual input decreased, therefore provided  HEP based on this. See pt instruction for full details.  Note composite score above age matched normal and has good use of ankle and hip strategies, however feel that she can improve vestibular system and ability to increase forward and L lateral weight shift as seen on COG chart of SOT.  Pt educated on meaning of results and verbalized understanding.                    PT Education - 05/02/15 1016    Education provided Yes   Education Details HEP, see pt instruction   Person(s) Educated Patient   Methods Explanation   Comprehension Verbalized understanding          PT Short Term Goals - 04/09/15 1825    PT SHORT TERM GOAL #1   Title STG's = LTG's           PT Long Term Goals - 04/09/15 1825    PT LONG TERM GOAL #1   Title Pt will perform initial HEP with mod I using paper handout to maximize functional gains made in PT. Target date: 06/04/15   PT LONG TERM GOAL #2   Title Perform SOT to assess pt ability to utilize multi-sensory input for balance. Target date: 06/04/15   PT LONG TERM GOAL #3   Title Improve SOT composite score by  8 points from baseline to indicate improved ability to utilize multi-sensory input for balance. Target date: 06/04/15   PT LONG TERM GOAL #4   Title Improve FGA score from 18/30 to > / = 23/30 to indicate improved dynamic gait stability, decreased fall risk. Target date: 06/04/15   PT LONG TERM GOAL #5   Title Pt will report no episodes of disequilibrium while bowling for 2 consecutive games to indicate return to PLOF. Target date: 06/04/15.               Plan - 05/02/15 1017    Clinical Impression Statement Skilled session focused on assessment of balance systems with SOT on balance master.   Note that pt within functional limits based on age matched norms, however did note that she has most difficulty when vestibular system isolated, therefore provided pt with initial corner HEP for balance and SLS based on deficits.     Pt will  benefit from skilled therapeutic intervention in order to improve on the following deficits Abnormal gait;Decreased balance;Decreased coordination;Decreased strength;Other (comment)  disequilibrium   Rehab Potential Good   Clinical Impairments Affecting Rehab Potential --   PT Frequency 2x / week   PT Duration 4 weeks   PT Treatment/Interventions ADLs/Self Care Home Management;Balance training;Therapeutic exercise;Therapeutic activities;Functional mobility training;Stair training;Gait training;Neuromuscular re-education;Canalith Repostioning;Patient/family education;Vestibular   PT Next Visit Plan assess compliance/difficulty with HEP, increase challenge as needed, high level balance w/ vestibular challenge.    Consulted and Agree with Plan of Care Patient        Problem List Patient Active Problem List   Diagnosis Date Noted  . Hereditary and idiopathic peripheral neuropathy 05/01/2015  . CVA (cerebral infarction) 02/03/2015  . Left sided numbness 02/03/2015  . Elevated blood pressure 02/03/2015  . Breast cancer of lower-outer quadrant of left female breast ([place]) 02/13/2014    Cameron Sprang, PT, MPT [facility] [address] [place], [place], [address] Phone: [phone]   Fax:  [phone] 05/02/2015, 12:18 PM  Name: Joy Cain MRN: [id number] Date of Birth: 1935-09-10

## 2015-05-02 NOTE — Patient Instructions (Signed)
SINGLE LIMB STANCE    Stand in corner with chair in front of you.  Stance: single leg on floor. Raise leg. Hold _30__ seconds. Repeat with other leg. _3__ reps per set, _1-2__ sets per day, _5__ days per week  Copyright  VHI. All rights reserved.   Feet Together, Head Motion - Eyes Closed    Stand in corner with chair in front of you.  With eyes closed and feet together, move head slowly, up and down.  Repeat up and down x 15 reps and then do side to side x 15 reps.   Do _1-2_ sessions per day.  Copyright  VHI. All rights reserved.   Feet Together (Compliant Surface) Arm Motion - Eyes Open    Stand in corner with chair in front of you.  With eyes open, standing on compliant surface:___stacked pillows_____, feet together, keep arms by your side.  Hold for 30 secs, repeat 3 times. Do __1-2__ sessions per day.  Copyright  VHI. All rights reserved.   Feet Together (Compliant Surface) Head Motion - Eyes Open    Stand in corner with chair in front of you.  With eyes open, standing on compliant surface: __stacked pillows______, feet together, move head slowly: up and down x 15 reps.  Then repeat side to side x 15 reps.   Repeat __1__ times per session. Do __1-2__ sessions per day.  Copyright  VHI. All rights reserved.

## 2015-05-07 ENCOUNTER — Ambulatory Visit: Payer: Medicare Other | Admitting: Rehabilitation

## 2015-05-07 ENCOUNTER — Encounter: Payer: Self-pay | Admitting: Rehabilitation

## 2015-05-07 DIAGNOSIS — R278 Other lack of coordination: Secondary | ICD-10-CM | POA: Diagnosis not present

## 2015-05-07 DIAGNOSIS — R269 Unspecified abnormalities of gait and mobility: Secondary | ICD-10-CM

## 2015-05-07 DIAGNOSIS — R2681 Unsteadiness on feet: Secondary | ICD-10-CM | POA: Diagnosis not present

## 2015-05-07 DIAGNOSIS — Z7409 Other reduced mobility: Secondary | ICD-10-CM | POA: Diagnosis not present

## 2015-05-07 DIAGNOSIS — R42 Dizziness and giddiness: Secondary | ICD-10-CM

## 2015-05-07 NOTE — Patient Instructions (Signed)
SINGLE LIMB STANCE    Stand in corner with chair in front of you. Stance: single leg on floor. Raise leg. Hold _30__ seconds. Repeat with other leg. _3__ reps per set, _1-2__ sets per day, _5__ days per week  Copyright  VHI. All rights reserved.   Feet Together, Head Motion - Eyes Closed    Stand in corner with chair in front of you. With eyes closed and feet together, move head slowly, up and down. Repeat up and down x 15 reps and then do side to side x 15 reps. As this gets easier, speed up your head turns!  Do _1-2_ sessions per day.  Copyright  VHI. All rights reserved.   Feet Together (Compliant Surface) Varied Arm Positions - Eyes Closed    Stand on compliant surface: ___stacked pillows_____ with feet together and arms out. Close eyes and visualize upright position. Hold__20__ seconds. Repeat _3___ times per session. Do __2__ sessions per day.  Copyright  VHI. All rights reserved.    Feet Together (Compliant Surface) Head Motion - Eyes Open    Stand in corner with chair in front of you. With eyes open, standing on compliant surface: __stacked pillows______, feet together, move head slowly: up and down x 15 reps. Then repeat side to side x 15 reps.  Repeat __1__ times per session. Do __1-2__ sessions per day.   Feet Apart (Compliant Surface) Head Motion - Eyes Closed    Stand on compliant surface: ___stacked pillows_____ with feet shoulder width apart. Close eyes and move head slowly, up and down x 15 reps.  Repeat moving head side to side x 15 reps.   Repeat _1___ times per session. Do __2__ sessions per day.  Copyright  VHI. All rights reserved.

## 2015-05-07 NOTE — Therapy (Signed)
[facility] [address] [place], [place], [address] Phone: [phone]   Fax:  [phone]  Physical Therapy Treatment  Patient Details  Name: Joy Cain MRN: [id number] Date of Birth: 1935/08/01 Referring Provider: Antony Blackbird, MD (Referred by hospitalist; pt requesting notes to PCP)  Encounter Date: 05/07/2015      PT End of Session - 05/07/15 1025    Visit Number 3   Number of Visits 9  eval + 8 visits   Date for PT Re-Evaluation 06/08/15   Authorization Type MCR trad primary - GCodes required   PT Start Time 1017   PT Stop Time 1100   PT Time Calculation (min) 43 min   Activity Tolerance Patient tolerated treatment well   Behavior During Therapy [facility] for tasks assessed/performed      Past Medical History  Diagnosis Date  . Breast cancer ([place])   . Asthma   . Osteoporosis   . GERD (gastroesophageal reflux disease)   . Dysphagia     Past Surgical History  Procedure Laterality Date  . Bunionectomy    . Hammer toe surgery    . Mastectomy      There were no vitals filed for this visit.  Visit Diagnosis:  Abnormality of gait  Unsteadiness  Disequilibrium      Subjective Assessment - 05/07/15 1024    Subjective "I've been out of town and so I haven't done my exercises but one time."    Pertinent History Goes by "[name]". PMH: breast cancer s/p L mastectomy (currently in remission), hereditary and idiopathic peripheral neuropathy, asthma, osteoporosis, GERD, mild chronic small vessel ischemic change    Patient Stated Goals "I want to find out if I need physical therapy; and if so, maybe you can help me with my balance."   Currently in Pain? No/denies           NMR:  Went over current HEP and modified as needed, see pt instruction for full details.  Performed all as stated on HEP.  Pt tolerated well with no increase in dizziness.  Progressed to continue working on hip strategy and balance  with wall bumps while standing on foam balance beam.  Performed x 10 reps with 5 second hold while off of wall with cues for increased hip extension.  Ended session with performing several tasks while standing on mat on ramp (facing towards decline) as she tends to keep her weight forward.  Performed feet staggered x 30 secs each (min/guard needed intermittently for balance), feet together x 30 secs, feet together EC x 30 secs (min A for single LOB), and marching x 10 reps BLE for modified SLS task.  Requires min A during marching due to marked LOB on several occasions.                          PT Education - 05/07/15 1025    Education provided Yes   Education Details modified and updated HEP   Person(s) Educated Patient   Methods Explanation;Handout   Comprehension Verbalized understanding          PT Short Term Goals - 04/09/15 1825    PT SHORT TERM GOAL #1   Title STG's = LTG's           PT Long Term Goals - 05/07/15 1026    PT LONG TERM GOAL #1   Title Pt will perform initial HEP with mod I using paper  handout to maximize functional gains made in PT. Target date: 06/04/15   PT LONG TERM GOAL #2   Title Perform SOT to assess pt ability to utilize multi-sensory input for balance. Target date: 06/04/15   PT LONG TERM GOAL #3   Title Improve SOT composite score by 8 points from baseline to indicate improved ability to utilize multi-sensory input for balance. Target date: 06/04/15   PT LONG TERM GOAL #4   Title Improve FGA score from 18/30 to > / = 23/30 to indicate improved dynamic gait stability, decreased fall risk. Target date: 06/04/15   PT LONG TERM GOAL #5   Title Pt will report no episodes of disequilibrium while bowling for 2 consecutive games to indicate return to PLOF. Target date: 06/04/15.   Baseline met 05/07/15 (pt reports she bowled all weekend and had no dizziness).    Status Achieved               Plan - 05/07/15 1025    Clinical Impression  Statement Skilled session focused on assessment of compliance and performance with HEP.  Performed current HEP and note changes needed to be made for increased challenge, therefore modified, see pt instruction for details.  Also note that she has met one LTG for reporting no dysequilibrium during 2 games of bowling.     Pt will benefit from skilled therapeutic intervention in order to improve on the following deficits Abnormal gait;Decreased balance;Decreased coordination;Decreased strength;Other (comment)  disequilibrium   Rehab Potential Good   PT Frequency 2x / week   PT Duration 4 weeks   PT Treatment/Interventions ADLs/Self Care Home Management;Balance training;Therapeutic exercise;Therapeutic activities;Functional mobility training;Stair training;Gait training;Neuromuscular re-education;Canalith Repostioning;Patient/family education;Vestibular   PT Next Visit Plan assess compliance/difficulty with HEP, increase challenge as needed, high level balance w/ vestibular challenge.    Consulted and Agree with Plan of Care Patient        Problem List Patient Active Problem List   Diagnosis Date Noted  . Hereditary and idiopathic peripheral neuropathy 05/01/2015  . CVA (cerebral infarction) 02/03/2015  . Left sided numbness 02/03/2015  . Elevated blood pressure 02/03/2015  . Breast cancer of lower-outer quadrant of left female breast ([place]) 02/13/2014    Cameron Sprang, PT, MPT [facility] [address] [place], [place], [address] Phone: [phone]   Fax:  [phone] 05/07/2015, 12:47 PM  Name: Joy Cain MRN: [id number] Date of Birth: [birth year]

## 2015-05-09 ENCOUNTER — Ambulatory Visit: Payer: Medicare Other | Admitting: Physical Therapy

## 2015-05-09 DIAGNOSIS — R2681 Unsteadiness on feet: Secondary | ICD-10-CM | POA: Diagnosis not present

## 2015-05-09 DIAGNOSIS — R278 Other lack of coordination: Secondary | ICD-10-CM | POA: Diagnosis not present

## 2015-05-09 DIAGNOSIS — R42 Dizziness and giddiness: Secondary | ICD-10-CM | POA: Diagnosis not present

## 2015-05-09 DIAGNOSIS — Z7409 Other reduced mobility: Secondary | ICD-10-CM | POA: Diagnosis not present

## 2015-05-09 DIAGNOSIS — R269 Unspecified abnormalities of gait and mobility: Secondary | ICD-10-CM | POA: Diagnosis not present

## 2015-05-09 NOTE — Patient Instructions (Addendum)
SINGLE LIMB STANCE    Stand in corner with chair in front of you. Stance: single leg on floor. Raise leg. Hold _30__ seconds. Repeat with other leg. _3__ reps per set, _1-2__ sets per day, _5__ days per week   Feet Together (Compliant Surface) Varied Arm Positions - Eyes Closed    Stand on compliant surface: ___stacked pillows_____ with feet together and arms out. Close eyes and visualize upright position. Hold__30__ seconds. Repeat _3___ times per session. Do __2__ sessions per day.  Copyright  VHI. All rights reserved.    Feet Together (Compliant Surface) Head Motion - Eyes Open    Stand in corner with chair in front of you. With eyes open, standing on compliant surface: __stacked pillows______, feet together, move head slowly: up and down 15 reps. Then repeat side to side 15 reps.  diagonally up/right to down left 15 times; and diagonally up/left to down/right 15 times. Repeat __1__ times per session. Do __1-2__ sessions per day.   Feet Together (Compliant Surface) Head Motion - Eyes Closed    Stand on stacked pillows with feet together. Close eyes and move head slowly, up and down 15 times; side to side 15 times. Repeat __1__ times per session. Do __1-2__ sessions per day.  Copyright  VHI. All rights reserved.             Gaze Stabilization: Tip Card 1.Target must remain in focus, not blurry, and appear stationary while head is in motion. 2.Perform exercises with small head movements (45 to either side of midline). 3.Increase speed of head motion so long as target is in focus. 4.If you wear eyeglasses, be sure you can see target through lens (therapist will give specific instructions for bifocal / progressive lenses). 5.These exercises may provoke dizziness or nausea. Work through these symptoms. If too dizzy, slow head movement slightly. Rest between each exercise. 6.Exercises demand concentration; avoid distractions. 7.For safety, perform standing  exercises close to a counter, wall, corner, or next to someone.  Copyright  VHI. All rights reserved.  Gaze Stabilization: Standing Feet Apart   Feet shoulder width apart, keeping eyes on target on wall 5-6 feet away, tilt head down slightly and move head side to side for 30 seconds. Repeat while moving head up and down for 30 seconds. Do 2 sessions per day.   Copyright  VHI. All rights reserved.

## 2015-05-09 NOTE — Therapy (Signed)
[facility] [address] [place], [place], [address] Phone: [phone]   Fax:  [phone]  Physical Therapy Treatment  Patient Details  Name: Joy Cain MRN: [id number] Date of Birth: 16-Nov-1935 Referring Provider: Antony Blackbird, MD (Referred by hospitalist; pt requesting notes to PCP)  Encounter Date: 05/09/2015      PT End of Session - 05/09/15 1435    Visit Number 4   Number of Visits 9  eval + 8 visits   Date for PT Re-Evaluation 06/08/15   Authorization Type MCR trad primary - GCodes required   PT Start Time 1319   PT Stop Time 1400   PT Time Calculation (min) 41 min   Activity Tolerance Patient tolerated treatment well   Behavior During Therapy [facility] for tasks assessed/performed      Past Medical History  Diagnosis Date  . Breast cancer ([place])   . Asthma   . Osteoporosis   . GERD (gastroesophageal reflux disease)   . Dysphagia     Past Surgical History  Procedure Laterality Date  . Bunionectomy    . Hammer toe surgery    . Mastectomy      There were no vitals filed for this visit.  Visit Diagnosis:  Abnormality of gait  Unsteadiness  Coordination abnormal      Subjective Assessment - 05/09/15 1321    Subjective "I need help with these exercises. That one where I'm standing on one leg is hard."   Pertinent History Goes by "[name]". PMH: breast cancer s/p L mastectomy (currently in remission), hereditary and idiopathic peripheral neuropathy, asthma, osteoporosis, GERD, mild chronic small vessel ischemic change    Patient Stated Goals "I want to find out if I need physical therapy; and if so, maybe you can help me with my balance."   Currently in Pain? No/denies                Vestibular Assessment - 05/09/15 0001    Vestibulo-Occular Reflex   VOR 1 Head Only (x 1 viewing) Pt with slow head movement (both horizontal and vertical) and report of visual blurring with horizontal VOR  which seemed to diminish with decreased speed of head movement.Hulan Fess Adult PT Treatment/Exercise - 05/09/15 0001    Transfers   Transfers Sit to Stand;Stand to Sit   Sit to Stand 7: Independent   Stand to Sit 7: Independent   Ambulation/Gait   Ambulation/Gait Yes   Ambulation/Gait Assistance 5: Supervision;6: Modified independent (Device/Increase time)   Ambulation/Gait Assistance Details Cueing for wider BOS   Ambulation Distance (Feet) 375 Feet   Assistive device None   Gait Pattern Step-through pattern;Narrow base of support;Left foot flat   Ambulation Surface Level;Indoor;Unlevel;Outdoor;Paved         Vestibular Treatment/Exercise - 05/09/15 0001    Vestibular Treatment/Exercise   Vestibular Treatment Provided Gaze   Gaze Exercises X1 Viewing Horizontal;X1 Viewing Vertical   X1 Viewing Horizontal   Foot Position Standing; shoulder width   Comments x30 seconds with cueing for technique; to decrease speed of head movement when vision blurred.   X1 Viewing Vertical   Foot Position standing; shoulder width   Comments x30 seconds            Balance Exercises - 05/09/15 1410    Balance Exercises: Standing   Standing Eyes Opened Narrow base of support (BOS);Head turns;Foam/compliant surface;Other reps (comment)  2 pillows;vertical, horizontal, diagonal head turns x15 each   Standing Eyes Closed Narrow base of support (BOS);Head turns;Foam/compliant surface;Other reps (comment)  vertical, horizontal head turns   Gait with Head Turns Forward;Other (comment)  x20' with min A to recover from LOB with horiz. head turns   Tandem Gait Forward;Other reps (comment)  x30' with min guard   Retro Gait 2 reps;Other (comment)  44' x2 trials with (S); cueing to address narrow BOS   Other Standing Exercises LE braiding x35' per direction with verbal/visual cueing for technique.           PT Education - 05/09/15 1403    Education provided Yes    Education Details Updated HEP to progress and add gaze stabilization; see PT Instructions for details.   Person(s) Educated Patient   Methods Explanation;Demonstration;Verbal cues;Handout   Comprehension Verbalized understanding;Returned demonstration          PT Short Term Goals - 04/09/15 1825    PT SHORT TERM GOAL #1   Title STG's = LTG's           PT Long Term Goals - 05/07/15 1026    PT LONG TERM GOAL #1   Title Pt will perform initial HEP with mod I using paper handout to maximize functional gains made in PT. Target date: 06/04/15   PT LONG TERM GOAL #2   Title Perform SOT to assess pt ability to utilize multi-sensory input for balance. Target date: 06/04/15   PT LONG TERM GOAL #3   Title Improve SOT composite score by 8 points from baseline to indicate improved ability to utilize multi-sensory input for balance. Target date: 06/04/15   PT LONG TERM GOAL #4   Title Improve FGA score from 18/30 to > / = 23/30 to indicate improved dynamic gait stability, decreased fall risk. Target date: 06/04/15   PT LONG TERM GOAL #5   Title Pt will report no episodes of disequilibrium while bowling for 2 consecutive games to indicate return to PLOF. Target date: 06/04/15.   Baseline met 05/07/15 (pt reports she bowled all weekend and had no dizziness).    Status Achieved               Plan - 05/09/15 1605    Clinical Impression Statement Session focused on high level balance, dynamic gait activities, and progressing HEP. Noted increased gait instability with horizontal head turns; impaired VOR. Progressed current HEP and added gaze stabilization to current HEP.   Pt will benefit from skilled therapeutic intervention in order to improve on the following deficits Abnormal gait;Decreased balance;Decreased coordination;Decreased strength;Other (comment)  disequilibrium   Rehab Potential Good   Clinical Impairments Affecting Rehab Potential Delay in start of treatment (per pt request) until  05/02/15.   PT Frequency 2x / week   PT Duration 4 weeks   PT Treatment/Interventions ADLs/Self Care Home Management;Balance training;Therapeutic exercise;Therapeutic activities;Functional mobility training;Stair training;Gait training;Neuromuscular re-education;Canalith Repostioning;Patient/family education;Vestibular   PT Next Visit Plan Continue to progress HEP; high level balance w/ vestibular challenge.    Consulted and Agree with Plan of Care Patient        Problem List Patient Active Problem List   Diagnosis Date Noted  . Hereditary and idiopathic peripheral neuropathy 05/01/2015  . CVA (cerebral infarction) 02/03/2015  . Left sided numbness 02/03/2015  . Elevated blood pressure 02/03/2015  . Breast cancer of lower-outer quadrant of left female breast ([place]) 02/13/2014    Billie Ruddy, PT, [facility] [address]  [place], [place], [address] Phone: [phone]   Fax:  [phone] 05/09/2015, 4:13 PM  Name: Joy Cain MRN: [id number] Date of Birth: Apr 14, 1936

## 2015-05-13 ENCOUNTER — Ambulatory Visit: Payer: Medicare Other | Admitting: Physical Therapy

## 2015-05-13 DIAGNOSIS — R42 Dizziness and giddiness: Secondary | ICD-10-CM

## 2015-05-13 DIAGNOSIS — Z7409 Other reduced mobility: Secondary | ICD-10-CM | POA: Diagnosis not present

## 2015-05-13 DIAGNOSIS — R2681 Unsteadiness on feet: Secondary | ICD-10-CM | POA: Diagnosis not present

## 2015-05-13 DIAGNOSIS — R278 Other lack of coordination: Secondary | ICD-10-CM | POA: Diagnosis not present

## 2015-05-13 DIAGNOSIS — R269 Unspecified abnormalities of gait and mobility: Secondary | ICD-10-CM | POA: Diagnosis not present

## 2015-05-13 NOTE — Therapy (Signed)
[facility] [address] [place], [place], [address] Phone: [phone]   Fax:  [phone]  Physical Therapy Treatment  Patient Details  Name: Joy Cain MRN: [id number] Date of Birth: 1935-11-14 Referring Provider: Antony Blackbird, MD (Referred by hospitalist; pt requesting notes to PCP)  Encounter Date: 05/13/2015      PT End of Session - 05/13/15 1229    Visit Number 5   Number of Visits 9  eval + 8 visits   Date for PT Re-Evaluation 06/08/15   Authorization Type MCR trad primary - GCodes required   PT Start Time 1016   PT Stop Time 1102   PT Time Calculation (min) 46 min   Equipment Utilized During Treatment Other (comment)  Balance Master and harness   Activity Tolerance Patient tolerated treatment well   Behavior During Therapy [facility] for tasks assessed/performed      Past Medical History  Diagnosis Date  . Breast cancer ([place])   . Asthma   . Osteoporosis   . GERD (gastroesophageal reflux disease)   . Dysphagia     Past Surgical History  Procedure Laterality Date  . Bunionectomy    . Hammer toe surgery    . Mastectomy      There were no vitals filed for this visit.  Visit Diagnosis:  Abnormality of gait  Unsteadiness  Disequilibrium      Subjective Assessment - 05/13/15 1019    Subjective Pt has been attempting to contact hospitalist (Dr.Vega) to find out if she should still be taking Plavix. Pt hasn't heard from MD regarding this. Pt feels as though she is functionally doing "a lot better," per pt.   Pertinent History Goes by "[name]". PMH: breast cancer s/p L mastectomy (currently in remission), hereditary and idiopathic peripheral neuropathy, asthma, osteoporosis, GERD, mild chronic small vessel ischemic change    Patient Stated Goals "I want to find out if I need physical therapy; and if so, maybe you can help me with my balance."   Currently in Pain? No/denies                 Vestibular Assessment - 05/13/15 0001    Balancemaster   Therapist, occupational Comment SOT findings: Composite score 63%, which is below age/height-matched normative value of 65%. Pt demonstrates abnormal ability to utilize vestibular input for balance (vestibular 27% as compared with age/height-matched norm of 50%). Pt sustained 1 "fall" during condition 5.                  Vestibular Treatment/Exercise - 05/13/15 0001    Vestibular Treatment/Exercise   Vestibular Treatment Provided Gaze   Gaze Exercises X1 Viewing Horizontal;X1 Viewing Vertical   X1 Viewing Horizontal   Foot Position Standing; shoulder width   Comments x30 seconds with cueing to increase speed of head movement as long as target is in focus. Noted pt difficulty coordinating head movement without moving body concurrently.   X1 Viewing Vertical   Foot Position standing; shoulder width   Comments x30 seconds            Balance Exercises - 05/13/15 1231    Balance Exercises: Standing   Standing Eyes Opened Narrow base of support (BOS);Head turns;Foam/compliant surface;Other reps (comment)  2 pillows;vertical, horizontal, diagonal head turns x15 each   Standing Eyes Closed Narrow base of support (BOS);Head turns;Foam/compliant surface;Other reps (comment)  vertical, horizontal head turns  PT Education - 05/13/15 1020    Education provided Yes   Education Details Per pt questions, recommending pt ask PCP about Plavix. SOT findings, functional implications/   Person(s) Educated Patient   Methods Explanation   Comprehension Verbalized understanding          PT Short Term Goals - 04/09/15 1825    PT SHORT TERM GOAL #1   Title STG's = LTG's           PT Long Term Goals - 05/13/15 1037    PT LONG TERM GOAL #1   Title Pt will perform initial HEP with mod I using paper handout to maximize functional gains made in PT. Target date: 06/04/15   Baseline  Met 1/23.   Status Achieved   PT LONG TERM GOAL #2   Title Perform SOT to assess pt ability to utilize multi-sensory input for balance. Target date: 06/04/15   Baseline Composite = 71% on 1/12 and 63% on 1/23.   Status Achieved   PT LONG TERM GOAL #3   Title Improve SOT composite score by 8 points from baseline to indicate improved ability to utilize multi-sensory input for balance. Target date: 06/04/15   Status On-going   PT LONG TERM GOAL #4   Title Improve FGA score from 18/30 to > / = 23/30 to indicate improved dynamic gait stability, decreased fall risk. Target date: 06/04/15   Status On-going   PT LONG TERM GOAL #5   Title Pt will report no episodes of disequilibrium while bowling for 2 consecutive games to indicate return to PLOF. Target date: 06/04/15.   Baseline met 05/07/15 (pt reports she bowled all weekend and had no dizziness).    Status Achieved               Plan - 05/13/15 1233    Clinical Impression Statement Began checking LTG's during this session, as pt perceives she is currently close to PLOF. Upon reassessing SOT, noted composite score decreased from 71% to 63%, which is below normative value of 65% for age/height. Pt reports not feeling different today. Will reassess SOT score prior to DC.   Pt will benefit from skilled therapeutic intervention in order to improve on the following deficits Abnormal gait;Decreased balance;Decreased coordination;Decreased strength;Other (comment)  disequilibrium   Rehab Potential Good   Clinical Impairments Affecting Rehab Potential Delay in start of treatment (per pt request) until 05/02/15.   PT Frequency 2x / week   PT Duration 4 weeks   PT Treatment/Interventions ADLs/Self Care Home Management;Balance training;Therapeutic exercise;Therapeutic activities;Functional mobility training;Stair training;Gait training;Neuromuscular re-education;Canalith Repostioning;Patient/family education;Vestibular   PT Next Visit Plan Progress HEP;  focus on decreasing visual/somatosensory input to increase reliance on vestib. input. Address hip strategy on compliant surfaces with EC.   Consulted and Agree with Plan of Care Patient        Problem List Patient Active Problem List   Diagnosis Date Noted  . Hereditary and idiopathic peripheral neuropathy 05/01/2015  . CVA (cerebral infarction) 02/03/2015  . Left sided numbness 02/03/2015  . Elevated blood pressure 02/03/2015  . Breast cancer of lower-outer quadrant of left female breast ([place]) 02/13/2014    Billie Ruddy, PT, DPT [facility] [address] [place], [place], [address] Phone: [phone]   Fax:  [phone] 05/13/2015, 12:42 PM   Name: Joy Cain MRN: [id number] Date of Birth: 04-14-1936

## 2015-05-16 ENCOUNTER — Ambulatory Visit: Payer: Medicare Other | Admitting: Physical Therapy

## 2015-05-16 DIAGNOSIS — Z7409 Other reduced mobility: Secondary | ICD-10-CM | POA: Diagnosis not present

## 2015-05-16 DIAGNOSIS — R278 Other lack of coordination: Secondary | ICD-10-CM | POA: Diagnosis not present

## 2015-05-16 DIAGNOSIS — R269 Unspecified abnormalities of gait and mobility: Secondary | ICD-10-CM

## 2015-05-16 DIAGNOSIS — R2681 Unsteadiness on feet: Secondary | ICD-10-CM | POA: Diagnosis not present

## 2015-05-16 DIAGNOSIS — R42 Dizziness and giddiness: Secondary | ICD-10-CM | POA: Diagnosis not present

## 2015-05-16 NOTE — Patient Instructions (Addendum)
SINGLE LIMB STANCE    Stand in corner with chair in front of you. Stance: single leg on floor. Raise leg. Hold _30__ seconds. Repeat with other leg. _3__ reps per set, _1-2__ sets per day, _5__ days per week   Feet Together (Compliant Surface) Head Motion - Eyes Open    Stand in corner with chair in front of you. With eyes open, standing on compliant surface: __stacked pillows______, feet together, move head slowly: up and down 15 reps. Then repeat side to side 15 reps. diagonally up/right to down left 15 times; and diagonally up/left to down/right 15 times. Repeat __1__ times per session. Do __1-2__ sessions per day.   Feet Apart (Compliant Surface) Head Motion - Eyes Closed     Stand on stacked pillows with feet apart. Close eyes and move head slowly, up and down 15 times; side to side 15 times. Repeat __1__ times per session. Do __1-2__ sessions per day.   Weight Shift: Anterior / Posterior (Righting / Equilibrium)    BEGIN WITH BACK LEANING AGAINST THE WALL AND HEELS 4 INCHES AWAY. Arch your back to use your hips to lift your body off the wall. Once in standing off the wall, hold for 5 seconds. Return slowly to the wall (don't break the glass!) letting your hips bump the wall and return to stand as described above.   Hold each position __5__ seconds. Repeat _15__ times per session. Do __1-2_ sessions per day.         Gaze Stabilization: Tip Card 1.Target must remain in focus, not blurry, and appear stationary while head is in motion. 2.Perform exercises with small head movements (45 to either side of midline). 3.Increase speed of head motion so long as target is in focus. 4.If you wear eyeglasses, be sure you can see target through lens (therapist will give specific instructions for bifocal / progressive lenses). 5.These exercises may provoke dizziness or nausea. Work through these symptoms. If too dizzy, slow head movement slightly. Rest between each  exercise. 6.Exercises demand concentration; avoid distractions. 7.For safety, perform standing exercises close to a counter, wall, corner, or next to someone.  Copyright  VHI. All rights reserved.  Gaze Stabilization: Standing Feet Apart   Feet shoulder width apart, keeping eyes on target on wall 5-6 feet away, tilt head down slightly and move head side to side for 45 seconds. Repeat while moving head up and down for 45 seconds. Do 2 sessions per day.   Copyright  VHI. All rights reserved.

## 2015-05-16 NOTE — Therapy (Addendum)
[facility] [address] [place], [place], [address] Phone: [phone]   Fax:  [phone]  Physical Therapy Treatment  Patient Details  Name: Joy Cain MRN: [id number] Date of Birth: 10-28-1935 Referring Provider: Cain Saupe, MD (Referred by hospitalist; pt requesting notes to PCP)  Encounter Date: 05/16/2015      PT End of Session - 05/16/15 1306    Visit Number 6   Number of Visits 9   Date for PT Re-Evaluation 06/08/15   Authorization Type Medicare - G Codes required   PT Start Time 1104   PT Stop Time 1145   PT Time Calculation (min) 41   Activity Tolerance Pt tolerated treatment well   Behavior During Therapy [facility] for tasks assessed      Past Medical History  Diagnosis Date  . Breast cancer (HCC)   . Asthma   . Osteoporosis   . GERD (gastroesophageal reflux disease)   . Dysphagia     Past Surgical History  Procedure Laterality Date  . Bunionectomy    . Hammer toe surgery    . Mastectomy      There were no vitals filed for this visit.  Visit Diagnosis:  Abnormality of gait  Unsteadiness      Subjective Assessment - 05/16/15 1110    Subjective "Still having trouble with that one-leg stance. It aggravates me."   Pertinent History Goes by "[employer]". PMH: breast cancer s/p L mastectomy (currently in remission), hereditary and idiopathic peripheral neuropathy, asthma, osteoporosis, GERD, mild chronic small vessel ischemic change    Patient Stated Goals "I want to find out if I need physical therapy; and if so, maybe you can help me with my balance."   Currently in Pain? No/denies                         [facility] Adult PT Treatment/Exercise - 05/16/15 0001    Ambulation/Gait   Ambulation/Gait Yes   Ambulation/Gait Assistance 6: Modified independent (Device/Increase time);5: Supervision   Ambulation/Gait Assistance Details Distant supervision for gait with horizontal head turns;  however, gait stability with head turns much improved as compared with previous session.   Ambulation Distance (Feet) 500 Feet   Assistive device None   Gait Pattern Step-through pattern;Narrow base of support;Left foot flat   Ambulation Surface Level;Indoor   Neuro Re-ed    Neuro Re-ed Details  Reviewed home exercises from prior sessions (performed only 10 reps of each) and progressed, as appropriate. Modified standing on pillow with EC and head turns from narrow BOS to wider BOS due to noted postural instability. Modified X1 viewing from 30 seconds to 45 seconds. Added wall bumps with EO on solid surface. See Pt Instructions for details on all exercises, reps, sets, frequency, and duration.             Balance Exercises - 05/16/15 1202    Balance Exercises: Standing   Standing Eyes Opened Narrow base of support (BOS);Head turns;Foam/compliant surface;Other reps (comment)  2 pillows;vertical, horizontal x15 each   Standing Eyes Closed Narrow base of support (BOS);Head turns;Foam/compliant surface;Other reps (comment);Wide (BOA);30 secs  2 pillows; head turns with wide BOS; static with narrow BOS   SLS Eyes open;10 secs;4 reps  Cueing to avoid holding onto chair while getting into SLS   Wall Bumps Hip   Wall Bumps-Hips Eyes opened;Eyes closed;Anterior/posterior;Foam/compliant surface;20 reps;15 reps;Other (comment)  pillow x1; EO x20 reps; EC x15 reps   Gait  with Head Turns Forward;4 reps  20' x2 reps with horizontal then vertical head turns   Tandem Gait Forward;Retro;2 reps  forward 2 x20'; retro 1 x15' due to difficulty   Other Standing Exercises heel/toe walking x50' then x75' each           PT Education - 05/16/15 1155    Education provided Yes   Education Details HEP progressed; see Pt Instructions.   Person(s) Educated Patient   Methods Explanation;Demonstration;Verbal cues;Handout   Comprehension Verbalized understanding;Returned demonstration          PT Short  Term Goals - 04/09/15 1825    PT SHORT TERM GOAL #1   Title STG's = LTG's           PT Long Term Goals - 05/13/15 1037    PT LONG TERM GOAL #1   Title Pt will perform initial HEP with mod I using paper handout to maximize functional gains made in PT. Target date: 06/04/15   Baseline Met 1/23.   Status Achieved   PT LONG TERM GOAL #2   Title Perform SOT to assess pt ability to utilize multi-sensory input for balance. Target date: 06/04/15   Baseline Composite = 71% on 1/12 and 63% on 1/23.   Status Achieved   PT LONG TERM GOAL #3   Title Improve SOT composite score by 8 points from baseline to indicate improved ability to utilize multi-sensory input for balance. Target date: 06/04/15   Status On-going   PT LONG TERM GOAL #4   Title Improve FGA score from 18/30 to > / = 23/30 to indicate improved dynamic gait stability, decreased fall risk. Target date: 06/04/15   Status On-going   PT LONG TERM GOAL #5   Title Pt will report no episodes of disequilibrium while bowling for 2 consecutive games to indicate return to PLOF. Target date: 06/04/15.   Baseline met 05/07/15 (pt reports she bowled all weekend and had no dizziness).    Status Achieved               Plan - 05/16/15 1308    Clinical Impression Statement Session focused on progressing HEP and increasing dynamic gait stability. Pt continues to exhibit decreased gait stability with horizontal head turns; however, this has improved as compared with previous sessions.    Pt will benefit from skilled therapeutic intervention in order to improve on the following deficits Abnormal gait;Decreased balance;Decreased coordination;Decreased strength;Other (comment)  disequilibrium   Rehab Potential Good   Clinical Impairments Affecting Rehab Potential Delay in start of treatment (per pt request) until 05/02/15.   PT Frequency 2x / week   PT Duration 4 weeks   PT Treatment/Interventions ADLs/Self Care Home Management;Balance  training;Therapeutic exercise;Therapeutic activities;Functional mobility training;Stair training;Gait training;Neuromuscular re-education;Canalith Repostioning;Patient/family education;Vestibular   PT Next Visit Plan Continue with high level balance and dynamic gait. Progress wall bump HEP (EC and/or on pillow).   Consulted and Agree with Plan of Care Patient        Problem List Patient Active Problem List   Diagnosis Date Noted  . Hereditary and idiopathic peripheral neuropathy 05/01/2015  . CVA (cerebral infarction) 02/03/2015  . Left sided numbness 02/03/2015  . Elevated blood pressure 02/03/2015  . Breast cancer of lower-outer quadrant of left female breast ([place]) 02/13/2014    Billie Ruddy, PT, DPT [facility] [address] [place], [place], [address] Phone: [phone]   Fax:  [phone] 05/16/2015, 1:16 PM   Name: Cambelle Suchecki MRN:  [id number] Date of Birth: 1935-09-05

## 2015-05-20 ENCOUNTER — Encounter: Payer: Self-pay | Admitting: Physical Therapy

## 2015-05-20 ENCOUNTER — Ambulatory Visit: Payer: Medicare Other | Admitting: Physical Therapy

## 2015-05-20 DIAGNOSIS — R2681 Unsteadiness on feet: Secondary | ICD-10-CM | POA: Diagnosis not present

## 2015-05-20 DIAGNOSIS — R269 Unspecified abnormalities of gait and mobility: Secondary | ICD-10-CM | POA: Diagnosis not present

## 2015-05-20 DIAGNOSIS — R278 Other lack of coordination: Secondary | ICD-10-CM | POA: Diagnosis not present

## 2015-05-20 DIAGNOSIS — Z7409 Other reduced mobility: Secondary | ICD-10-CM | POA: Diagnosis not present

## 2015-05-20 DIAGNOSIS — R42 Dizziness and giddiness: Secondary | ICD-10-CM | POA: Diagnosis not present

## 2015-05-21 NOTE — Therapy (Signed)
[facility] [address] [place], [place], [address] Phone: [phone]   Fax:  [phone]  Physical Therapy Treatment  Patient Details  Name: Joy Cain MRN: [id number] Date of Birth: 1936-01-28 Referring Provider: Antony Blackbird, MD (Referred by hospitalist; pt requesting notes to PCP)  Encounter Date: 05/20/2015      PT End of Session - 05/20/15 1107    Visit Number 7   Number of Visits 9   Date for PT Re-Evaluation 06/08/15   Authorization Type MCR trad primary - GCodes required   PT Start Time 1103   PT Stop Time 1145   PT Time Calculation (min) 42 min   Activity Tolerance Patient tolerated treatment well   Behavior During Therapy [facility] for tasks assessed/performed      Past Medical History  Diagnosis Date  . Breast cancer ([place])   . Asthma   . Osteoporosis   . GERD (gastroesophageal reflux disease)   . Dysphagia     Past Surgical History  Procedure Laterality Date  . Bunionectomy    . Hammer toe surgery    . Mastectomy      There were no vitals filed for this visit.  Visit Diagnosis:  Abnormality of gait  Unsteadiness  Disequilibrium  Coordination abnormal  Impaired functional mobility, balance, and endurance      Subjective Assessment - 05/20/15 1106    Subjective No new complaints. HEP program "is going", still having most trouble with standing on one leg. No falls or pain to report.   Pertinent History Goes by "[name]". PMH: breast cancer s/p L mastectomy (currently in remission), hereditary and idiopathic peripheral neuropathy, asthma, osteoporosis, GERD, mild chronic small vessel ischemic change    Patient Stated Goals "I want to find out if I need physical therapy; and if so, maybe you can help me with my balance."   Currently in Pain? No/denies             [facility] Adult PT Treatment/Exercise - 05/20/15 1108    Ambulation/Gait   Ambulation/Gait Yes   Ambulation/Gait Assistance  5: Supervision;4: Min guard   Ambulation/Gait Assistance Details gait along 50 foot hallway with head nods up<>down, turns left<>right and diagonals both ways with min guard assist to supervision. cues on maintaining gait speed and pathway.   Assistive device None   Gait Pattern Step-through pattern;Narrow base of support;Left foot flat   Ambulation Surface Level;Indoor             Balance Exercises - 05/20/15 1137    Balance Exercises: Standing   SLS with Vectors Foam/compliant surface;Other reps (comment);Limitations   Rockerboard Anterior/posterior;Lateral;Head turns;EO;EC;20 seconds;10 reps   Tandem Gait Forward;Retro;Foam/compliant surface;2 reps;Limitations   Step Over Hurdles / Cones reciprocal stepping over hurdles on red mats x 4 laps forward, min guard to min assist for balance with cues to slow down and for weight shifting during activity for improved stance control     Other Standing Exercises heel/ toe walking forward/backward x 2 laps each/each way on red mats with min guard to min assist for balance.   Balance Exercises: Standing   SLS with Vectors Limitations 6 cones along edge of red mats: alternating toe taps to each with side stepping, alternating double toe taps to each with side stepping and alternating flipping over/up with side stepping. 1 lap each/each way. min guard to min assist for balance.  Rebounder Limitations performed both ways on balance board: static hold EO, rocking EO, static hold EC, and static hold with EC with head movements up<>down, left<>right and diagonals both ways. min guard to min assist for balance with cues on posture and weight shifting for improved balance.                                           Tandem Gait Limitations on red mats x 3 laps each way with up to min assist for balance.             PT Short Term Goals - 04/09/15 1825    PT SHORT TERM GOAL #1   Title STG's = LTG's           PT Long  Term Goals - 05/13/15 1037    PT LONG TERM GOAL #1   Title Pt will perform initial HEP with mod I using paper handout to maximize functional gains made in PT. Target date: 06/04/15   Baseline Met 1/23.   Status Achieved   PT LONG TERM GOAL #2   Title Perform SOT to assess pt ability to utilize multi-sensory input for balance. Target date: 06/04/15   Baseline Composite = 71% on 1/12 and 63% on 1/23.   Status Achieved   PT LONG TERM GOAL #3   Title Improve SOT composite score by 8 points from baseline to indicate improved ability to utilize multi-sensory input for balance. Target date: 06/04/15   Status On-going   PT LONG TERM GOAL #4   Title Improve FGA score from 18/30 to > / = 23/30 to indicate improved dynamic gait stability, decreased fall risk. Target date: 06/04/15   Status On-going   PT LONG TERM GOAL #5   Title Pt will report no episodes of disequilibrium while bowling for 2 consecutive games to indicate return to PLOF. Target date: 06/04/15.   Baseline met 05/07/15 (pt reports she bowled all weekend and had no dizziness).    Status Achieved            Plan - 05/20/15 1108    Clinical Impression Statement Skilled session focued on balance and dynamic gait activities with no issues reported. Pt continues to need assistance on complaint surfaces and with single leg stance activites at times. Pt is making steady progress toward goals.   Pt will benefit from skilled therapeutic intervention in order to improve on the following deficits Abnormal gait;Decreased balance;Decreased coordination;Decreased strength;Other (comment)  disequilibrium   Rehab Potential Good   Clinical Impairments Affecting Rehab Potential Delay in start of treatment (per pt request) until 05/02/15.   PT Frequency 2x / week   PT Duration 4 weeks   PT Treatment/Interventions ADLs/Self Care Home Management;Balance training;Therapeutic exercise;Therapeutic activities;Functional mobility training;Stair training;Gait  training;Neuromuscular re-education;Canalith Repostioning;Patient/family education;Vestibular   PT Next Visit Plan Continue with high level balance and dynamic gait. Progress wall bump HEP (EC and/or on pillow).   Consulted and Agree with Plan of Care Patient        Problem List Patient Active Problem List   Diagnosis Date Noted  . Hereditary and idiopathic peripheral neuropathy 05/01/2015  . CVA (cerebral infarction) 02/03/2015  . Left sided numbness 02/03/2015  . Elevated blood pressure 02/03/2015  . Breast cancer of lower-outer quadrant of left female breast ([place]) 02/13/2014    Willow Ora 05/21/2015, 4:37 PM  Willow Ora, PTA,  [facility] [address] [place], [place] [address] [phone] 05/21/2015, 4:37 PM   Name: Joy Cain MRN: [id number] Date of Birth: 1935/12/07

## 2015-05-23 ENCOUNTER — Ambulatory Visit: Payer: Medicare Other | Attending: Family Medicine | Admitting: Physical Therapy

## 2015-05-23 DIAGNOSIS — R2681 Unsteadiness on feet: Secondary | ICD-10-CM | POA: Diagnosis not present

## 2015-05-23 DIAGNOSIS — R269 Unspecified abnormalities of gait and mobility: Secondary | ICD-10-CM | POA: Insufficient documentation

## 2015-05-23 DIAGNOSIS — R42 Dizziness and giddiness: Secondary | ICD-10-CM

## 2015-05-23 DIAGNOSIS — R278 Other lack of coordination: Secondary | ICD-10-CM | POA: Diagnosis not present

## 2015-05-23 NOTE — Patient Instructions (Addendum)
SINGLE LIMB STANCE    Stand in corner with chair in front of you. Stance: single leg on floor. Raise leg. Hold _15__ seconds. Repeat with other leg. _3__ reps per set, _1-2__ sets per day, _5__ days per week   Feet Together (Compliant Surface) Head Motion - Eyes Open    Stand in corner with chair in front of you. With eyes open, standing on compliant surface: __couch cushion____, feet together, move head slowly: up and down 15 reps. Then repeat side to side 15 reps. diagonally up/right to down left 15 times; and diagonally up/left to down/right 15 times. Repeat __1__ times per session. Do __1-2__ sessions per day.   Feet Apart (Compliant Surface) Head Motion - Eyes Closed     Stand on __a couch cushion__ with feet apart. Close eyes and move head slowly, up and down 15 times; side to side 15 times. Repeat __1__ times per session. Do __1-2__ sessions per day.   Weight Shift: Anterior / Posterior (Righting / Equilibrium)    BEGIN STANDING ON A COUCH CUSHION WITH EYES CLOSED WITH BACK LEANING AGAINST THE WALL AND HEELS 4 INCHES AWAY. (If the couch cushion begins sliding on the floor, try placing some shelf liner underneath the cushion).  Arch your back to use your hips to lift your body off the wall. Once in standing off the wall, hold for 5 seconds. Return slowly to the wall (don't break the glass!) letting your hips bump the wall and return to stand as described above.  Hold each position __5__ seconds. Repeat _15__ times per session. Do __1-2_ sessions per day.         Gaze Stabilization: Tip Card 1.Target must remain in focus, not blurry, and appear stationary while head is in motion. 2.Perform exercises with small head movements (45 to either side of midline). 3.Increase speed of head motion so long as target is in focus. 4.If you wear eyeglasses, be sure you can see target through lens (therapist will give specific instructions for bifocal / progressive  lenses). 5.These exercises may provoke dizziness or nausea. Work through these symptoms. If too dizzy, slow head movement slightly. Rest between each exercise. 6.Exercises demand concentration; avoid distractions. 7.For safety, perform standing exercises close to a counter, wall, corner, or next to someone.  Copyright  VHI. All rights reserved.  Gaze Stabilization: Standing Feet Apart   Feet shoulder width apart, keeping eyes on target on wall 5-6 feet away, tilt head down slightly and move head side to side for 60 seconds. Repeat while moving head up and down for 60 seconds.  Perform 2 times per day.

## 2015-05-23 NOTE — Therapy (Signed)
[facility] [address] [place], [place], [address] Phone: [phone]   Fax:  [phone]  Physical Therapy Treatment  Patient Details  Name: Joy Cain MRN: [id number] Date of Birth: 1936/01/19 Referring Provider: Antony Blackbird, MD (Referred by hospitalist; pt requesting notes to PCP)  Encounter Date: 05/23/2015      PT End of Session - 05/23/15 1423    Visit Number 8   Number of Visits 9   Date for PT Re-Evaluation 06/08/15   Authorization Type MCR trad primary - GCodes required   PT Start Time 1318   PT Stop Time 1400   PT Time Calculation (min) 42 min   Activity Tolerance Patient tolerated treatment well   Behavior During Therapy Joy Cain for tasks assessed/performed      Past Medical History  Diagnosis Date  . Breast cancer ([place])   . Asthma   . Osteoporosis   . GERD (gastroesophageal reflux disease)   . Dysphagia     Past Surgical History  Procedure Laterality Date  . Bunionectomy    . Hammer toe surgery    . Mastectomy      There were no vitals filed for this visit.  Visit Diagnosis:  Abnormality of gait  Unsteadiness  Disequilibrium      Subjective Assessment - 05/23/15 1321    Subjective Pt reports no falls and no significant changes.    Pertinent History Goes by "Joy Cain". PMH: breast cancer s/p L mastectomy (currently in remission), hereditary and idiopathic peripheral neuropathy, asthma, osteoporosis, GERD, mild chronic small vessel ischemic change    Patient Stated Goals "I want to find out if I need physical therapy; and if so, maybe you can help me with my balance."   Currently in Pain? No/denies                         [facility] Adult PT Treatment/Exercise - 05/23/15 0001    Ambulation/Gait   Ambulation/Gait --             Balance Exercises - 05/23/15 1349    Balance Exercises: Standing   Standing Eyes Opened Head turns;Foam/compliant surface;Other reps  (comment);Narrow base of support (BOS)  simulated couch cushion; vert/horizontal turns x10 each   Standing Eyes Closed Head turns;Foam/compliant surface;Other (comment);Wide (BOA)  simulated couch cushion; vert/horizontal turns x10 each   SLS Eyes open;Solid surface;10 secs;4 reps   Wall Bumps Hip   Wall Bumps-Hips Eyes closed;Other (comment);15 reps;Eyes opened  simulated couch cushion; x15 reps EO, x15 EC   Rockerboard Anterior/posterior;Lateral;EO;Other (comment);Head turns;30 seconds;5 reps  large board; M/L narrow BOS; A/P wide BOS diagonal turns x5   Balance Beam On blue foam beam at // bars, pt performed diagonal head turns x5 reps with min guard-min A, performed forward/retro stepping onto/off of beam x15 reps per side wih min guard, toe catch x2 total.   Tandem Gait Forward;3 reps;Limitations  arms out; 20' x3 trials   Other Standing Exercises LE braiding x40' with each LE leading with min guard due to intermittent toe catch             PT Short Term Goals - 04/09/15 1825    PT SHORT TERM GOAL #1   Title STG's = LTG's           PT Long Term Goals - 05/13/15 1037    PT LONG TERM GOAL #1   Title Pt will perform initial HEP with mod I using  paper handout to maximize functional gains made in PT. Target date: 06/04/15   Baseline Met 1/23.   Status Achieved   PT LONG TERM GOAL #2   Title Perform SOT to assess pt ability to utilize multi-sensory input for balance. Target date: 06/04/15   Baseline Composite = 71% on 1/12 and 63% on 1/23.   Status Achieved   PT LONG TERM GOAL #3   Title Improve SOT composite score by 8 points from baseline to indicate improved ability to utilize multi-sensory input for balance. Target date: 06/04/15   Status On-going   PT LONG TERM GOAL #4   Title Improve FGA score from 18/30 to > / = 23/30 to indicate improved dynamic gait stability, decreased fall risk. Target date: 06/04/15   Status On-going   PT LONG TERM GOAL #5   Title Pt will report  no episodes of disequilibrium while bowling for 2 consecutive games to indicate return to PLOF. Target date: 06/04/15.   Baseline met 05/07/15 (pt reports she bowled all weekend and had no dizziness).    Status Achieved               Plan - 05/23/15 1424    Clinical Impression Statement Session focused on progressing HEP and increasing dynamic gait stability. HEP progressed; noted significant improvement in postural stability during gaze stabilization. Pt did require prolonged standing rest break after diagonal head turns on large rocker board oriented for A/P instability. Pt in agreement with tentative plan for DC after next session.    Pt will benefit from skilled therapeutic intervention in order to improve on the following deficits Abnormal gait;Decreased balance;Decreased coordination;Decreased strength;Other (comment)  disequilibrium   Rehab Potential Good   Clinical Impairments Affecting Rehab Potential Delay in start of treatment (per pt request) until 05/02/15.   PT Frequency 2x / week   PT Duration 4 weeks   PT Treatment/Interventions ADLs/Self Care Home Management;Balance training;Therapeutic exercise;Therapeutic activities;Functional mobility training;Stair training;Gait training;Neuromuscular re-education;Canalith Repostioning;Patient/family education;Vestibular   PT Next Visit Plan Check goals. Plan to DC if goals met and appropriate.   Consulted and Agree with Plan of Care Patient        Problem List Patient Active Problem List   Diagnosis Date Noted  . Hereditary and idiopathic peripheral neuropathy 05/01/2015  . CVA (cerebral infarction) 02/03/2015  . Left sided numbness 02/03/2015  . Elevated blood pressure 02/03/2015  . Breast cancer of lower-outer quadrant of left female breast ([place]) 02/13/2014    Billie Ruddy, PT, DPT [facility] [address] [place], [place], [address] Phone: [phone]   Fax:   [phone] 05/23/2015, 2:41 PM   Name: Joy Cain MRN: [id number] Date of Birth: 05/13/1935

## 2015-05-27 ENCOUNTER — Ambulatory Visit: Payer: Medicare Other | Admitting: Physical Therapy

## 2015-05-27 DIAGNOSIS — R278 Other lack of coordination: Secondary | ICD-10-CM

## 2015-05-27 DIAGNOSIS — R269 Unspecified abnormalities of gait and mobility: Secondary | ICD-10-CM | POA: Diagnosis not present

## 2015-05-27 DIAGNOSIS — R2681 Unsteadiness on feet: Secondary | ICD-10-CM | POA: Diagnosis not present

## 2015-05-27 DIAGNOSIS — R42 Dizziness and giddiness: Secondary | ICD-10-CM | POA: Diagnosis not present

## 2015-05-27 NOTE — Therapy (Signed)
[facility] [address] [place], [place], [address] Phone: [phone]   Fax:  [phone]  Physical Therapy Treatment  Patient Details  Name: Joy Cain MRN: [id number] Date of Birth: 07-21-1935 Referring Provider: Antony Blackbird, MD (Referred by hospitalist; pt requesting notes to PCP)  Encounter Date: 05/27/2015      PT End of Session - 05/27/15 1242    Visit Number 9   Number of Visits 9   Date for PT Re-Evaluation 06/08/15   Authorization Type MCR trad primary - GCodes required   PT Start Time 1100   PT Stop Time 1145   PT Time Calculation (min) 45 min   Equipment Utilized During Treatment Other (comment)  Balance Master and harness   Activity Tolerance Patient tolerated treatment well   Behavior During Therapy [facility] for tasks assessed/performed      Past Medical History  Diagnosis Date  . Breast cancer ([place])   . Asthma   . Osteoporosis   . GERD (gastroesophageal reflux disease)   . Dysphagia     Past Surgical History  Procedure Laterality Date  . Bunionectomy    . Hammer toe surgery    . Mastectomy      There were no vitals filed for this visit.  Visit Diagnosis:  Coordination abnormal      Subjective Assessment - 05/27/15 1107    Subjective "I bowled a 200 Friday night."   Pt reports no falls or near-falls. HEP is going well. Pt expresses feeling ready for DC from PT today.   Pertinent History Goes by "[name]". PMH: breast cancer s/p L mastectomy (currently in remission), hereditary and idiopathic peripheral neuropathy, asthma, osteoporosis, GERD, mild chronic small vessel ischemic change    Patient Stated Goals "I want to find out if I need physical therapy; and if so, maybe you can help me with my balance."   Currently in Pain? No/denies            [facility] PT Assessment - 05/27/15 0001    Functional Gait  Assessment   Gait assessed  Yes   Gait Level Surface Walks 20 ft in less than 5.5  sec, no assistive devices, good speed, no evidence for imbalance, normal gait pattern, deviates no more than 6 in outside of the 12 in walkway width.  5.47 sec   Change in Gait Speed Able to smoothly change walking speed without loss of balance or gait deviation. Deviate no more than 6 in outside of the 12 in walkway width.   Gait with Horizontal Head Turns Performs head turns smoothly with slight change in gait velocity (eg, minor disruption to smooth gait path), deviates 6-10 in outside 12 in walkway width, or uses an assistive device.   Gait with Vertical Head Turns Performs head turns with no change in gait. Deviates no more than 6 in outside 12 in walkway width.   Gait and Pivot Turn Pivot turns safely within 3 sec and stops quickly with no loss of balance.   Step Over Obstacle Is able to step over 2 stacked shoe boxes taped together (9 in total height) without changing gait speed. No evidence of imbalance.   Gait with Narrow Base of Support Ambulates 7-9 steps.   Gait with Eyes Closed Walks 20 ft, uses assistive device, slower speed, mild gait deviations, deviates 6-10 in outside 12 in walkway width. Ambulates 20 ft in less than 9 sec but greater than 7 sec.   Ambulating Backwards Walks  20 ft, no assistive devices, good speed, no evidence for imbalance, normal gait   Steps Alternating feet, no rail.   Total Score 27            Vestibular Assessment - 2015/06/13 0001    Balancemaster   Therapist, occupational Comment SOT findings: Composite score 64%, which is equal to age/height-matched normative value. Pt demonstrates abnormal ability to utilize vestibular input for balance (vestibular 23% as compared with age/height-matched norm of 50%). Pt sustained 2 "falls" during condition 5.                         PT Education - Jun 13, 2015 1156    Education provided Yes   Education Details Goals, findings, progress, and DC plan.   Person(s)  Educated Patient   Methods Explanation;Handout   Comprehension Verbalized understanding          PT Short Term Goals - 04/09/15 1825    PT SHORT TERM GOAL #1   Title STG's = LTG's           PT Long Term Goals - 06-13-15 1139    PT LONG TERM GOAL #1   Title Pt will perform initial HEP with mod I using paper handout to maximize functional gains made in PT. Target date: 06/04/15   Baseline Met 1/23.   Status Achieved   PT LONG TERM GOAL #2   Title Perform SOT to assess pt ability to utilize multi-sensory input for balance. Target date: 06/04/15   Baseline Composite = 71% on 1/12 and 63% on 1/23; and 64% on 2/6.   Status Achieved   PT LONG TERM GOAL #3   Title Improve SOT composite score by 8 points from baseline to indicate improved ability to utilize multi-sensory input for balance. Target date: 06/04/15   Baseline SOT composite score WNL   Status Deferred   PT LONG TERM GOAL #4   Title Improve FGA score from 18/30 to > / = 23/30 to indicate improved dynamic gait stability, decreased fall risk. Target date: 06/04/15   Baseline 2/6: FGA score = 27/30   Status Achieved   PT LONG TERM GOAL #5   Title Pt will report no episodes of disequilibrium while bowling for 2 consecutive games to indicate return to PLOF. Target date: 06/04/15.   Baseline met 05/07/15 (pt reports she bowled all weekend and had no dizziness).    Status Achieved               Plan - 2015/06/13 1246    Clinical Impression Statement Patient has met all goals and reports no further dizziness, imbalance, LOB, or falls. Therefore, pt will be discharged from outpatient PT at this time. Noted that although SOT composite score (64%) is now equal with normative value for age/height, SOT does indicate that pt's ability to utilize vestibular input for balance has decreased since beginning this episode of PT. However, due to the fact that pt scored 27/30 on FGA and reports no functional limitations, pt will be discharged at  this time. Pt verbalized understanding and was in full agreement wth discharge plan.    Consulted and Agree with Plan of Care Patient          G-Codes - Jun 13, 2015 1156    Functional Assessment Tool Used FGA 27/30   Functional Limitation Mobility: Walking and moving around   Mobility: Walking and Moving Around Goal Status [phone]) At least 1 percent but  less than 20 percent impaired, limited or restricted   Mobility: Walking and Moving Around Discharge Status [phone]) At least 1 percent but less than 20 percent impaired, limited or restricted      Problem List Patient Active Problem List   Diagnosis Date Noted  . Hereditary and idiopathic peripheral neuropathy 05/01/2015  . CVA (cerebral infarction) 02/03/2015  . Left sided numbness 02/03/2015  . Elevated blood pressure 02/03/2015  . Breast cancer of lower-outer quadrant of left female breast ([place]) 02/13/2014   PHYSICAL THERAPY DISCHARGE SUMMARY  Visits from Start of Care: 9  Current functional level related to goals / functional outcomes: See all goals and statuses above.   Remaining deficits: Per SOT findings on discharge assessment, pt continues to demonstrate impaired ability to utilize vestibular input for balance. However, SOT composite score now equal to age/height normative values.   Education / Equipment: HEP and progression. Plan: Patient agrees to discharge.  Patient goals were met. Patient is being discharged due to meeting the stated rehab goals.  ?????       Billie Ruddy, PT, DPT [facility] [address] [place], [place], [address] Phone: [phone]   Fax:  [phone] 05/27/2015, 12:54 PM  Name: Lakoda Raske MRN: [id number] Date of Birth: 29-Jun-1935

## 2015-06-03 ENCOUNTER — Ambulatory Visit: Payer: Medicare Other | Admitting: Neurology

## 2015-06-03 ENCOUNTER — Ambulatory Visit: Payer: Medicare Other

## 2015-06-18 ENCOUNTER — Other Ambulatory Visit: Payer: Self-pay | Admitting: Family Medicine

## 2015-06-18 DIAGNOSIS — Z1231 Encounter for screening mammogram for malignant neoplasm of breast: Secondary | ICD-10-CM

## 2015-06-18 DIAGNOSIS — Z0000 Encounter for general adult medical examination without abnormal findings: Secondary | ICD-10-CM | POA: Diagnosis not present

## 2015-06-18 DIAGNOSIS — E2839 Other primary ovarian failure: Secondary | ICD-10-CM

## 2015-06-18 DIAGNOSIS — Z1239 Encounter for other screening for malignant neoplasm of breast: Secondary | ICD-10-CM | POA: Diagnosis not present

## 2015-06-18 DIAGNOSIS — Z23 Encounter for immunization: Secondary | ICD-10-CM | POA: Diagnosis not present

## 2015-06-18 DIAGNOSIS — R739 Hyperglycemia, unspecified: Secondary | ICD-10-CM | POA: Diagnosis not present

## 2015-06-18 DIAGNOSIS — Z136 Encounter for screening for cardiovascular disorders: Secondary | ICD-10-CM | POA: Diagnosis not present

## 2015-07-04 ENCOUNTER — Ambulatory Visit
Admission: RE | Admit: 2015-07-04 | Discharge: 2015-07-04 | Disposition: A | Discharge status: Home / Self Care | Payer: Medicare Other | Source: Ambulatory Visit | Attending: Family Medicine | Admitting: Family Medicine

## 2015-07-04 DIAGNOSIS — Z1231 Encounter for screening mammogram for malignant neoplasm of breast: Secondary | ICD-10-CM | POA: Diagnosis not present

## 2015-07-04 DIAGNOSIS — M810 Age-related osteoporosis without current pathological fracture: Secondary | ICD-10-CM | POA: Diagnosis not present

## 2015-07-04 DIAGNOSIS — E2839 Other primary ovarian failure: Secondary | ICD-10-CM

## 2015-09-19 HISTORY — PX: CHOLECYSTECTOMY: SHX55

## 2015-09-20 DIAGNOSIS — N838 Other noninflammatory disorders of ovary, fallopian tube and broad ligament: Secondary | ICD-10-CM | POA: Diagnosis not present

## 2015-09-20 DIAGNOSIS — Z853 Personal history of malignant neoplasm of breast: Secondary | ICD-10-CM | POA: Diagnosis not present

## 2015-09-20 DIAGNOSIS — N859 Noninflammatory disorder of uterus, unspecified: Secondary | ICD-10-CM | POA: Diagnosis not present

## 2015-09-20 DIAGNOSIS — K769 Liver disease, unspecified: Secondary | ICD-10-CM | POA: Diagnosis not present

## 2015-09-20 DIAGNOSIS — K828 Other specified diseases of gallbladder: Secondary | ICD-10-CM | POA: Diagnosis not present

## 2015-09-20 DIAGNOSIS — K811 Chronic cholecystitis: Secondary | ICD-10-CM | POA: Diagnosis not present

## 2015-09-20 DIAGNOSIS — R748 Abnormal levels of other serum enzymes: Secondary | ICD-10-CM | POA: Diagnosis not present

## 2015-09-20 DIAGNOSIS — M47816 Spondylosis without myelopathy or radiculopathy, lumbar region: Secondary | ICD-10-CM | POA: Diagnosis not present

## 2015-09-20 DIAGNOSIS — R1011 Right upper quadrant pain: Secondary | ICD-10-CM | POA: Diagnosis not present

## 2015-09-20 DIAGNOSIS — K8010 Calculus of gallbladder with chronic cholecystitis without obstruction: Secondary | ICD-10-CM | POA: Diagnosis not present

## 2015-09-20 DIAGNOSIS — K819 Cholecystitis, unspecified: Secondary | ICD-10-CM | POA: Diagnosis not present

## 2015-09-20 DIAGNOSIS — K5730 Diverticulosis of large intestine without perforation or abscess without bleeding: Secondary | ICD-10-CM | POA: Diagnosis not present

## 2015-09-20 DIAGNOSIS — Z9012 Acquired absence of left breast and nipple: Secondary | ICD-10-CM | POA: Diagnosis not present

## 2015-09-20 DIAGNOSIS — R079 Chest pain, unspecified: Secondary | ICD-10-CM | POA: Diagnosis not present

## 2015-09-20 DIAGNOSIS — R011 Cardiac murmur, unspecified: Secondary | ICD-10-CM | POA: Diagnosis not present

## 2015-09-20 DIAGNOSIS — K219 Gastro-esophageal reflux disease without esophagitis: Secondary | ICD-10-CM | POA: Diagnosis not present

## 2015-09-20 DIAGNOSIS — K810 Acute cholecystitis: Secondary | ICD-10-CM | POA: Diagnosis not present

## 2015-09-20 DIAGNOSIS — R109 Unspecified abdominal pain: Secondary | ICD-10-CM | POA: Diagnosis not present

## 2015-09-20 DIAGNOSIS — R0602 Shortness of breath: Secondary | ICD-10-CM | POA: Diagnosis not present

## 2015-09-20 DIAGNOSIS — R935 Abnormal findings on diagnostic imaging of other abdominal regions, including retroperitoneum: Secondary | ICD-10-CM | POA: Diagnosis not present

## 2015-09-20 DIAGNOSIS — K2990 Gastroduodenitis, unspecified, without bleeding: Secondary | ICD-10-CM | POA: Diagnosis not present

## 2015-09-20 DIAGNOSIS — Z9889 Other specified postprocedural states: Secondary | ICD-10-CM | POA: Diagnosis not present

## 2015-09-20 DIAGNOSIS — Z01810 Encounter for preprocedural cardiovascular examination: Secondary | ICD-10-CM | POA: Diagnosis not present

## 2015-09-20 DIAGNOSIS — C50919 Malignant neoplasm of unspecified site of unspecified female breast: Secondary | ICD-10-CM | POA: Diagnosis not present

## 2015-09-30 ENCOUNTER — Other Ambulatory Visit: Payer: Self-pay | Admitting: Family Medicine

## 2015-09-30 DIAGNOSIS — R935 Abnormal findings on diagnostic imaging of other abdominal regions, including retroperitoneum: Secondary | ICD-10-CM | POA: Diagnosis not present

## 2015-09-30 DIAGNOSIS — R8290 Unspecified abnormal findings in urine: Secondary | ICD-10-CM | POA: Diagnosis not present

## 2015-09-30 DIAGNOSIS — R1030 Lower abdominal pain, unspecified: Secondary | ICD-10-CM | POA: Diagnosis not present

## 2015-09-30 DIAGNOSIS — Z9049 Acquired absence of other specified parts of digestive tract: Secondary | ICD-10-CM | POA: Diagnosis not present

## 2015-09-30 DIAGNOSIS — R079 Chest pain, unspecified: Secondary | ICD-10-CM | POA: Diagnosis not present

## 2015-10-09 ENCOUNTER — Other Ambulatory Visit: Payer: Self-pay | Admitting: Family Medicine

## 2015-10-09 DIAGNOSIS — N838 Other noninflammatory disorders of ovary, fallopian tube and broad ligament: Secondary | ICD-10-CM

## 2015-10-14 ENCOUNTER — Ambulatory Visit
Admission: RE | Admit: 2015-10-14 | Discharge: 2015-10-14 | Disposition: A | Discharge status: Home / Self Care | Payer: Medicare Other | Source: Ambulatory Visit | Attending: Family Medicine | Admitting: Family Medicine

## 2015-10-14 DIAGNOSIS — D259 Leiomyoma of uterus, unspecified: Secondary | ICD-10-CM | POA: Diagnosis not present

## 2015-10-14 DIAGNOSIS — N838 Other noninflammatory disorders of ovary, fallopian tube and broad ligament: Secondary | ICD-10-CM

## 2015-11-04 ENCOUNTER — Other Ambulatory Visit: Payer: Self-pay | Admitting: Obstetrics and Gynecology

## 2015-11-04 ENCOUNTER — Other Ambulatory Visit (HOSPITAL_COMMUNITY)
Admission: RE | Admit: 2015-11-04 | Discharge: 2015-11-04 | Disposition: A | Discharge status: Home / Self Care | Payer: Medicare Other | Source: Ambulatory Visit | Attending: Obstetrics and Gynecology | Admitting: Obstetrics and Gynecology

## 2015-11-04 DIAGNOSIS — Z01419 Encounter for gynecological examination (general) (routine) without abnormal findings: Secondary | ICD-10-CM | POA: Insufficient documentation

## 2015-11-04 DIAGNOSIS — N838 Other noninflammatory disorders of ovary, fallopian tube and broad ligament: Secondary | ICD-10-CM | POA: Diagnosis not present

## 2015-11-04 DIAGNOSIS — N949 Unspecified condition associated with female genital organs and menstrual cycle: Secondary | ICD-10-CM | POA: Diagnosis not present

## 2015-11-04 DIAGNOSIS — N859 Noninflammatory disorder of uterus, unspecified: Secondary | ICD-10-CM | POA: Diagnosis not present

## 2015-11-04 DIAGNOSIS — Z1151 Encounter for screening for human papillomavirus (HPV): Secondary | ICD-10-CM | POA: Diagnosis not present

## 2015-11-04 DIAGNOSIS — N840 Polyp of corpus uteri: Secondary | ICD-10-CM | POA: Diagnosis not present

## 2015-11-04 DIAGNOSIS — R112 Nausea with vomiting, unspecified: Secondary | ICD-10-CM | POA: Diagnosis not present

## 2015-11-04 DIAGNOSIS — Z853 Personal history of malignant neoplasm of breast: Secondary | ICD-10-CM | POA: Diagnosis not present

## 2015-11-06 LAB — CYTOLOGY - PAP

## 2015-11-28 DIAGNOSIS — R011 Cardiac murmur, unspecified: Secondary | ICD-10-CM | POA: Diagnosis not present

## 2015-11-28 DIAGNOSIS — R0789 Other chest pain: Secondary | ICD-10-CM | POA: Diagnosis not present

## 2015-12-04 ENCOUNTER — Encounter: Payer: Self-pay | Admitting: Neurology

## 2015-12-04 ENCOUNTER — Ambulatory Visit (INDEPENDENT_AMBULATORY_CARE_PROVIDER_SITE_OTHER): Payer: Medicare Other | Admitting: Neurology

## 2015-12-04 VITALS — BP 130/70 | HR 66 | Ht 61.0 in | Wt 144.1 lb

## 2015-12-04 DIAGNOSIS — G609 Hereditary and idiopathic neuropathy, unspecified: Secondary | ICD-10-CM | POA: Diagnosis not present

## 2015-12-04 DIAGNOSIS — E538 Deficiency of other specified B group vitamins: Secondary | ICD-10-CM

## 2015-12-04 NOTE — Patient Instructions (Addendum)
You are looking great!   Return to clinic as needed

## 2015-12-04 NOTE — Progress Notes (Signed)
Follow-up Visit   Date: 12/04/15   Joy Cain MRN: [id number] DOB: [birth year]   Interim History: Joy Cain is a 80 y.o. right-handed Caucasian African American female with left breast cancer s/p mastectomy (1986), asthma, GERD, arthritis returning to the clinic for follow-up of left sided weakness and dizziness.  The patient was accompanied to the clinic by self.    History of present illness: Starting around early 2016, she began experiencing dizzy spells, described as room-spinning, which was short-lived and intermittent at first, but in mid-October, the dizziness became more constant and lasting all day which prompted her to go to the emergency department. It was triggered by quick positional changes, such as bending. She has noticed anything that helps, including meclizine. She is scheduled to see ENT next week. She has not done vestibular therapy.   Around the same time last week, she developed left hand numbness and left anterior lower leg and dorsum of the foot. Symptoms last several hours but can return without identifiable triggers. She feels her left arm and leg is fatigued and tired. She is not dropping items or having falls.   She was admitted to [facility] on 10/16 - 02/05/2015 for dizziness and left sided paresthesias. Neurology was consulted and recommended TIA evaluation which returned negative. MRI/A brain, echo, and US carotids was normal. She was started on plavix 48m daily due to aspirin allergy for secondary risk prevention and encouraged to see ENT as an out-patient.   She was also recently diagnosed with sinusitis and given a prescription for antibiotics, but has not started it yet.  No history of diabetes, alcohol, or chemotherapy  UPDATE 05/01/2015:  She reports feeling much better since drinking more fluids, walking, and taking vitamin B12 injections. NCS/EMG showed length-dependent sensorimotor neuropathy with mild involvement of the  upper extremity also.  She does not have any more dizzy spells or numbness of the left hand. She still is "wobbly" at times, but denies any falls.  She started balance training but has only completed the initial evaluation.  She saw ENT and had normal evaluation.  No new complaints.  She is back to her usual activities and is happy that she has been able to get back into bowling.  UPDATE 12/04/2015:  She has been doing great and does not have any new complaints.  She no longer has any paresthesias of the hands or dizziness.  She reports mild imbalance, but walks independently and has not had any falls.  She completed physical therapy but did not notice marked difference.   Medications:  Current Outpatient Prescriptions on File Prior to Visit  Medication Sig Dispense Refill  . vitamin B-12 (CYANOCOBALAMIN) 1000 MCG tablet Take 1,000 mcg by mouth daily.     No current facility-administered medications on file prior to visit.     Allergies:  Allergies  Allergen Reactions  . Aspirin Other (See Comments)    GI TRACT UPSET    Review of Systems:  CONSTITUTIONAL: No fevers, chills, night sweats, or weight loss.  EYES: No visual changes or eye pain ENT: No hearing changes.  No history of nose bleeds.   RESPIRATORY: No cough, wheezing and shortness of breath.   CARDIOVASCULAR: Negative for chest pain, and palpitations.   GI: Negative for abdominal discomfort, blood in stools or black stools.  No recent change in bowel habits.   GU:  No history of incontinence.   MUSCLOSKELETAL: No history of joint pain or swelling.  No myalgias.  SKIN: Negative for lesions, rash, and itching.   ENDOCRINE: Negative for cold or heat intolerance, polydipsia or goiter.   PSYCH:  No depression or anxiety symptoms.   NEURO: As Above.   Vital Signs:  BP 130/70   Pulse 66   Ht '5\' 1"'  (1.549 m)   Wt 144 lb 1 oz (65.3 kg)   SpO2 98%   BMI 27.22 kg/m   Neurological Exam: MENTAL STATUS including orientation to  time, place, person, recent and remote memory, attention span and concentration, language, and fund of knowledge is normal.  Speech is not dysarthric.  CRANIAL NERVES: Face is symmetric.   MOTOR:  Motor strength is 5/5 in all extremities.  Tone is normal.    MSRs:  Right                                                                 Left brachioradialis 2+  brachioradialis 2+  biceps 2+  biceps 2+  triceps 1+  triceps 1+  patellar 0  Patellar 0  ankle jerk 0  ankle jerk 0   SENSORY:  Vibration is diminished distal to ankles bilaterally.  Negative Rhomberg testing.  COORDINATION/GAIT: Gait narrow based and stable. She is unsteady with tandem gait.   Data: NCS/EMG 02/26/2015:  The electrophysiologic findings are most consistent with a length dependent sensorimotor polyneuropathy, axon loss in type, affecting the left side.  MRI/A brain 02/04/2015: MRI HEAD IMPRESSION: 1. No acute intracranial infarct or other process identified. 2. Mild to moderate chronic small vessel ischemic disease, similar to prior. 3. Mild mucosal thickening within the paranasal sinuses with air-fluid levels within the right sphenoid sinus, suggesting acute sinus disease.  MRA HEAD IMPRESSION: Normal MRA of the intracranial circulation.  Echo 02/04/2015: normal   US carotids 02/04/2015: Bilateral: intimal wall thickening CCA. 1-39% ICA plaquing. Vertebral artery flow is antegrade.  Labs 02/04/2015: HbA1c 5.7, ESR 26, TSH 1.68 Labs 02/13/2015:  Vitamin B12 245*, vitamin B6 9.3, copper 95, ceruloplasmin 26, SPEP/UPEP with IFE no M protein  IMPRESSION: Her orthostasis resolved with increasing water intake and starting vitamin B12 supplementation.  She has evidence of length dependent idiopathic polyneuropathy seen on NCS/EMG, but only has mild sensory ataxia and paresthesias.  She continues to walk independently and has not had any falls. Overall, she is doing great and in excellent health, which I praised  her for.  She will continue vitamin B12 1066mg daily since this helped with her hand paresthesias.  Return to clinic as needed  The duration of this appointment visit was 20 minutes of face-to-face time with the patient.  Greater than 50% of this time was spent in counseling, explanation of diagnosis, planning of further management, and coordination of care.   Thank you for allowing me to participate in patient's care.  If I can answer any additional questions, I would be pleased to do so.    Sincerely,    Donika K. PPosey Pronto DO

## 2015-12-09 DIAGNOSIS — R0789 Other chest pain: Secondary | ICD-10-CM | POA: Diagnosis not present

## 2015-12-09 DIAGNOSIS — R011 Cardiac murmur, unspecified: Secondary | ICD-10-CM | POA: Diagnosis not present

## 2015-12-12 DIAGNOSIS — R011 Cardiac murmur, unspecified: Secondary | ICD-10-CM | POA: Diagnosis not present

## 2015-12-12 DIAGNOSIS — R0789 Other chest pain: Secondary | ICD-10-CM | POA: Diagnosis not present

## 2015-12-26 DIAGNOSIS — R1311 Dysphagia, oral phase: Secondary | ICD-10-CM | POA: Diagnosis not present

## 2015-12-26 DIAGNOSIS — R0789 Other chest pain: Secondary | ICD-10-CM | POA: Diagnosis not present

## 2015-12-26 DIAGNOSIS — I351 Nonrheumatic aortic (valve) insufficiency: Secondary | ICD-10-CM | POA: Diagnosis not present

## 2016-01-07 ENCOUNTER — Other Ambulatory Visit (HOSPITAL_COMMUNITY): Payer: Self-pay | Admitting: Gastroenterology

## 2016-01-07 DIAGNOSIS — Z8719 Personal history of other diseases of the digestive system: Secondary | ICD-10-CM | POA: Diagnosis not present

## 2016-01-07 DIAGNOSIS — R1310 Dysphagia, unspecified: Secondary | ICD-10-CM

## 2016-01-07 DIAGNOSIS — K219 Gastro-esophageal reflux disease without esophagitis: Secondary | ICD-10-CM | POA: Diagnosis not present

## 2016-01-07 DIAGNOSIS — R1312 Dysphagia, oropharyngeal phase: Secondary | ICD-10-CM | POA: Diagnosis not present

## 2016-01-07 DIAGNOSIS — Z86010 Personal history of colonic polyps: Secondary | ICD-10-CM | POA: Diagnosis not present

## 2016-01-07 DIAGNOSIS — R1319 Other dysphagia: Secondary | ICD-10-CM

## 2016-01-07 DIAGNOSIS — R1314 Dysphagia, pharyngoesophageal phase: Secondary | ICD-10-CM | POA: Diagnosis not present

## 2016-01-09 ENCOUNTER — Other Ambulatory Visit (HOSPITAL_COMMUNITY): Payer: Self-pay | Admitting: Gastroenterology

## 2016-01-09 DIAGNOSIS — R1310 Dysphagia, unspecified: Secondary | ICD-10-CM

## 2016-01-15 ENCOUNTER — Ambulatory Visit (HOSPITAL_COMMUNITY): Payer: Medicare Other

## 2016-01-15 ENCOUNTER — Other Ambulatory Visit (HOSPITAL_COMMUNITY): Payer: Medicare Other

## 2016-01-28 ENCOUNTER — Inpatient Hospital Stay (HOSPITAL_COMMUNITY): Admission: RE | Admit: 2016-01-28 | Payer: Medicare Other | Source: Ambulatory Visit

## 2016-01-28 ENCOUNTER — Ambulatory Visit (HOSPITAL_COMMUNITY): Admission: RE | Admit: 2016-01-28 | Payer: Medicare Other | Source: Ambulatory Visit

## 2016-01-29 ENCOUNTER — Encounter: Payer: Self-pay | Admitting: Gastroenterology

## 2016-01-29 ENCOUNTER — Ambulatory Visit (HOSPITAL_COMMUNITY)
Admission: RE | Admit: 2016-01-29 | Discharge: 2016-01-29 | Disposition: A | Discharge status: Home / Self Care | Payer: Medicare Other | Source: Ambulatory Visit | Attending: Gastroenterology | Admitting: Gastroenterology

## 2016-01-29 ENCOUNTER — Other Ambulatory Visit (HOSPITAL_COMMUNITY): Payer: Self-pay | Admitting: Gastroenterology

## 2016-01-29 DIAGNOSIS — K224 Dyskinesia of esophagus: Secondary | ICD-10-CM | POA: Insufficient documentation

## 2016-01-29 DIAGNOSIS — R1319 Other dysphagia: Secondary | ICD-10-CM

## 2016-01-29 DIAGNOSIS — R1310 Dysphagia, unspecified: Secondary | ICD-10-CM

## 2016-01-29 DIAGNOSIS — K222 Esophageal obstruction: Secondary | ICD-10-CM | POA: Insufficient documentation

## 2016-01-29 DIAGNOSIS — T17308A Unspecified foreign body in larynx causing other injury, initial encounter: Secondary | ICD-10-CM | POA: Diagnosis not present

## 2016-01-29 NOTE — Progress Notes (Signed)
MBSS complete. Full report located under chart review in imaging section.  Joy Cain, M.A. CCC-SLP [phone]  

## 2016-02-11 DIAGNOSIS — N859 Noninflammatory disorder of uterus, unspecified: Secondary | ICD-10-CM | POA: Diagnosis not present

## 2016-02-20 ENCOUNTER — Encounter: Payer: Medicare Other | Admitting: Adult Health

## 2016-02-27 NOTE — Progress Notes (Deleted)
CLINIC:  Survivorship   REASON FOR VISIT:  Routine follow-up for history of breast cancer.   BRIEF ONCOLOGIC HISTORY:  (From Dr. Geralyn Flash last note on 02/21/15)    INTERVAL HISTORY:  Joy Cain presents to the [facility] today for routine follow-up for her history of breast cancer.  Overall, she reports feeling quite well. ***   REVIEW OF SYSTEMS:     GU: Denies vaginal bleeding, discharge, or dryness.  Breast: Denies any new nodularity, masses, tenderness, nipple changes, or nipple discharge.    A 14-point review of systems was completed and was negative, except as noted above.    PAST MEDICAL/SURGICAL HISTORY:  Past Medical History:  Diagnosis Date  . Asthma   . Breast cancer ([place])   . Dysphagia   . GERD (gastroesophageal reflux disease)   . Osteoporosis    Past Surgical History:  Procedure Laterality Date  . BUNIONECTOMY    . HAMMER TOE SURGERY    . MASTECTOMY       ALLERGIES:  Allergies  Allergen Reactions  . Aspirin Other (See Comments)    GI TRACT UPSET     CURRENT MEDICATIONS:  Outpatient Encounter Prescriptions as of 02/28/2016  Medication Sig  . Cholecalciferol (VITAMIN D PO) Take by mouth.  . vitamin B-12 (CYANOCOBALAMIN) 1000 MCG tablet Take 1,000 mcg by mouth daily.   No facility-administered encounter medications on file as of 02/28/2016.      ONCOLOGIC FAMILY HISTORY:  Family History  Problem Relation Age of Onset  . Heart disease Mother   . Stroke Father     Deceased, 56  . Cancer Sister   . Cancer Brother     GENETIC COUNSELING/TESTING: ***  SOCIAL HISTORY:  Joy Cain is /single/married/divorced/widowed/separated and lives alone/with her spouse/family/friend in (city), [place].  She has (#) children and they live in (city).  Joy Cain is currently retired/disabled/working part-time/full-time as ***.  She denies any current or history of tobacco, alcohol, or illicit drug use.     PHYSICAL EXAMINATION:    Vital Signs: There were no vitals filed for this visit. There were no vitals filed for this visit. General: Well-nourished, well-appearing female in no acute distress.  She is unaccompanied/accompanied in clinic by her ***** today.   HEENT: Head is normocephalic.  Pupils equal and reactive to light. Conjunctivae clear without exudate.  Sclerae anicteric. Oral mucosa is pink, moist.  Oropharynx is pink without lesions or erythema.  Lymph: No cervical, supraclavicular, or infraclavicular lymphadenopathy noted on palpation.  Cardiovascular: Regular rate and rhythm.Marland Kitchen Respiratory: Clear to auscultation bilaterally. Chest expansion symmetric; breathing non-labored.  Breast Exam:  -Left breast: No appreciable masses on palpation. No skin redness, thickening, or peau d'orange appearance; no nipple retraction or nipple discharge; mild distortion in symmetry at previous lumpectomy site***healed scar without erythema or nodularity.  -Right breast: No appreciable masses on palpation. No skin redness, thickening, or peau d'orange appearance; no nipple retraction or nipple discharge; mild distortion in symmetry at previous lumpectomy site***healed scar without erythema or nodularity. -Axilla: No axillary adenopathy bilaterally.  GI: Abdomen soft and round; non-tender, non-distended. Bowel sounds normoactive. No hepatosplenomegaly.   GU: Deferred.  Neuro: No focal deficits. Steady gait.  Psych: Mood and affect normal and appropriate for situation.  Extremities: No edema. Skin: Warm and dry.  LABORATORY DATA:  None for this visit***   DIAGNOSTIC IMAGING:  Most recent mammogram: 07/04/15     ASSESSMENT AND PLAN:  Joy Cain is a pleasant 80 y.o. female  with history of Stage *** right/left breast invasive ductal carcinoma, ER+/PR+/HER2-, diagnosed in (date), treated with lumpectomy, adjuvant radiation therapy, and anti-estrogen therapy with *** beginning in (date).  She presents to the Survivorship  Clinic for surveillance and routine follow-up.   1. History of breast cancer:  Joy Cain is currently clinically and radiographically without evidence of disease or recurrence of breast cancer. She will follow-up with her medical oncologist, Dr. Carrington Clamp or her surgeon, Dr. Domingo Madeira in their clinic in (#) months with labs and physical exam per surveillance protocol.  She will continue her endocrine therapy with *** as prescribed by Dr. Carrington Clamp. She was instructed to make me aware if she begins to experience any side effects of the medication and I would see her back in clinic to help manage those side effects, as needed.  #. Problem(s) at Visit___________________.  #. Bone Health:  Given Joy Cain's age, history of breast cancer, and her current anti-estrogen therapy with ________, she is at risk for bone demineralization. Her last DEXA scan was on **/**/20**.  In the meantime, she was encouraged to increase her consumption of foods rich in calcium, as well as increase her weight-bearing activities.  She was given education on specific food and activities to promote bone health.  #. Cancer screening:  Due to Joy Cain's history and her age, she should receive screening for skin cancers, colon cancer, and ***gynecologic cancers. She was encouraged to follow-up with her PCP for appropriate cancer screenings.   #. Health maintenance and wellness promotion: Joy Cain was encouraged to consume 5-7 servings of fruits and vegetables per day. She was also encouraged to engage in moderate to vigorous exercise for 30 minutes per day most days of the week. She was instructed to limit her alcohol consumption and continue to abstain from tobacco use/was encouraged stop smoking.  ***    Dispo:  -Return to cancer center ***   A total of (#) minutes of face-to-face time was spent with this patient with greater than 50% of that time in counseling and  care-coordination.   Joy Craze, NP Survivorship Program [facility] [phone]   Note: PRIMARY CARE PROVIDER Antony Blackbird, [place] [phone]

## 2016-02-28 ENCOUNTER — Encounter: Payer: Medicare Other | Admitting: Adult Health

## 2016-03-10 DIAGNOSIS — M6740 Ganglion, unspecified site: Secondary | ICD-10-CM | POA: Diagnosis not present

## 2016-03-10 DIAGNOSIS — Z23 Encounter for immunization: Secondary | ICD-10-CM | POA: Diagnosis not present

## 2016-03-10 DIAGNOSIS — G4452 New daily persistent headache (NDPH): Secondary | ICD-10-CM | POA: Diagnosis not present

## 2016-03-20 ENCOUNTER — Other Ambulatory Visit: Payer: Self-pay | Admitting: Family Medicine

## 2016-03-20 DIAGNOSIS — R519 Headache, unspecified: Secondary | ICD-10-CM

## 2016-03-20 DIAGNOSIS — R51 Headache: Principal | ICD-10-CM

## 2016-03-20 DIAGNOSIS — H9313 Tinnitus, bilateral: Secondary | ICD-10-CM

## 2016-03-26 DIAGNOSIS — K2210 Ulcer of esophagus without bleeding: Secondary | ICD-10-CM | POA: Diagnosis not present

## 2016-03-26 DIAGNOSIS — R933 Abnormal findings on diagnostic imaging of other parts of digestive tract: Secondary | ICD-10-CM | POA: Diagnosis not present

## 2016-03-26 DIAGNOSIS — K209 Esophagitis, unspecified: Secondary | ICD-10-CM | POA: Diagnosis not present

## 2016-03-26 DIAGNOSIS — R1310 Dysphagia, unspecified: Secondary | ICD-10-CM | POA: Diagnosis not present

## 2016-03-26 DIAGNOSIS — K2970 Gastritis, unspecified, without bleeding: Secondary | ICD-10-CM | POA: Diagnosis not present

## 2016-03-26 DIAGNOSIS — K2930 Chronic superficial gastritis without bleeding: Secondary | ICD-10-CM | POA: Diagnosis not present

## 2016-03-26 DIAGNOSIS — K222 Esophageal obstruction: Secondary | ICD-10-CM | POA: Diagnosis not present

## 2016-03-26 DIAGNOSIS — K228 Other specified diseases of esophagus: Secondary | ICD-10-CM | POA: Diagnosis not present

## 2016-03-31 ENCOUNTER — Other Ambulatory Visit: Payer: Self-pay | Admitting: Gastroenterology

## 2016-03-31 DIAGNOSIS — M79641 Pain in right hand: Secondary | ICD-10-CM | POA: Diagnosis not present

## 2016-03-31 DIAGNOSIS — R1310 Dysphagia, unspecified: Secondary | ICD-10-CM

## 2016-03-31 DIAGNOSIS — K2930 Chronic superficial gastritis without bleeding: Secondary | ICD-10-CM | POA: Diagnosis not present

## 2016-04-02 ENCOUNTER — Ambulatory Visit
Admission: RE | Admit: 2016-04-02 | Discharge: 2016-04-02 | Disposition: A | Discharge status: Home / Self Care | Payer: Medicare Other | Source: Ambulatory Visit | Attending: Family Medicine | Admitting: Family Medicine

## 2016-04-02 DIAGNOSIS — H9313 Tinnitus, bilateral: Secondary | ICD-10-CM

## 2016-04-02 DIAGNOSIS — R51 Headache: Principal | ICD-10-CM

## 2016-04-02 DIAGNOSIS — R519 Headache, unspecified: Secondary | ICD-10-CM

## 2016-04-02 MED ORDER — IOPAMIDOL (ISOVUE-300) INJECTION 61%
80.0000 mL | Freq: Once | INTRAVENOUS | Status: AC | PRN
  Administered 2016-04-02: 80 mL via INTRAVENOUS

## 2016-04-06 ENCOUNTER — Ambulatory Visit
Admission: RE | Admit: 2016-04-06 | Discharge: 2016-04-06 | Disposition: A | Discharge status: Home / Self Care | Payer: Medicare Other | Source: Ambulatory Visit | Attending: Gastroenterology | Admitting: Gastroenterology

## 2016-04-06 DIAGNOSIS — K209 Esophagitis, unspecified: Secondary | ICD-10-CM | POA: Diagnosis not present

## 2016-04-06 DIAGNOSIS — R1310 Dysphagia, unspecified: Secondary | ICD-10-CM

## 2016-04-28 DIAGNOSIS — G4452 New daily persistent headache (NDPH): Secondary | ICD-10-CM | POA: Diagnosis not present

## 2016-05-01 DIAGNOSIS — K219 Gastro-esophageal reflux disease without esophagitis: Secondary | ICD-10-CM | POA: Diagnosis not present

## 2016-05-01 DIAGNOSIS — K222 Esophageal obstruction: Secondary | ICD-10-CM | POA: Diagnosis not present

## 2016-05-01 DIAGNOSIS — R1319 Other dysphagia: Secondary | ICD-10-CM | POA: Diagnosis not present

## 2016-06-25 DIAGNOSIS — R1319 Other dysphagia: Secondary | ICD-10-CM | POA: Diagnosis not present

## 2016-06-25 DIAGNOSIS — K222 Esophageal obstruction: Secondary | ICD-10-CM | POA: Diagnosis not present

## 2016-06-25 DIAGNOSIS — K219 Gastro-esophageal reflux disease without esophagitis: Secondary | ICD-10-CM | POA: Diagnosis not present

## 2016-07-30 DIAGNOSIS — Z7689 Persons encountering health services in other specified circumstances: Secondary | ICD-10-CM | POA: Diagnosis not present

## 2016-07-30 DIAGNOSIS — R51 Headache: Secondary | ICD-10-CM | POA: Diagnosis not present

## 2016-07-30 DIAGNOSIS — Z79899 Other long term (current) drug therapy: Secondary | ICD-10-CM | POA: Diagnosis not present

## 2016-07-30 DIAGNOSIS — Z6827 Body mass index (BMI) 27.0-27.9, adult: Secondary | ICD-10-CM | POA: Diagnosis not present

## 2016-07-30 DIAGNOSIS — R1084 Generalized abdominal pain: Secondary | ICD-10-CM | POA: Diagnosis not present

## 2016-09-02 DIAGNOSIS — N859 Noninflammatory disorder of uterus, unspecified: Secondary | ICD-10-CM | POA: Diagnosis not present

## 2016-10-06 DIAGNOSIS — R51 Headache: Secondary | ICD-10-CM | POA: Diagnosis not present

## 2016-10-06 DIAGNOSIS — Z9012 Acquired absence of left breast and nipple: Secondary | ICD-10-CM | POA: Diagnosis not present

## 2016-10-06 DIAGNOSIS — Z1231 Encounter for screening mammogram for malignant neoplasm of breast: Secondary | ICD-10-CM | POA: Diagnosis not present

## 2016-10-20 ENCOUNTER — Telehealth: Payer: Self-pay | Admitting: Genetics

## 2016-10-20 ENCOUNTER — Encounter: Payer: Self-pay | Admitting: Genetics

## 2016-10-20 NOTE — Telephone Encounter (Signed)
Appt has been scheduled for the pt to see Vicente Males on 7/31 at 1pm. Will mail the pt a letter and fax to referring.

## 2016-11-04 ENCOUNTER — Telehealth: Payer: Self-pay | Admitting: Genetics

## 2016-11-04 NOTE — Telephone Encounter (Signed)
Pt cld to reschedule genetic counseling appt to 8/14 at 10am. Pt aware to arrive 30 minutes early.

## 2016-11-17 ENCOUNTER — Encounter: Payer: Medicare Other | Admitting: Genetics

## 2016-11-17 ENCOUNTER — Other Ambulatory Visit: Payer: Medicare Other

## 2016-12-01 ENCOUNTER — Ambulatory Visit (HOSPITAL_BASED_OUTPATIENT_CLINIC_OR_DEPARTMENT_OTHER): Payer: Medicare Other | Admitting: Genetics

## 2016-12-01 ENCOUNTER — Other Ambulatory Visit: Payer: Medicare Other

## 2016-12-01 ENCOUNTER — Encounter: Payer: Self-pay | Admitting: Genetics

## 2016-12-01 DIAGNOSIS — Z8042 Family history of malignant neoplasm of prostate: Secondary | ICD-10-CM

## 2016-12-01 DIAGNOSIS — Z803 Family history of malignant neoplasm of breast: Secondary | ICD-10-CM

## 2016-12-01 DIAGNOSIS — Z315 Encounter for genetic counseling: Secondary | ICD-10-CM

## 2016-12-01 DIAGNOSIS — Z853 Personal history of malignant neoplasm of breast: Secondary | ICD-10-CM | POA: Diagnosis not present

## 2016-12-01 DIAGNOSIS — Z809 Family history of malignant neoplasm, unspecified: Secondary | ICD-10-CM

## 2016-12-01 NOTE — Progress Notes (Signed)
REFERRING PROVIDER: Antony Blackbird, MD [phone], [place] [address]  PRIMARY PROVIDER:  Antony Blackbird, MD  PRIMARY REASON FOR VISIT:  1. Personal history of breast cancer   2. Family history of breast cancer   3. Family history of prostate cancer   4. Family history of cancer     HISTORY OF PRESENT ILLNESS:   [name], a 81 y.o. female, was seen for a [place] cancer genetics consultation at the request of Dr. Simona Huh due to a personal and family history of cancer. [name] states that she requested a genetic evaluation at the prompting of her daughter who recently underwent work-up for breast cancer with ultimately benign findings.  [name] presents to clinic today to discuss the possibility of a hereditary predisposition to cancer, genetic testing, and to further clarify her future cancer risks, as well as potential cancer risks for family members.   CANCER HISTORY: In 28, at the age of 78, [name] was diagnosed with cancer of the left breast. This was treated with mastectomy only.   Past Medical History:  Diagnosis Date  . Asthma   . Breast cancer ([place])   . Dysphagia   . GERD (gastroesophageal reflux disease)   . Osteoporosis     Past Surgical History:  Procedure Laterality Date  . BUNIONECTOMY    . HAMMER TOE SURGERY    . MASTECTOMY      Social History   Social History  . Marital status: Married    Spouse name: N/A  . Number of children: N/A  . Years of education: N/A   Social History Main Topics  . Smoking status: Never Smoker  . Smokeless tobacco: Never Used  . Alcohol use No  . Drug use: No  . Sexual activity: Not on file   Other Topics Concern  . Not on file   Social History Narrative   Lives with husband in a 2 story home.  Has 3 children.    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       FAMILY HISTORY:  We obtained a detailed, 4-generation family history.  Significant diagnoses are listed below: Family History  Problem  Relation Age of Onset  . Heart disease Mother        d.95  . Stroke Father        d.60  . Breast cancer Sister 21       d.70s from breast cancer  . Prostate cancer Brother        d.40s from prostate cancer  . Cancer Maternal Uncle        d.80s from unspecified form of cancer  . Breast cancer Sister 56  . Cancer Brother        d.80s from unspecified form of cancer  . Cancer Other        d.38s from unspecified form of cancer. This is the daughter of an unaffected brother.   [name] has a daughter, age 75, and two sons, ages 35 and 32. None have had cancers. [name] has five sisters and six brothers. Two of her sisters have had breast cancer. One sister was diagnosed in her late-60s and died from the disease in her 23s. The other sister was diagnosed at age 44 and is doing well at 58. The remaining three sisters are living without cancer at ages 3, 21, and 68. [name] has three living brothers without cancers at ages 19, 59, and 53. The 10 year old brother had  a daughter who died from an unspecified form of cancer in her late-30s. One of [name]'s brothers died in his 57s from prostate cancer. Another died in his 76s with an unspecified form of cancer. The last brother died in his 34s without cancers.   [name]'s mother died at 67 without cancers. Her mother had four sisters and one brother. Her sisters were not known to have cancer. Her brother died around the age of 55 with an unspecified form of cancer. [name] did not know her maternal grandfather. Her maternal grandmother died at 69 without cancer.  [name]'s father died at 52 from a stroke. He had three full brothers and one paternal half sister. [name] has no further information about these family members.  [name] is unaware of previous family history of genetic testing for hereditary cancer risks. Patient's maternal ancestors are of black and Native American descent, and paternal ancestors are of black descent. There  is no reported Ashkenazi Jewish ancestry. There is no known consanguinity.  GENETIC COUNSELING ASSESSMENT: Rasheen Schewe is a 81 y.o. female with a personal and family history which is somewhat suggestive of a hereditary cancer syndrome and predisposition to cancer. We, therefore, discussed and recommended the following at today's visit.   DISCUSSION: We reviewed the characteristics, features and inheritance patterns of hereditary cancer syndromes. We also discussed genetic testing, including the appropriate family members to test, the process of testing, insurance coverage and turn-around-time for results. We discussed the implications of a negative, positive and/or variant of uncertain significant result. We recommended Ms. Misko pursue genetic testing for the Common Hereditary Cancer Panel offered by [facility]. [facility]'s Common Hereditary Cancers Panel includes analysis of the following 46 genes: APC, ATM, AXIN2, BARD1, BMPR1A, BRCA1, BRCA2, BRIP1, CDH1, CDKN2A, CHEK2, CTNNA1, DICER1, EPCAM, GREM1, HOXB13, KIT, MEN1, MLH1, MSH2, MSH3, MSH6, MUTYH, NBN, NF1, NTHL1, PALB2, PDGFRA, PMS2, POLD1, POLE, PTEN, RAD50, RAD51C, RAD51D, SDHA, SDHB, SDHC, SDHD, SMAD4, SMARCA4, STK11, TP53, TSC1, TSC2, and VHL.   Based on Ms. Oyama's personal and family history of cancer, she meets medical criteria for genetic testing. Despite that she meets criteria, she may still have an out of pocket cost. We discussed that if her out of pocket cost for testing is over $100, the laboratory will call and confirm whether she wants to proceed with testing.  If the out of pocket cost of testing is less than $100 she will be billed by the genetic testing laboratory.   PLAN: After considering the risks, benefits, and limitations, Ms. Donze  provided informed consent to pursue genetic testing and the blood sample was sent to [facility] for analysis of the 46-gene Common Hereditary Cancers Panel. Results should be available within  approximately 3 weeks' time, at which point they will be disclosed by telephone to Ms. Timoney, as will any additional recommendations warranted by these results. Ms. Dino will receive a summary of her genetic counseling visit and a copy of her results once available. This information will also be available in Epic.  Lastly, we encouraged Ms. Atkins to remain in contact with cancer genetics annually so that we can continuously update the family history and inform her of any changes in cancer genetics and testing that may be of benefit for this family.   Ms.  Fawaz questions were answered to her satisfaction today. Our contact information was provided should additional questions or concerns arise. Thank you for the referral and allowing [place] to share in the care of  your patient.   Mal Misty, MS, [facility] Certified Naval [email or web address] phone: [phone]  The patient was seen for a total of 35 minutes in face-to-face genetic counseling.    _______________________________________________________________________ For Office Staff:  Number of people involved in session: 1 Was an Intern/ student involved with case: yes

## 2016-12-11 DIAGNOSIS — H40013 Open angle with borderline findings, low risk, bilateral: Secondary | ICD-10-CM | POA: Diagnosis not present

## 2016-12-11 DIAGNOSIS — G44229 Chronic tension-type headache, not intractable: Secondary | ICD-10-CM | POA: Diagnosis not present

## 2016-12-11 DIAGNOSIS — H25813 Combined forms of age-related cataract, bilateral: Secondary | ICD-10-CM | POA: Diagnosis not present

## 2016-12-11 DIAGNOSIS — H0011 Chalazion right upper eyelid: Secondary | ICD-10-CM | POA: Diagnosis not present

## 2016-12-23 ENCOUNTER — Telehealth: Payer: Self-pay | Admitting: Genetics

## 2016-12-25 DIAGNOSIS — M8589 Other specified disorders of bone density and structure, multiple sites: Secondary | ICD-10-CM | POA: Diagnosis not present

## 2016-12-25 DIAGNOSIS — Z01419 Encounter for gynecological examination (general) (routine) without abnormal findings: Secondary | ICD-10-CM | POA: Diagnosis not present

## 2016-12-25 DIAGNOSIS — E559 Vitamin D deficiency, unspecified: Secondary | ICD-10-CM | POA: Diagnosis not present

## 2016-12-29 DIAGNOSIS — H16143 Punctate keratitis, bilateral: Secondary | ICD-10-CM | POA: Diagnosis not present

## 2016-12-29 DIAGNOSIS — H2513 Age-related nuclear cataract, bilateral: Secondary | ICD-10-CM | POA: Diagnosis not present

## 2016-12-30 NOTE — Telephone Encounter (Signed)
Revealed negative genetic testing.  Revealed that 2 variants of uncertain significance were identified in the genes MLH1 and MUTYH. Discussed that we do not know why she has breast cancer or why there is cancer in the family. It could be due to a different gene that we are not testing, or maybe our current technology may not be able to pick something up.  It will be important for her to keep in contact with genetics to keep up with whether additional testing may be needed. Recommended her siblings/other relatives consider genetic testing as well.

## 2017-01-01 ENCOUNTER — Encounter: Payer: Self-pay | Admitting: Genetics

## 2017-01-01 ENCOUNTER — Ambulatory Visit: Payer: Self-pay | Admitting: Genetics

## 2017-01-01 DIAGNOSIS — Z1379 Encounter for other screening for genetic and chromosomal anomalies: Secondary | ICD-10-CM

## 2017-01-01 DIAGNOSIS — Z803 Family history of malignant neoplasm of breast: Secondary | ICD-10-CM

## 2017-01-01 DIAGNOSIS — Z809 Family history of malignant neoplasm, unspecified: Secondary | ICD-10-CM

## 2017-01-01 DIAGNOSIS — Z8042 Family history of malignant neoplasm of prostate: Secondary | ICD-10-CM

## 2017-01-01 DIAGNOSIS — Z853 Personal history of malignant neoplasm of breast: Secondary | ICD-10-CM

## 2017-01-01 NOTE — Progress Notes (Signed)
HPI: Ms. Gatchell was previously seen in the [place] clinic on 12/01/2016 due to a personal history of breast cancer and concerns regarding a hereditary predisposition to cancer. Please refer to our prior cancer genetics clinic note for more information regarding Ms. Bettes's medical, social and family histories, and our assessment and recommendations, at the time. Ms. Bogosian recent genetic test results were disclosed to her, as well as recommendations warranted by these results. These results and recommendations are discussed in more detail below.   FAMILY HISTORY:  We obtained a detailed, 4-generation family history.  Significant diagnoses are listed below: Family History  Problem Relation Age of Onset  . Heart disease Mother        d.95  . Stroke Father        d.60  . Breast cancer Sister 84       d.70s from breast cancer  . Prostate cancer Brother        d.40s from prostate cancer  . Cancer Maternal Uncle        d.80s from unspecified form of cancer  . Breast cancer Sister 56  . Cancer Brother        d.80s from unspecified form of cancer  . Cancer Other        d.38s from unspecified form of cancer. This is the daughter of an unaffected brother.   Ms. Forquer has a daughter, age 52, and two sons, ages 58 and 74. None have had cancers. Ms. Gillock has five sisters and six brothers. Two of her sisters have had breast cancer. One sister was diagnosed in her late-60s and died from the disease in her 47s. The other sister was diagnosed at age 69 and is doing well at 39. The remaining three sisters are living without cancer at ages 60, 62, and 70. Ms. Blow has three living brothers without cancers at ages 58, 61, and 4. The 72 year old brother had a daughter who died from an unspecified form of cancer in her late-30s. One of Ms. Tuckerman's brothers died in his 47s from prostate cancer. Another died in his 9s with an unspecified form of cancer. The last brother died in his 29s without  cancers.   Ms. Homann's mother died at 83 without cancers. Her mother had four sisters and one brother. Her sisters were not known to have cancer. Her brother died around the age of 43 with an unspecified form of cancer. Ms. Krummel did not know her maternal grandfather. Her maternal grandmother died at 19 without cancer.  Ms. Baugher's father died at 50 from a stroke. He had three full brothers and one paternal half sister. Ms. Crago has no further information about these family members.  Ms. Gruner is unaware of previous family history of genetic testing for hereditary cancer risks. Patient's maternal ancestors are of black and Native American descent, and paternal ancestors are of black descent. There is no reported Ashkenazi Jewish ancestry. There is no known consanguinity.  GENETIC TEST RESULTS: Genetic testing performed through [facility]'s Common Hereditary Cancer Panel reported out on 12/13/2016 showed no pathogenic mutations. The Hereditary Gene Panel offered by [facility] includes sequencing and/or deletion duplication testing of the following 46 genes: APC, ATM, AXIN2, BARD1, BMPR1A, BRCA1, BRCA2, BRIP1, CDH1, CDKN2A (p14ARF), CDKN2A (p16INK4a), CHEK2, CTNNA1, DICER1, EPCAM (Deletion/duplication testing only), GREM1 (promoter region deletion/duplication testing only), KIT, MEN1, MLH1, MSH2, MSH3, MSH6, MUTYH, NBN, NF1, NHTL1, PALB2, PDGFRA, PMS2, POLD1, POLE, PTEN, RAD50, RAD51C, RAD51D, SDHB, SDHC, SDHD, SMAD4, SMARCA4. STK11,  TP53, TSC1, TSC2, and VHL.  The following genes were evaluated for sequence changes only: SDHA and HOXB13 c.251G>A variant only..  A variant of uncertain significance (VUS) in a gene called MLH1 c.1153C>Twas noted. A variant of uncertain significance (VUS) in a gene called MUTYH c.933A>T was also noted.  The test report will be scanned into EPIC and will be located under the Molecular Pathology section of the Results Review tab.A portion of the result report is included below  for reference.      We discussed with Ms. Tipping that because current genetic testing is not perfect, it is possible there may be a gene mutation in one of these genes that current testing cannot detect, but that chance is small. We also discussed, that there could be another gene that has not yet been discovered, or that we have not yet tested, that is responsible for the cancer diagnoses in the family.  It is also possible that there is a hereditary cause for the cancer in Ms. Jaskot's family that she did not inherit and therefore was not detected in her.Therefore, it is important to remain in touch with cancer genetics in the future so that we can continue to offer Ms. Kushner the most up to date genetic testing.   Regarding the VUS's in [place] and MUTYH: At this time, it is unknown if these variants are associated with increased cancer risk or if they are normal findings, but most variants such as these get reclassified to being inconsequential. they should not be used to make medical management decisions. With time, we suspect the lab will determine the significance of this variant, if any. If we do learn more about it, we will try to contact Ms. Bodiford to discuss it further. However, it is important to stay in touch with [place] periodically and keep the address and phone number up to date.  ADDITIONAL GENETIC TESTING: We discussed with Ms. Leija that there are other genes that are associated with increased cancer risk that can be analyzed. The laboratories that offer this testing look at these additional genes via a hereditary cancer gene panel. Should Ms. Bahena wish to pursue additional genetic testing, we are happy to discuss and coordinate this testing, at any time.    CANCER SCREENING RECOMMENDATIONS: This result indicates that it is unlikely Ms. Rotunno has an increased risk for a future cancer due to a mutation in one of these genes. This normal test also suggests that Ms. Magnussen's cancer was most likely  not due to an inherited predisposition associated with one of these genes.  Most cancers happen by chance and this negative test suggests that her cancer may fall into this category.  Therefore, it is recommended she continue to follow the cancer management and screening guidelines provided by her oncology and primary healthcare provider. Other factors such as her personal and family history may still affect her cancer risk.  Genetic testing does not rule out a hereditary basis for her or her family's cancer.   RECOMMENDATIONS FOR FAMILY MEMBERS: Women in this family might be at some increased risk of developing cancer, over the general population risk, simply due to the family history of cancer. We recommended women in this family have a yearly mammogram beginning at age 61, or 85 years younger than the earliest onset of cancer, an annual clinical breast exam, and perform monthly breast self-exams. Women in this family should also have a gynecological exam as recommended by their primary provider. All family members  should have a colonoscopy by age 38.  Based on Ms. Folino's family history, we recommended her siblings and nieces/nephews consider genetic counseling and testing. Ms. Tremblay will let us know if we can be of any assistance in coordinating genetic counseling and/or testing for this family member.   FOLLOW-UP: Lastly, we discussed with Ms. Maciver that cancer genetics is a rapidly advancing field and it is possible that new genetic tests will be appropriate for her and/or her family members in the future. We encouraged her to remain in contact with cancer genetics on an annual basis so we can update her personal and family histories and let her know of advances in cancer genetics that may benefit this family.   Our contact number was provided. Ms. Parma questions were answered to her satisfaction, and she knows she is welcome to call us at anytime with additional questions or concerns.   Ferol Luz, MS Genetic Counselor [email or web address]

## 2017-01-18 DIAGNOSIS — I351 Nonrheumatic aortic (valve) insufficiency: Secondary | ICD-10-CM | POA: Diagnosis not present

## 2017-01-18 DIAGNOSIS — R0789 Other chest pain: Secondary | ICD-10-CM | POA: Diagnosis not present

## 2017-01-18 DIAGNOSIS — I10 Essential (primary) hypertension: Secondary | ICD-10-CM | POA: Diagnosis not present

## 2017-01-25 DIAGNOSIS — R0789 Other chest pain: Secondary | ICD-10-CM | POA: Diagnosis not present

## 2017-02-04 NOTE — Patient Instructions (Signed)
Your procedure is scheduled on:  Wednesday, Oct. 31  Enter through the [employer] of [facility] at:  11 am  Pick up the phone at the desk and dial [phone].  Call this number if you have problems the morning of surgery: [phone].  Remember: Do NOT eat food or drink clear liquids (including water) after midnight Tuesday  Take these medicines the morning of surgery with a SIP OF WATER:  None  Bring inhaler with you on day of surgery.  Do Not smoke on the day of surgery.  Stop herbal medications and supplements at this time.  .Do NOT wear jewelry (body piercing), metal hair clips/bobby pins, make-up, or nail polish. Do NOT wear lotions, powders, or perfumes.  You may wear deoderant. Do NOT shave for 48 hours prior to surgery. Do NOT bring valuables to the hospital. Contacts, dentures, or bridgework may not be worn into surgery.  Leave suitcase in car.  After surgery it may be brought to your room.  For patients admitted to the hospital, checkout time is 11:00 AM the day of discharge. Have a responsible adult drive you home and stay with you for 24 hours after your procedure

## 2017-02-08 ENCOUNTER — Inpatient Hospital Stay (HOSPITAL_COMMUNITY)
Admission: RE | Admit: 2017-02-08 | Discharge: 2017-02-08 | Disposition: A | Discharge status: Home / Self Care | Payer: Medicare Other | Source: Ambulatory Visit

## 2017-02-08 DIAGNOSIS — I351 Nonrheumatic aortic (valve) insufficiency: Secondary | ICD-10-CM | POA: Diagnosis not present

## 2017-02-08 DIAGNOSIS — I10 Essential (primary) hypertension: Secondary | ICD-10-CM | POA: Diagnosis not present

## 2017-02-08 DIAGNOSIS — R0789 Other chest pain: Secondary | ICD-10-CM | POA: Diagnosis not present

## 2017-02-17 ENCOUNTER — Ambulatory Visit (HOSPITAL_COMMUNITY)
Admission: AD | Admit: 2017-02-17 | Payer: Medicare Other | Source: Ambulatory Visit | Admitting: Obstetrics and Gynecology

## 2017-02-17 ENCOUNTER — Encounter (HOSPITAL_COMMUNITY): Admission: AD | Payer: Self-pay | Source: Ambulatory Visit

## 2017-02-17 SURGERY — DILATATION & CURETTAGE/HYSTEROSCOPY WITH MYOSURE
Anesthesia: Choice

## 2017-03-25 DIAGNOSIS — H0011 Chalazion right upper eyelid: Secondary | ICD-10-CM | POA: Diagnosis not present

## 2017-03-25 DIAGNOSIS — H0288A Meibomian gland dysfunction right eye, upper and lower eyelids: Secondary | ICD-10-CM | POA: Diagnosis not present

## 2017-04-01 DIAGNOSIS — H0011 Chalazion right upper eyelid: Secondary | ICD-10-CM | POA: Diagnosis not present

## 2017-05-17 DIAGNOSIS — Z23 Encounter for immunization: Secondary | ICD-10-CM | POA: Diagnosis not present

## 2017-05-17 DIAGNOSIS — J069 Acute upper respiratory infection, unspecified: Secondary | ICD-10-CM | POA: Diagnosis not present

## 2017-05-17 DIAGNOSIS — M069 Rheumatoid arthritis, unspecified: Secondary | ICD-10-CM | POA: Diagnosis not present

## 2017-06-29 DIAGNOSIS — M8588 Other specified disorders of bone density and structure, other site: Secondary | ICD-10-CM | POA: Diagnosis not present

## 2017-07-06 ENCOUNTER — Ambulatory Visit: Payer: Self-pay | Admitting: Podiatry

## 2017-07-08 ENCOUNTER — Ambulatory Visit (INDEPENDENT_AMBULATORY_CARE_PROVIDER_SITE_OTHER): Payer: Medicare Other | Admitting: Podiatry

## 2017-07-08 ENCOUNTER — Ambulatory Visit (INDEPENDENT_AMBULATORY_CARE_PROVIDER_SITE_OTHER): Payer: Medicare Other

## 2017-07-08 ENCOUNTER — Encounter: Payer: Self-pay | Admitting: Podiatry

## 2017-07-08 VITALS — BP 150/61 | HR 85 | Resp 18

## 2017-07-08 DIAGNOSIS — M779 Enthesopathy, unspecified: Secondary | ICD-10-CM | POA: Diagnosis not present

## 2017-07-08 DIAGNOSIS — M21619 Bunion of unspecified foot: Secondary | ICD-10-CM

## 2017-07-08 MED ORDER — DICLOFENAC SODIUM 1 % TD GEL: 2.0000 g | Freq: Four times a day (QID) | TRANSDERMAL | 2 refills | Status: DC

## 2017-07-09 NOTE — Progress Notes (Signed)
Subjective:   Patient ID: Joy Cain, female   DOB: 82 y.o.   MRN: [id number]   HPI 82 year old female presents the office today for concerns of a bunion to her left foot.  She states that she had surgery several years ago and it did recur in which is causing pain with pressure issues.  She has tried numerous topical implants as well as shoe changes any significant resolution of the pain.  She states that it hurts on the bunion itself but she tries to bend the toe.  No recent injury to the area.  She denies any numbness or tingling.  She has no other concerns today.   Review of Systems  All other systems reviewed and are negative.  Past Medical History:  Diagnosis Date  . Asthma   . Breast cancer ([place])   . Dysphagia   . GERD (gastroesophageal reflux disease)   . Osteoporosis     Past Surgical History:  Procedure Laterality Date  . BUNIONECTOMY    . HAMMER TOE SURGERY    . MASTECTOMY       Current Outpatient Medications:  .  Calcium Carb-Cholecalciferol (CALCIUM 500+D PO), Take 2 tablets by mouth daily., Disp: , Rfl:  .  Cholecalciferol (VITAMIN D) 2000 units CAPS, Take 2 tablets by mouth daily. , Disp: , Rfl:  .  diclofenac sodium (VOLTAREN) 1 % GEL, Apply 2 g topically 4 (four) times daily. Rub into affected area of foot 2 to 4 times daily, Disp: 100 g, Rfl: 2 .  FIBER COMPLETE PO, Take 1 capsule by mouth daily., Disp: , Rfl:  .  Glucosamine-Chondroit-Vit C-Mn (GLUCOSAMINE CHONDR 500 COMPLEX PO), Take 5 mLs by mouth daily., Disp: , Rfl:  .  Propylene Glycol (SYSTANE BALANCE) 0.6 % SOLN, Place 1-2 drops into both eyes 4 (four) times daily as needed (DRY EYES)., Disp: , Rfl:  .  vitamin B-12 (CYANOCOBALAMIN) 1000 MCG tablet, Take 1,000 mcg by mouth daily., Disp: , Rfl:   Allergies  Allergen Reactions  . Aspirin Other (See Comments)    GI TRACT UPSET    Social History   Socioeconomic History  . Marital status: Married    Spouse name: Not on file  . Number of children:  Not on file  . Years of education: Not on file  . Highest education level: Not on file  Occupational History  . Not on file  Social Needs  . Financial resource strain: Not on file  . Food insecurity:    Worry: Not on file    Inability: Not on file  . Transportation needs:    Medical: Not on file    Non-medical: Not on file  Tobacco Use  . Smoking status: Never Smoker  . Smokeless tobacco: Never Used  Substance and Sexual Activity  . Alcohol use: No    Alcohol/week: 0.0 oz  . Drug use: No  . Sexual activity: Not on file  Lifestyle  . Physical activity:    Days per week: Not on file    Minutes per session: Not on file  . Stress: Not on file  Relationships  . Social connections:    Talks on phone: Not on file    Gets together: Not on file    Attends religious service: Not on file    Active member of club or organization: Not on file    Attends meetings of clubs or organizations: Not on file    Relationship status: Not on file  . Intimate partner  violence:    Fear of current or ex partner: Not on file    Emotionally abused: Not on file    Physically abused: Not on file    Forced sexual activity: Not on file  Other Topics Concern  . Not on file  Social History Narrative   Lives with husband in a 2 story home.  Has 3 children.    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           Objective:  Physical Exam  General: AAO x3, NAD  Dermatological: Hyperkeratotic tissue is present along the left medial first metatarsal along the bunion.  There is no underlying ulceration, drainage or any clinical signs of infection noted today.  Vascular: Dorsalis Pedis artery and Posterior Tibial artery pedal pulses are 2/4 bilateral with immedate capillary fill time.  There is no pain with calf compression, swelling, warmth, erythema.   Neruologic: Grossly intact via light touch bilateral.  Protective threshold with Semmes Wienstein monofilament intact to all pedal sites bilateral.    Musculoskeletal: Moderate HAV is present on the left foot there is tenderness along the bunion on the medial first metatarsal head.  There is also pain with MPJ range of motion and the range of motion is decreased.  There is no other area of tenderness identified this time.  Muscular strength 5/5 in all groups tested bilateral.  Gait: Unassisted, Nonantalgic.      Assessment:   Left HAV     Plan:  -Treatment options discussed including all alternatives, risks, and complications -Etiology of symptoms were discussed -X-rays were obtained and reviewed with the patient.  History of previous first metatarsal osteotomy is present.  Hallux abductus is present.  No evidence of acute fracture. -We discussed both conservative as well as surgical treatment options.  We discussed multiple treatment options conservatively including shoe modifications, orthotics, injections, padding.  We discussed surgery as well as postoperative course.  We discussed potentially first MPJ arthrodesis versus Keller arthroplasty.  At this time she wished to proceed with a steroid injection.  After the area was cleaned with Betadine a mixture of 1 cc Kenalog 10, 0.5 cc of 0.5% Marcaine plain and 0.5 cc of 1% lidocaine plain was infiltrated into the first MPJ without any complications.  Postinjection care was discussed.  Patient was requesting a surgical shoe and this was dispensed today to help take pressure off the area.  She will try to go to a medical supply store to get one without a prescription she cannot get up. -Prescribed Voltaren gel -I debrided the hyperkeratotic lesions without any complications or bleeding to patient comfort. -Follow-up 6 weeks or sooner if needed.  Call if questions or concerns.  Trula Slade DPM

## 2017-07-12 DIAGNOSIS — H25813 Combined forms of age-related cataract, bilateral: Secondary | ICD-10-CM | POA: Diagnosis not present

## 2017-07-12 DIAGNOSIS — H401131 Primary open-angle glaucoma, bilateral, mild stage: Secondary | ICD-10-CM | POA: Diagnosis not present

## 2017-08-19 ENCOUNTER — Ambulatory Visit: Payer: Medicare Other | Admitting: Podiatry

## 2017-08-20 ENCOUNTER — Ambulatory Visit (INDEPENDENT_AMBULATORY_CARE_PROVIDER_SITE_OTHER): Payer: Medicare Other | Admitting: Podiatry

## 2017-08-20 ENCOUNTER — Encounter: Payer: Self-pay | Admitting: Podiatry

## 2017-08-20 DIAGNOSIS — M21619 Bunion of unspecified foot: Secondary | ICD-10-CM | POA: Diagnosis not present

## 2017-08-22 DIAGNOSIS — M21619 Bunion of unspecified foot: Secondary | ICD-10-CM | POA: Insufficient documentation

## 2017-08-22 NOTE — Progress Notes (Signed)
Subjective: 82 year old female presents the office today for follow-up evaluation of a bunion to her left foot.  She states the injection did not help however change in shoes does help some but overall her bony pain is about the same and she will discuss surgery.  She does have a history of a bunion surgery several years ago in the 1970s.  She says the bunion came back since been painful despite shoe changes, offloading as well as trying an injection. Denies any systemic complaints such as fevers, chills, nausea, vomiting. No acute changes since last appointment, and no other complaints at this time.   Objective: AAO x3, NAD DP/PT pulses palpable bilaterally, CRT less than 3 seconds, decreased pedal hair Moderate HAV is present on the left side there is tenderness palpation gently on the bunion.  No pain or crepitation with MPJ range of motion.  There is no first ray hypomobility present.  There is a hyperkeratotic lesion on the bunion site.  There is no fluctuation or crepitation or any clinical signs of infection noted.  No other area tenderness. No open lesions or pre-ulcerative lesions.  No pain with calf compression, swelling, warmth, erythema  Assessment: Symptomatic bunion left foot  Plan: -All treatment options discussed with the patient including all alternatives, risks, complications.  -I reviewed the x-rays with her and we discussed both conservative as well as surgical treatment options.  At this point I discussed surgical intervention.  I discussed with her [name] bunionectomy with screw, stable fixation.  We discussed the surgery as well as postoperative course.  This is not a guarantee resolution of symptoms and she understands this but she wished to proceed with surgery.  Given her upcoming vacations we will plan on doing this the end of July.  In the meantime she has any questions or concerns or any changes in mental me now I will see her back prior to surgery for consent forms.   Given decreased pedal hair and skin changes I would order an ABI/TBI just to ensure healing although she has palpable pulses. -Patient encouraged to call the office with any questions, concerns, change in symptoms.   Trula Slade DPM

## 2017-08-23 ENCOUNTER — Telehealth: Payer: Self-pay | Admitting: *Deleted

## 2017-08-23 DIAGNOSIS — R238 Other skin changes: Secondary | ICD-10-CM

## 2017-08-23 NOTE — Telephone Encounter (Signed)
Orders faxed to [facility]. 

## 2017-08-23 NOTE — Telephone Encounter (Signed)
-----   Message from Trula Slade, DPM sent at 08/22/2017  8:33 AM EDT ----- Can you please order ABI, TBI?  Only at this time prior to her surgery to ensure healing.Thank you

## 2017-08-24 DIAGNOSIS — Z961 Presence of intraocular lens: Secondary | ICD-10-CM | POA: Insufficient documentation

## 2017-08-24 DIAGNOSIS — H25812 Combined forms of age-related cataract, left eye: Secondary | ICD-10-CM | POA: Diagnosis not present

## 2017-08-30 ENCOUNTER — Other Ambulatory Visit: Payer: Self-pay | Admitting: Podiatry

## 2017-08-30 DIAGNOSIS — R238 Other skin changes: Secondary | ICD-10-CM

## 2017-09-07 DIAGNOSIS — Z961 Presence of intraocular lens: Secondary | ICD-10-CM | POA: Insufficient documentation

## 2017-09-07 DIAGNOSIS — H25811 Combined forms of age-related cataract, right eye: Secondary | ICD-10-CM | POA: Diagnosis not present

## 2017-09-07 DIAGNOSIS — H52221 Regular astigmatism, right eye: Secondary | ICD-10-CM | POA: Diagnosis not present

## 2017-09-23 ENCOUNTER — Ambulatory Visit (HOSPITAL_COMMUNITY): Admission: RE | Admit: 2017-09-23 | Payer: Medicare Other | Source: Ambulatory Visit

## 2017-10-07 ENCOUNTER — Ambulatory Visit (HOSPITAL_COMMUNITY)
Admission: RE | Admit: 2017-10-07 | Discharge: 2017-10-07 | Disposition: A | Discharge status: Home / Self Care | Payer: Medicare Other | Source: Ambulatory Visit | Attending: Cardiology | Admitting: Cardiology

## 2017-10-07 DIAGNOSIS — R238 Other skin changes: Secondary | ICD-10-CM | POA: Diagnosis not present

## 2017-11-04 ENCOUNTER — Telehealth: Payer: Self-pay | Admitting: *Deleted

## 2017-11-04 NOTE — Telephone Encounter (Signed)
Left message informing pt of Dr.Wagoner's review of results and that he would discuss at next appt.

## 2017-11-04 NOTE — Telephone Encounter (Signed)
-----   Message from Trula Slade, DPM sent at 11/04/2017  6:55 AM EDT ----- Please let her know that her circulation test that she had done was normal. It was from a few weeks ago and was going to go over it with her at her appointment but just wanted to go ahead and let her know. Thanks.

## 2017-11-10 ENCOUNTER — Telehealth: Payer: Self-pay | Admitting: *Deleted

## 2017-11-10 NOTE — Telephone Encounter (Signed)
Pt states she is calling concerning her procedure next week.

## 2017-11-11 NOTE — Telephone Encounter (Signed)
I am returning your call.  "I found out an answer to my question.  Someone from the surgery center called me.  Thanks for calling me back."

## 2017-11-16 ENCOUNTER — Ambulatory Visit (INDEPENDENT_AMBULATORY_CARE_PROVIDER_SITE_OTHER): Payer: Medicare Other | Admitting: Podiatry

## 2017-11-16 DIAGNOSIS — M2042 Other hammer toe(s) (acquired), left foot: Secondary | ICD-10-CM

## 2017-11-16 DIAGNOSIS — M21619 Bunion of unspecified foot: Secondary | ICD-10-CM | POA: Diagnosis not present

## 2017-11-16 DIAGNOSIS — M2041 Other hammer toe(s) (acquired), right foot: Secondary | ICD-10-CM | POA: Diagnosis not present

## 2017-11-16 MED ORDER — OXYCODONE-ACETAMINOPHEN 5-325 MG PO TABS: 1.0000 | ORAL_TABLET | ORAL | 0 refills | Status: DC | PRN

## 2017-11-16 MED ORDER — CEPHALEXIN 500 MG PO CAPS: 500.0000 mg | ORAL_CAPSULE | Freq: Three times a day (TID) | ORAL | 0 refills | Status: DC

## 2017-11-16 MED ORDER — PROMETHAZINE HCL 25 MG PO TABS: 25.0000 mg | ORAL_TABLET | Freq: Three times a day (TID) | ORAL | 0 refills | Status: DC | PRN

## 2017-11-16 NOTE — Patient Instructions (Signed)

## 2017-11-17 ENCOUNTER — Encounter: Payer: Self-pay | Admitting: Podiatry

## 2017-11-17 DIAGNOSIS — M2042 Other hammer toe(s) (acquired), left foot: Secondary | ICD-10-CM

## 2017-11-17 DIAGNOSIS — K219 Gastro-esophageal reflux disease without esophagitis: Secondary | ICD-10-CM | POA: Diagnosis not present

## 2017-11-17 DIAGNOSIS — M2041 Other hammer toe(s) (acquired), right foot: Secondary | ICD-10-CM | POA: Insufficient documentation

## 2017-11-17 DIAGNOSIS — M2012 Hallux valgus (acquired), left foot: Secondary | ICD-10-CM | POA: Diagnosis not present

## 2017-11-17 DIAGNOSIS — M25572 Pain in left ankle and joints of left foot: Secondary | ICD-10-CM | POA: Diagnosis not present

## 2017-11-17 DIAGNOSIS — M2011 Hallux valgus (acquired), right foot: Secondary | ICD-10-CM | POA: Diagnosis not present

## 2017-11-17 DIAGNOSIS — M21611 Bunion of right foot: Secondary | ICD-10-CM | POA: Diagnosis not present

## 2017-11-17 NOTE — Progress Notes (Signed)
Subjective: 82 year old female presents today for surgical consultation for bunion on her left foot.  She is scheduled for surgery tomorrow.  She states the pain is still very painful.  She is a history of previous bunion surgery 1980s.  She previously and circulation test were normal.  She is also having questions if she can do that her hammertoe surgery as well the same time as her hammertoes are crossing and she states that the third toe is causing issues.  Denies any recent injury or trauma or any changes in the last saw her. Denies any systemic complaints such as fevers, chills, nausea, vomiting. No acute changes since last appointment, and no other complaints at this time.   Objective: AAO x3, NAD DP/PT pulses palpable bilaterally, CRT less than 3 seconds Moderate HAV is present on the patient's left foot there is tenderness palpation of the prominent bunion deformity on the medial first metatarsal head.  There is tenderness palpation of this area there is no pain or crepitation with MPJ range of motion is no first ray hypermobility present.  There is hallux interphalangeus deformity present.  Hammertoe contractures are present and second toes overlapping the third toe.  No open lesions or pre-ulcerative lesions.  No pain with calf compression, swelling, warmth, erythema  Assessment: Bunion deformity with hammertoe deformity  Plan: -All treatment options discussed with the patient including all alternatives, risks, complications.  -I reviewed the x-rays with her.  Discussed the surgery as well as postoperative course and she wished to proceed with bunion surgery.  We discussed that surgery for the hammertoes as well but unfortunately have to fix the second toe and she will likely need a second metatarsal osteotomy as well as PIPJ arthrodesis of the second third toes.  In discussion she wants to not do the surgery and only proceed with a bunion.  We discussed an [name] bunionectomy with  hardware fixation.  We discussed the surgery as well as postoperative course and she wishes to proceed. The incision placement as well as the postoperative course was discussed with the patient. I discussed risks of the surgery which include, but not limited to, infection, bleeding, pain, swelling, need for further surgery, delayed or nonhealing, painful or ugly scar, numbness or sensation changes, over/under correction, recurrence, transfer lesions, further deformity, hardware failure, DVT/PE, loss of toe/foot. Patient understands these risks and wishes to proceed with surgery. The surgical consent was reviewed with the patient all 3 pages were signed. No promises or guarantees were given to the outcome of the procedure. All questions were answered to the best of my ability. Before the surgery the patient was encouraged to call the office if there is any further questions. The surgery will be performed at the [facility] on an outpatient basis. -Postoperative medications and to her pharmacy. -Patient encouraged to call the office with any questions, concerns, change in symptoms.   Trula Slade DPM

## 2017-11-22 ENCOUNTER — Ambulatory Visit (INDEPENDENT_AMBULATORY_CARE_PROVIDER_SITE_OTHER): Payer: Medicare Other | Admitting: Podiatry

## 2017-11-22 ENCOUNTER — Ambulatory Visit (INDEPENDENT_AMBULATORY_CARE_PROVIDER_SITE_OTHER): Payer: Medicare Other

## 2017-11-22 ENCOUNTER — Telehealth: Payer: Self-pay | Admitting: *Deleted

## 2017-11-22 ENCOUNTER — Encounter: Payer: Self-pay | Admitting: Podiatry

## 2017-11-22 DIAGNOSIS — M79672 Pain in left foot: Secondary | ICD-10-CM

## 2017-11-22 DIAGNOSIS — M21619 Bunion of unspecified foot: Secondary | ICD-10-CM

## 2017-11-22 DIAGNOSIS — M2012 Hallux valgus (acquired), left foot: Secondary | ICD-10-CM | POA: Diagnosis not present

## 2017-11-22 DIAGNOSIS — Z9889 Other specified postprocedural states: Secondary | ICD-10-CM

## 2017-11-22 NOTE — Telephone Encounter (Signed)
Called and spoke with the patient and the patient stated that the pain was not too bad and was icing and elevating, did take a pain pill at 2:30 today and there was not any fever or chills and no nausea and I stated to the patient if she had any questions or concerns to call the office and that we would see the patient next week for her appointment. Joy Cain

## 2017-11-24 NOTE — Progress Notes (Signed)
Subjective: Joy Cain is a 82 y.o. is seen today in office s/p [name] bunionectomy preformed on 11/17/2017. They state their pain is controlled and she is only taking Advil for pain.  She is not taking any pain medication.  She has been wearing the boot the majority the time and not sleeping in it.  She has no recent injury or falls and she has no other concerns. Denies any systemic complaints such as fevers, chills, nausea, vomiting. No calf pain, chest pain, shortness of breath.   Objective: General: No acute distress, AAOx3  DP/PT pulses palpable 2/4, CRT < 3 sec to all digits.  Protective sensation intact. Motor function intact.  LEFT foot: Incision is well coapted without any evidence of dehiscence and suture ends are intact. There is no surrounding erythema, ascending cellulitis, fluctuance, crepitus, malodor, drainage/purulence. There is mild edema around the surgical site. There is mild pain along the surgical site.  Toes in this position she is happy with the position of the toe. No other areas of tenderness to bilateral lower extremities.  No other open lesions or pre-ulcerative lesions.  No pain with calf compression, swelling, warmth, erythema.   Assessment and Plan:  Status post left foot surgery, doing well with no complications   -Treatment options discussed including all alternatives, risks, and complications -X-rays were obtained and reviewed.  No evidence of acute fracture.  Status post [name] bunionectomy with hardware intact without any complicating getting factors. -Antibiotic ointment was applied followed by a bandage.  Keep the dressing clean, dry, intact. -Remain in cam boot at all times. -Ice/elevation -Pain medication as needed. -Monitor for any clinical signs or symptoms of infection and DVT/PE and directed to call the office immediately should any occur or go to the ER. -Follow-up in 10 days for possible suture removal or sooner if any problems arise.  In the meantime, encouraged to call the office with any questions, concerns, change in symptoms.   Celesta Gentile, DPM

## 2017-11-25 ENCOUNTER — Telehealth: Payer: Self-pay | Admitting: *Deleted

## 2017-11-25 NOTE — Telephone Encounter (Signed)
Called and left a message for the patient to see who her PCP was. Joy Cain

## 2017-11-29 ENCOUNTER — Ambulatory Visit (INDEPENDENT_AMBULATORY_CARE_PROVIDER_SITE_OTHER): Payer: Medicare Other | Admitting: Podiatry

## 2017-11-29 ENCOUNTER — Encounter: Payer: Self-pay | Admitting: Podiatry

## 2017-11-29 DIAGNOSIS — Z9889 Other specified postprocedural states: Secondary | ICD-10-CM

## 2017-11-29 DIAGNOSIS — M21619 Bunion of unspecified foot: Secondary | ICD-10-CM

## 2017-11-30 NOTE — Progress Notes (Signed)
Subjective: Joy Cain is a 82 y.o. is seen today in office s/p [name] bunionectomy preformed on 11/17/2017. She states that she is doing well and she is only taking liquid tylenol for the pain. She is wearing the cam boot she is been using a walker to help ambulate.  She denies any recent injury or falls.  She has no other concerns today. Denies any systemic complaints such as fevers, chills, nausea, vomiting. No calf pain, chest pain, shortness of breath.   Objective: General: No acute distress, AAOx3  DP/PT pulses palpable 2/4, CRT < 3 sec to all digits.  Protective sensation intact. Motor function intact.  LEFT foot: Incision is well coapted without any evidence of dehiscence and suture ends are intact.  Incision appears to be healing well.  There is minimal swelling to the area there is no surrounding erythema, drainage or pus or any signs of infection.  Overall she states that she is happy with the position of the toe and the bunion is much better than what it was prior to surgery.  There is no significant discomfort upon palpation to the area today.  No other open lesions or pre-ulcerations. No other open lesions or pre-ulcerative lesions.  No pain with calf compression, swelling, warmth, erythema.   Assessment and Plan:  Status post left foot surgery, doing well with no complications   -Treatment options discussed including all alternatives, risks, and complications -Overall she is doing well.  Incision appears to be healing well there are any signs of infection.  Antibiotic ointment and a bandage was applied.  She can take this off on Thursday and start to shower as long as incision remains healing well.  I also dispensed a Darco bunion splint to help hold the toes and rectus position.  Continue cam boot at all times and discussed ice elevation.  Pain medication as needed she is not taking narcotic pain medicine only oral liquid Tylenol. -Monitor for any clinical signs or symptoms of  infection and DVT/PE and directed to call the office immediately should any occur or go to the ER. -Follow-up in 10 days for possible suture removal or sooner if any problems arise. In the meantime, encouraged to call the office with any questions, concerns, change in symptoms.   Celesta Gentile, DPM

## 2017-12-02 ENCOUNTER — Telehealth: Payer: Self-pay | Admitting: Podiatry

## 2017-12-02 NOTE — Telephone Encounter (Signed)
I had surgery 31 July and I wanted to know if I will be able to drive in 2 weeks from today, 29 August.

## 2017-12-02 NOTE — Telephone Encounter (Signed)
Called patient and stated that she could drive and not to take any pain medicines or drive a stick shift car and we would see patient back in a week and to call the office if any concerns or questions. Lattie Haw

## 2017-12-09 ENCOUNTER — Ambulatory Visit (INDEPENDENT_AMBULATORY_CARE_PROVIDER_SITE_OTHER): Payer: Medicare Other | Admitting: Podiatry

## 2017-12-09 ENCOUNTER — Encounter: Payer: Self-pay | Admitting: Podiatry

## 2017-12-09 ENCOUNTER — Ambulatory Visit (INDEPENDENT_AMBULATORY_CARE_PROVIDER_SITE_OTHER): Payer: Medicare Other

## 2017-12-09 DIAGNOSIS — M21619 Bunion of unspecified foot: Secondary | ICD-10-CM

## 2017-12-09 DIAGNOSIS — M2042 Other hammer toe(s) (acquired), left foot: Secondary | ICD-10-CM | POA: Diagnosis not present

## 2017-12-09 DIAGNOSIS — Z9889 Other specified postprocedural states: Secondary | ICD-10-CM

## 2017-12-09 DIAGNOSIS — M2041 Other hammer toe(s) (acquired), right foot: Secondary | ICD-10-CM

## 2017-12-10 NOTE — Progress Notes (Signed)
Subjective: Joy Cain is a 82 y.o. is seen today in office s/p [name] bunionectomy preformed on 11/17/2017.  She states that overall she is doing well.  She has been using a walker to help take some pressure of the foot she is been wearing the cam boot.  She denies any recent injury or falls and she denies any increase in swelling she states that she has not really had any pain or swelling since the surgery and she is doing well.  She has no other concerns today. Denies any systemic complaints such as fevers, chills, nausea, vomiting. No calf pain, chest pain, shortness of breath.   Objective: General: No acute distress, AAOx3  DP/PT pulses palpable 2/4, CRT < 3 sec to all digits.  Protective sensation intact. Motor function intact.  LEFT foot: Incision is well coapted without any evidence of dehiscence and suture ends are intact.  Incision appears to be healing well and there is no surrounding erythema, ascending cellulitis.  There is no fluctuation or crepitation is no clinical signs of infection noted today.  Minimal swelling to the area there is no significant pain.  The toes in a more rectus position and she states that she is happy with the surgery. No other open lesions or pre-ulcerative lesions.  No pain with calf compression, swelling, warmth, erythema.   Assessment and Plan:  Status post left foot surgery, doing well with no complications   -Treatment options discussed including all alternatives, risks, and complications -X-rays were obtained reviewed hardware intact without any evidence of acute fracture and there is increased consolidation across the osteotomy site. -At this time she can start to shower.  Recommend antibiotic ointment to the incision daily.  Remove the suture knots today.  Continue with compression bandage to help with swelling although she states that she has not noticed much swelling.  Also do recommend ice to the area. -Monitor for any clinical signs or  symptoms of infection and directed to call the office immediately should any occur or go to the ER.  *X-ray next appointment and start to transition to a shoe, possible PT  Trula Slade DPM

## 2017-12-23 ENCOUNTER — Ambulatory Visit (INDEPENDENT_AMBULATORY_CARE_PROVIDER_SITE_OTHER): Payer: Medicare Other | Admitting: Podiatry

## 2017-12-23 ENCOUNTER — Encounter: Payer: Self-pay | Admitting: Podiatry

## 2017-12-23 ENCOUNTER — Ambulatory Visit (INDEPENDENT_AMBULATORY_CARE_PROVIDER_SITE_OTHER): Payer: Medicare Other

## 2017-12-23 DIAGNOSIS — M2012 Hallux valgus (acquired), left foot: Secondary | ICD-10-CM

## 2017-12-27 NOTE — Progress Notes (Signed)
Subjective: Joy Cain is a 82 y.o. is seen today in office s/p Joy Cain bunionectomy preformed on 11/17/2017.  She states that overall she is doing well.  She states that she has some occasional discomfort and this is mostly to the top of her foot that started after she went to the surgical shoe so she went back to wearing the cam boot.  No recent injury or falls. She has no other concerns today. Denies any systemic complaints such as fevers, chills, nausea, vomiting. No calf pain, chest pain, shortness of breath.   Objective: General: No acute distress, AAOx3  DP/PT pulses palpable 2/4, CRT < 3 sec to all digits.  Protective sensation intact. Motor function intact.  LEFT foot: Incision is well coapted without any evidence of dehiscence and a scar is well formed.  There is no significant discomfort to palpation the surgical site.  There is mild restriction of first MPJ range of motion.  There is no erythema or warmth there is no clinical signs of infection.  There is no other areas of tenderness.  She was describing some pain at the dorsal midfoot but this is from the surgical shoe it appears and his pain resolved after transitioning back to the cam boot.  No other open lesions or pre-ulcerative lesions.  No pain with calf compression, swelling, warmth, erythema.   Assessment and Plan:  Status post left foot surgery, doing well with no complications   -Treatment options discussed including all alternatives, risks, and complications -X-rays were obtained reviewed hardware intact without any evidence of acute fracture and there is increased consolidation across the osteotomy site. -Discussed with her range of motion exercises for the first MPJ.  Discussed that if she is improving she can start to transition back into regular, supportive shoe.  Continue ice and elevate as well.  She has not been requiring any pain medication. -Monitor for any clinical signs or symptoms of infection and directed  to call the office immediately should any occur or go to the ER.  Trula Slade DPM

## 2017-12-31 ENCOUNTER — Encounter: Payer: Self-pay | Admitting: Podiatry

## 2017-12-31 ENCOUNTER — Ambulatory Visit (INDEPENDENT_AMBULATORY_CARE_PROVIDER_SITE_OTHER): Payer: Medicare Other | Admitting: Podiatry

## 2017-12-31 ENCOUNTER — Ambulatory Visit (INDEPENDENT_AMBULATORY_CARE_PROVIDER_SITE_OTHER): Payer: Medicare Other

## 2017-12-31 DIAGNOSIS — M2012 Hallux valgus (acquired), left foot: Secondary | ICD-10-CM

## 2017-12-31 DIAGNOSIS — Z9889 Other specified postprocedural states: Secondary | ICD-10-CM

## 2018-01-03 NOTE — Progress Notes (Signed)
Subjective: Joy Cain is a 82 y.o. is seen today in office s/p [name] bunionectomy preformed on 11/17/2017.  Overall she states that she is doing better.  She is wearing the boot and she is into a surgical shoe without any use of any assistive devices.  She states that when she tries to bend her big toe she does get some discomfort but otherwise she is doing well. Denies any systemic complaints such as fevers, chills, nausea, vomiting. No calf pain, chest pain, shortness of breath.   Objective: General: No acute distress, AAOx3  DP/PT pulses palpable 2/4, CRT < 3 sec to all digits.  Protective sensation intact. Motor function intact.  LEFT foot: Incision is well coapted without any evidence of dehiscence and a scar is well formed.  There is no significant tenderness palpation of the surgical site.  There is some discomfort with dorsiflexion of the first MPJ towards the end range of motion.  Decreased swelling to the area there is no erythema or warmth there is no drainage or pus or any clinical signs of infection noted today.  Overall she is happy with the results so far. No other open lesions or pre-ulcerative lesions.  No pain with calf compression, swelling, warmth, erythema.   Assessment and Plan:  Status post left foot surgery, doing well with no complications   -Treatment options discussed including all alternatives, risks, and complications -X-rays were obtained and reviewed.  No evidence of acute fracture.  Hardware intact status post bunionectomy.  Arthritic changes present the first MPJ.  Discussed the findings with her today. -We will need to remain in surgical shoe for now I want her to gradually increase her range of motion exercises.  Discussed with her that as she is able to order to start to transition back into a stiffer soled type tennis shoe.  Continue ice elevate.  She is currently going on vacation when she comes back I will see her and likely start physical therapy next  appointment.  I want her to continue range of motion exercises on her own for now.  Trula Slade DPM

## 2018-01-13 ENCOUNTER — Ambulatory Visit: Payer: Medicare Other | Admitting: Podiatry

## 2018-01-20 ENCOUNTER — Ambulatory Visit (INDEPENDENT_AMBULATORY_CARE_PROVIDER_SITE_OTHER): Payer: Medicare Other | Admitting: Podiatry

## 2018-01-20 ENCOUNTER — Ambulatory Visit (INDEPENDENT_AMBULATORY_CARE_PROVIDER_SITE_OTHER): Payer: Medicare Other

## 2018-01-20 ENCOUNTER — Encounter: Payer: Self-pay | Admitting: Podiatry

## 2018-01-20 DIAGNOSIS — M2012 Hallux valgus (acquired), left foot: Secondary | ICD-10-CM

## 2018-01-20 NOTE — Progress Notes (Signed)
Subjective: Joy Cain is a 82 y.o. is seen today in office s/p [name] bunionectomy preformed on 11/17/2017. She states that is getting better.  She has been working the range of motion exercises at home is getting better.  She still gets some pain on the surgical site and because of that she has not tried to go back into a shoe.  She has not tried she will also the last time she is wearing the surgical shoe.  Denies any recent injury or falls.  Overall she just feels better.  She has no other concerns today.  Denies any systemic complaints such as fevers, chills, nausea, vomiting. No calf pain, chest pain, shortness of breath.   Objective: General: No acute distress, AAOx3  DP/PT pulses palpable 2/4, CRT < 3 sec to all digits.  Protective sensation intact. Motor function intact.  LEFT foot: Incision is well coapted without any evidence of dehiscence and a scar is well formed.  There is improved range of motion of the first MPJ.  Mild tenderness palpation of the dorsal first metatarsal head.  No palpable hardware is identified.  There is no pain with MPJ range of motion there is no crepitation with MPJ range of motion.  Minimal swelling to the foot there is no erythema or warmth there is no clinical signs of infection noted today. No other open lesions or pre-ulcerative lesions.  No pain with calf compression, swelling, warmth, erythema.   Assessment and Plan:  Status post left foot surgery, doing well with no complications   -Treatment options discussed including all alternatives, risks, and complications -X-rays were obtained and reviewed.  No evidence of acute fracture.  Hardware intact status post bunionectomy.  Arthritic changes present the first MPJ.  -Overall she is getting better but she has not tried to go back into a shoe.  On her continue with range of motion exercises at home however, seem to start physical therapy.  [facility] for [facility] was written today.  Continue the compression socks that she has.  Continue ice and elevate.  Return in about 4 weeks (around 02/17/2018).  Trula Slade DPM

## 2018-01-24 DIAGNOSIS — R269 Unspecified abnormalities of gait and mobility: Secondary | ICD-10-CM | POA: Diagnosis not present

## 2018-01-24 DIAGNOSIS — M25572 Pain in left ankle and joints of left foot: Secondary | ICD-10-CM | POA: Diagnosis not present

## 2018-01-24 DIAGNOSIS — M25675 Stiffness of left foot, not elsewhere classified: Secondary | ICD-10-CM | POA: Diagnosis not present

## 2018-01-24 DIAGNOSIS — M25475 Effusion, left foot: Secondary | ICD-10-CM | POA: Diagnosis not present

## 2018-01-25 DIAGNOSIS — I351 Nonrheumatic aortic (valve) insufficiency: Secondary | ICD-10-CM | POA: Diagnosis not present

## 2018-01-27 DIAGNOSIS — M25475 Effusion, left foot: Secondary | ICD-10-CM | POA: Diagnosis not present

## 2018-01-27 DIAGNOSIS — M25675 Stiffness of left foot, not elsewhere classified: Secondary | ICD-10-CM | POA: Diagnosis not present

## 2018-01-27 DIAGNOSIS — R269 Unspecified abnormalities of gait and mobility: Secondary | ICD-10-CM | POA: Diagnosis not present

## 2018-01-27 DIAGNOSIS — M25572 Pain in left ankle and joints of left foot: Secondary | ICD-10-CM | POA: Diagnosis not present

## 2018-02-01 DIAGNOSIS — R269 Unspecified abnormalities of gait and mobility: Secondary | ICD-10-CM | POA: Diagnosis not present

## 2018-02-01 DIAGNOSIS — M25675 Stiffness of left foot, not elsewhere classified: Secondary | ICD-10-CM | POA: Diagnosis not present

## 2018-02-01 DIAGNOSIS — M25475 Effusion, left foot: Secondary | ICD-10-CM | POA: Diagnosis not present

## 2018-02-01 DIAGNOSIS — Z9989 Dependence on other enabling machines and devices: Secondary | ICD-10-CM | POA: Diagnosis not present

## 2018-02-03 DIAGNOSIS — M25675 Stiffness of left foot, not elsewhere classified: Secondary | ICD-10-CM | POA: Diagnosis not present

## 2018-02-03 DIAGNOSIS — Z9989 Dependence on other enabling machines and devices: Secondary | ICD-10-CM | POA: Diagnosis not present

## 2018-02-03 DIAGNOSIS — R269 Unspecified abnormalities of gait and mobility: Secondary | ICD-10-CM | POA: Diagnosis not present

## 2018-02-03 DIAGNOSIS — M25475 Effusion, left foot: Secondary | ICD-10-CM | POA: Diagnosis not present

## 2018-02-07 DIAGNOSIS — R269 Unspecified abnormalities of gait and mobility: Secondary | ICD-10-CM | POA: Diagnosis not present

## 2018-02-07 DIAGNOSIS — M25475 Effusion, left foot: Secondary | ICD-10-CM | POA: Diagnosis not present

## 2018-02-07 DIAGNOSIS — M25675 Stiffness of left foot, not elsewhere classified: Secondary | ICD-10-CM | POA: Diagnosis not present

## 2018-02-07 DIAGNOSIS — Z9989 Dependence on other enabling machines and devices: Secondary | ICD-10-CM | POA: Diagnosis not present

## 2018-02-10 DIAGNOSIS — M25475 Effusion, left foot: Secondary | ICD-10-CM | POA: Diagnosis not present

## 2018-02-10 DIAGNOSIS — M25675 Stiffness of left foot, not elsewhere classified: Secondary | ICD-10-CM | POA: Diagnosis not present

## 2018-02-10 DIAGNOSIS — Z9989 Dependence on other enabling machines and devices: Secondary | ICD-10-CM | POA: Diagnosis not present

## 2018-02-10 DIAGNOSIS — R269 Unspecified abnormalities of gait and mobility: Secondary | ICD-10-CM | POA: Diagnosis not present

## 2018-02-14 DIAGNOSIS — Z9989 Dependence on other enabling machines and devices: Secondary | ICD-10-CM | POA: Diagnosis not present

## 2018-02-14 DIAGNOSIS — M25475 Effusion, left foot: Secondary | ICD-10-CM | POA: Diagnosis not present

## 2018-02-14 DIAGNOSIS — R269 Unspecified abnormalities of gait and mobility: Secondary | ICD-10-CM | POA: Diagnosis not present

## 2018-02-14 DIAGNOSIS — M25675 Stiffness of left foot, not elsewhere classified: Secondary | ICD-10-CM | POA: Diagnosis not present

## 2018-02-17 ENCOUNTER — Ambulatory Visit (INDEPENDENT_AMBULATORY_CARE_PROVIDER_SITE_OTHER): Payer: Medicare Other | Admitting: Podiatry

## 2018-02-17 ENCOUNTER — Ambulatory Visit: Payer: Medicare Other | Admitting: Podiatry

## 2018-02-17 DIAGNOSIS — M25675 Stiffness of left foot, not elsewhere classified: Secondary | ICD-10-CM | POA: Diagnosis not present

## 2018-02-17 DIAGNOSIS — K219 Gastro-esophageal reflux disease without esophagitis: Secondary | ICD-10-CM | POA: Diagnosis not present

## 2018-02-17 DIAGNOSIS — M2012 Hallux valgus (acquired), left foot: Secondary | ICD-10-CM

## 2018-02-17 DIAGNOSIS — Z9889 Other specified postprocedural states: Secondary | ICD-10-CM

## 2018-02-17 DIAGNOSIS — I10 Essential (primary) hypertension: Secondary | ICD-10-CM | POA: Diagnosis not present

## 2018-02-17 DIAGNOSIS — I351 Nonrheumatic aortic (valve) insufficiency: Secondary | ICD-10-CM | POA: Diagnosis not present

## 2018-02-17 DIAGNOSIS — Z0189 Encounter for other specified special examinations: Secondary | ICD-10-CM | POA: Diagnosis not present

## 2018-02-17 DIAGNOSIS — M25475 Effusion, left foot: Secondary | ICD-10-CM | POA: Diagnosis not present

## 2018-02-17 DIAGNOSIS — R269 Unspecified abnormalities of gait and mobility: Secondary | ICD-10-CM | POA: Diagnosis not present

## 2018-02-17 DIAGNOSIS — Z9989 Dependence on other enabling machines and devices: Secondary | ICD-10-CM | POA: Diagnosis not present

## 2018-02-21 NOTE — Progress Notes (Signed)
Subjective: Joy Cain is a 82 y.o. is seen today in office s/p [name] bunionectomy preformed on 11/17/2017.  She states that overall she is doing better she is back to wearing a regular shoe that is loose.  She states that the foot looks good and she is happy with it.  She denies any recent injury or falls or any changes since the last saw her and she has no new concerns.  Minimal swelling to the foot.  No fevers, chills, nausea, vomiting.  No calf pain, chest pain, shortness of breath.    Objective: General: No acute distress, AAOx3  DP/PT pulses palpable 2/4, CRT < 3 sec to all digits.  Protective sensation intact. Motor function intact.  LEFT foot: Incision is well coapted without any evidence of dehiscence and a scar is well formed.  Improve range of motion of the first MTPJ.  There is significant improvement clinically regards to preoperatively.  She is happy with the position of the toe.  Minimal swelling to the area there is no erythema or warmth there is no signs of infection.   No other open lesions or pre-ulcerative lesions.  No pain with calf compression, swelling, warmth, erythema.   Assessment and Plan:  Status post left foot surgery, doing well with no complications   -Treatment options discussed including all alternatives, risks, and complications -Continue physical therapy and continue with home range of motion exercises for the first MPJ.  Gradually increase activity level and wearing supportive shoe.  Continue to ice the area as well as well as a compression anklet to help with swelling.  Return in about 6 weeks (around 03/31/2018). x-ray next appointment   Trula Slade DPM

## 2018-02-23 DIAGNOSIS — M25675 Stiffness of left foot, not elsewhere classified: Secondary | ICD-10-CM | POA: Diagnosis not present

## 2018-02-23 DIAGNOSIS — R269 Unspecified abnormalities of gait and mobility: Secondary | ICD-10-CM | POA: Diagnosis not present

## 2018-02-23 DIAGNOSIS — M25475 Effusion, left foot: Secondary | ICD-10-CM | POA: Diagnosis not present

## 2018-02-23 DIAGNOSIS — Z9989 Dependence on other enabling machines and devices: Secondary | ICD-10-CM | POA: Diagnosis not present

## 2018-02-23 DIAGNOSIS — Z853 Personal history of malignant neoplasm of breast: Secondary | ICD-10-CM | POA: Diagnosis not present

## 2018-02-23 DIAGNOSIS — Z9189 Other specified personal risk factors, not elsewhere classified: Secondary | ICD-10-CM | POA: Diagnosis not present

## 2018-03-01 DIAGNOSIS — M25475 Effusion, left foot: Secondary | ICD-10-CM | POA: Diagnosis not present

## 2018-03-01 DIAGNOSIS — R269 Unspecified abnormalities of gait and mobility: Secondary | ICD-10-CM | POA: Diagnosis not present

## 2018-03-01 DIAGNOSIS — M25675 Stiffness of left foot, not elsewhere classified: Secondary | ICD-10-CM | POA: Diagnosis not present

## 2018-03-01 DIAGNOSIS — Z9989 Dependence on other enabling machines and devices: Secondary | ICD-10-CM | POA: Diagnosis not present

## 2018-03-03 DIAGNOSIS — M25675 Stiffness of left foot, not elsewhere classified: Secondary | ICD-10-CM | POA: Diagnosis not present

## 2018-03-03 DIAGNOSIS — M25475 Effusion, left foot: Secondary | ICD-10-CM | POA: Diagnosis not present

## 2018-03-03 DIAGNOSIS — Z9989 Dependence on other enabling machines and devices: Secondary | ICD-10-CM | POA: Diagnosis not present

## 2018-03-03 DIAGNOSIS — R269 Unspecified abnormalities of gait and mobility: Secondary | ICD-10-CM | POA: Diagnosis not present

## 2018-03-08 DIAGNOSIS — M25675 Stiffness of left foot, not elsewhere classified: Secondary | ICD-10-CM | POA: Diagnosis not present

## 2018-03-08 DIAGNOSIS — M25475 Effusion, left foot: Secondary | ICD-10-CM | POA: Diagnosis not present

## 2018-03-08 DIAGNOSIS — R269 Unspecified abnormalities of gait and mobility: Secondary | ICD-10-CM | POA: Diagnosis not present

## 2018-03-08 DIAGNOSIS — Z9989 Dependence on other enabling machines and devices: Secondary | ICD-10-CM | POA: Diagnosis not present

## 2018-03-21 DIAGNOSIS — H401131 Primary open-angle glaucoma, bilateral, mild stage: Secondary | ICD-10-CM | POA: Diagnosis not present

## 2018-03-23 DIAGNOSIS — K219 Gastro-esophageal reflux disease without esophagitis: Secondary | ICD-10-CM | POA: Diagnosis not present

## 2018-03-23 DIAGNOSIS — I10 Essential (primary) hypertension: Secondary | ICD-10-CM | POA: Diagnosis not present

## 2018-03-23 DIAGNOSIS — I351 Nonrheumatic aortic (valve) insufficiency: Secondary | ICD-10-CM | POA: Diagnosis not present

## 2018-03-23 DIAGNOSIS — Z0189 Encounter for other specified special examinations: Secondary | ICD-10-CM | POA: Diagnosis not present

## 2018-03-29 ENCOUNTER — Ambulatory Visit (INDEPENDENT_AMBULATORY_CARE_PROVIDER_SITE_OTHER): Payer: Medicare Other

## 2018-03-29 ENCOUNTER — Ambulatory Visit (INDEPENDENT_AMBULATORY_CARE_PROVIDER_SITE_OTHER): Payer: Medicare Other | Admitting: Podiatry

## 2018-03-29 DIAGNOSIS — M2012 Hallux valgus (acquired), left foot: Secondary | ICD-10-CM

## 2018-03-29 DIAGNOSIS — Z9889 Other specified postprocedural states: Secondary | ICD-10-CM

## 2018-03-30 NOTE — Progress Notes (Signed)
Subjective: Joy Cain is a 81 y.o. is seen today in office s/p Joy Cain bunionectomy preformed on 11/17/2017.  She states that overall she is doing better and she is gradually improving.  She still gets some numbness on the surgical site but overall she does feel the numbness is improving as well.  She also gets some discomfort at times when she bends her toe quite a bit but otherwise she is improving she is going to regular shoe without any significant discomfort.  No fevers, chills, nausea, vomiting.  No calf pain, chest pain, shortness of breath.    Objective: General: No acute distress, AAOx3  DP/PT pulses palpable 2/4, CRT < 3 sec to all digits.  Protective sensation intact. Motor function intact.  LEFT foot: Incision is well coapted without any evidence of dehiscence and a scar is well formed.  Mild subjective discomfort with end range of motion in dorsiflexion mostly on the medial first MPJ.  There is no area of pinpoint tenderness otherwise there is no discomfort on evaluation today.  Mild swelling to the area there is no erythema or warmth.  The incision of the prior surgery is well-healed. No other open lesions or pre-ulcerative lesions.  No pain with calf compression, swelling, warmth, erythema.   Assessment and Plan:  Status post left foot surgery, doing well with no complications   -Treatment options discussed including all alternatives, risks, and complications -X-rays were obtained reviewed.  Osteotomy site appears to be healed.  Hardware intact.  Mild arthritic changes present the first MPJ which reviewed today. -This time she is wearing a regular shoe without any significant discomfort.  Gradually increase activity level and continue with supportive shoe.  Continue range of motion exercises at home as well.  At this moment discharge in the postoperative care and she agrees with this plan.  She has no further questions or concerns today.  I encouraged her to call any  changes.  Joy Cain DPM

## 2018-04-27 ENCOUNTER — Other Ambulatory Visit: Payer: Self-pay | Admitting: Obstetrics and Gynecology

## 2018-04-27 DIAGNOSIS — Z1231 Encounter for screening mammogram for malignant neoplasm of breast: Secondary | ICD-10-CM

## 2018-05-12 ENCOUNTER — Encounter: Payer: Self-pay | Admitting: Nurse Practitioner

## 2018-05-12 ENCOUNTER — Ambulatory Visit (INDEPENDENT_AMBULATORY_CARE_PROVIDER_SITE_OTHER): Payer: Medicare Other | Admitting: Nurse Practitioner

## 2018-05-12 DIAGNOSIS — Z8719 Personal history of other diseases of the digestive system: Secondary | ICD-10-CM

## 2018-05-12 DIAGNOSIS — R1033 Periumbilical pain: Secondary | ICD-10-CM | POA: Diagnosis not present

## 2018-05-12 DIAGNOSIS — Z853 Personal history of malignant neoplasm of breast: Secondary | ICD-10-CM | POA: Diagnosis not present

## 2018-05-12 DIAGNOSIS — R51 Headache: Secondary | ICD-10-CM | POA: Diagnosis not present

## 2018-05-12 DIAGNOSIS — R519 Headache, unspecified: Secondary | ICD-10-CM

## 2018-05-12 NOTE — Progress Notes (Signed)
Subjective:     Patient ID: Joy Cain , female    DOB: [birth year] , 83 y.o.   MRN: [id number]   Chief Complaint  Patient presents with  . Establish Care  . Abdominal Pain    patient states she has a constant ache and she is not eating as much because she does not want to feel the pain. pt stated she had the same problem 4-5 years and was told she had polyps in her stomach.  . Headache    patient states when she lays down the back of her head hurts    HPI  Here to establish care - she was going to [facility] for her PCP, last seen 6 months ago.  Dr. Asa Lente referred her.  Lives in [place] married for 74 years.  3 children - younger son with blood clot (blood dyscrasia) her grandson has the same problem.  Daughter in wheelchair and her second cousin.  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  She lived in [place] until 2006.    PMH - 1986 had mastectomy left from breast cancer no treatment. Next scheduled mammogram Feb 7th.  Osteoarthritis.    [facility] - Father - deceased from stroke, mother - heart disease and low blood pressure.  5 sisters and 6 brothers [place] farm.  Brother deceased from prostate cancer, Brother - cancer.  Sister passed from Breast cancer. Another sister had breast cancer.  Sister with Parkinson's Dz.   Continues to go there for her opthamologist had cataract removal which she can see much better.   Abdominal Pain  This is a chronic (Previous problem in 2014 - had polyps) problem. The current episode started more than 1 year ago. The onset quality is gradual. The problem occurs constantly. The problem has been gradually worsening. The pain is located in the periumbilical region and suprapubic region. The pain is at a severity of 4/10 (when she eats get worse). The quality of the pain is dull. Associated symptoms include headaches and weight loss (4 lbs in the last 6 months). Pertinent negatives include no constipation or nausea. The  pain is aggravated by eating. She has tried nothing (pepto bismol) for the symptoms. There is no history of irritable bowel syndrome.  Headache   This is a chronic (Has been evaluated at [facility] no Neurology) problem. The current episode started more than 1 year ago. The problem occurs constantly. The problem has been gradually worsening. The pain is located in the left unilateral region. The pain does not radiate. The pain quality is similar to prior headaches. Quality: pressure. The pain is at a severity of 3/10. Associated symptoms include abdominal pain and weight loss (4 lbs in the last 6 months). Pertinent negatives include no nausea. Exacerbated by: lying down makes worse. She has tried nothing for the symptoms. Her past medical history is significant for cancer (breast cancer).     Past Medical History:  Diagnosis Date  . Asthma   . Breast cancer ([place])   . Dysphagia   . GERD (gastroesophageal reflux disease)   . Osteoporosis      Family History  Problem Relation Age of Onset  . Heart disease Mother        d.95  . Stroke Father        d.60  . Breast cancer Sister 58       d.70s from breast cancer  . Prostate cancer Brother  d.40s from prostate cancer  . Cancer Maternal Uncle        d.80s from unspecified form of cancer  . Breast cancer Sister 77  . Cancer Brother        d.80s from unspecified form of cancer  . Cancer Other        d.38s from unspecified form of cancer. This is the daughter of an unaffected brother.     Current Outpatient Medications:  .  Calcium Carb-Cholecalciferol (CALCIUM 500+D PO), Take 2 tablets by mouth daily., Disp: , Rfl:  .  Cholecalciferol (VITAMIN D) 2000 units CAPS, Take 2 tablets by mouth daily. , Disp: , Rfl:  .  vitamin B-12 (CYANOCOBALAMIN) 1000 MCG tablet, Take 1,000 mcg by mouth daily., Disp: , Rfl:    Allergies  Allergen Reactions  . Aspirin Other (See Comments)    GI TRACT UPSET     Review of Systems  Constitutional: Positive  for weight loss (4 lbs in the last 6 months).  Gastrointestinal: Positive for abdominal pain. Negative for constipation and nausea.  Neurological: Positive for headaches.     Today's Vitals   05/12/18 1134  BP: (!) 148/62  Pulse: 64  Temp: 98.1 F (36.7 C)  TempSrc: Oral  SpO2: 97%  Weight: 147 lb 6.4 oz (66.9 kg)  Height: 4\' 3"  (1.295 m)  PainSc: 0-No pain   Body mass index is 39.84 kg/m.   Objective:  Physical Exam Vitals signs reviewed.  Constitutional:      Appearance: She is well-developed.  Cardiovascular:     Rate and Rhythm: Normal rate and regular rhythm.     Heart sounds: Normal heart sounds. No murmur.  Pulmonary:     Effort: Pulmonary effort is normal. No respiratory distress.     Breath sounds: Normal breath sounds. No wheezing.  Abdominal:     General: Bowel sounds are normal.     Palpations: Abdomen is soft.     Tenderness: There is generalized abdominal tenderness and tenderness in the periumbilical area.  Neurological:     Mental Status: She is alert.     Coordination: Romberg sign positive (mild).         Assessment And Plan:     1. Periumbilical abdominal pain  Reports at least a year long history of abdominal discomfort   Persistent abdominal pain concerning for duodenal ulcer vs diverticulosis  Will check CT scan of abdomen  If negative will refer to GI for further evaluation - CT ABDOMEN PELVIS W WO CONTRAST; Future - CMP14 + Anion Gap - H Pylori, IGM, IGG, IGA AB - CBC no Diff  2. Chronic nonintractable headache, unspecified headache type  Persistent intermittent headaches  Mildly positive romberg  Will check CT scan of head to evaluate for bleeding or  structural damage  - CT Head Wo Contrast; Future - CMP14 + Anion Gap - H Pylori, IGM, IGG, IGA AB       Minette Brine, FNP

## 2018-05-16 LAB — CMP14 + ANION GAP
ALBUMIN: 4 g/dL (ref 3.7–4.7)
ALT: 9 IU/L (ref 0–32)
AST: 17 IU/L (ref 0–40)
Albumin/Globulin Ratio: 1.1 — ABNORMAL LOW (ref 1.2–2.2)
Alkaline Phosphatase: 63 IU/L (ref 39–117)
Anion Gap: 13 mmol/L (ref 10.0–18.0)
BILIRUBIN TOTAL: 0.3 mg/dL (ref 0.0–1.2)
BUN / CREAT RATIO: 9 — AB (ref 12–28)
BUN: 10 mg/dL (ref 8–27)
CHLORIDE: 105 mmol/L (ref 96–106)
CO2: 26 mmol/L (ref 20–29)
Calcium: 9.6 mg/dL (ref 8.7–10.3)
Creatinine, Ser: 1.12 mg/dL — ABNORMAL HIGH (ref 0.57–1.00)
GFR calc non Af Amer: 47 mL/min/{1.73_m2} — ABNORMAL LOW (ref >59)
GFR, EST AFRICAN AMERICAN: 54 mL/min/{1.73_m2} — AB (ref >59)
Globulin, Total: 3.8 g/dL (ref 1.5–4.5)
Glucose: 79 mg/dL (ref 65–99)
Potassium: 4.9 mmol/L (ref 3.5–5.2)
Sodium: 144 mmol/L (ref 134–144)
TOTAL PROTEIN: 7.8 g/dL (ref 6.0–8.5)

## 2018-05-16 LAB — H PYLORI, IGM, IGG, IGA AB
H pylori, IgM Abs: <9 units (ref 0.0–8.9)
H. pylori, IgA Abs: <9 units (ref 0.0–8.9)
H. pylori, IgG AbS: 0.53 Index Value (ref 0.00–0.79)

## 2018-05-16 LAB — CBC
HEMATOCRIT: 38.9 % (ref 34.0–46.6)
HEMOGLOBIN: 13.2 g/dL (ref 11.1–15.9)
MCH: 28.4 pg (ref 26.6–33.0)
MCHC: 33.9 g/dL (ref 31.5–35.7)
MCV: 84 fL (ref 79–97)
Platelets: 192 10*3/uL (ref 150–450)
RBC: 4.64 x10E6/uL (ref 3.77–5.28)
RDW: 13 % (ref 11.7–15.4)
WBC: 4.8 10*3/uL (ref 3.4–10.8)

## 2018-05-22 ENCOUNTER — Encounter: Payer: Self-pay | Admitting: Nurse Practitioner

## 2018-05-26 ENCOUNTER — Ambulatory Visit: Payer: Self-pay | Admitting: Cardiology

## 2018-05-27 ENCOUNTER — Ambulatory Visit: Payer: Medicare Other

## 2018-05-27 ENCOUNTER — Other Ambulatory Visit: Payer: Medicare Other

## 2018-06-01 ENCOUNTER — Ambulatory Visit: Payer: Medicare Other | Admitting: Cardiology

## 2018-06-03 ENCOUNTER — Ambulatory Visit
Admission: RE | Admit: 2018-06-03 | Discharge: 2018-06-03 | Disposition: A | Discharge status: Home / Self Care | Payer: Medicare Other | Source: Ambulatory Visit | Attending: Nurse Practitioner | Admitting: Nurse Practitioner

## 2018-06-03 ENCOUNTER — Ambulatory Visit: Payer: Self-pay | Admitting: Cardiology

## 2018-06-03 DIAGNOSIS — R519 Headache, unspecified: Secondary | ICD-10-CM

## 2018-06-03 DIAGNOSIS — R1033 Periumbilical pain: Secondary | ICD-10-CM

## 2018-06-03 DIAGNOSIS — R51 Headache: Principal | ICD-10-CM

## 2018-06-03 DIAGNOSIS — K449 Diaphragmatic hernia without obstruction or gangrene: Secondary | ICD-10-CM | POA: Diagnosis not present

## 2018-06-03 MED ORDER — IOPAMIDOL (ISOVUE-300) INJECTION 61%
100.0000 mL | Freq: Once | INTRAVENOUS | Status: AC | PRN
  Administered 2018-06-03: 100 mL via INTRAVENOUS

## 2018-06-07 DIAGNOSIS — I10 Essential (primary) hypertension: Secondary | ICD-10-CM | POA: Insufficient documentation

## 2018-06-07 DIAGNOSIS — R1311 Dysphagia, oral phase: Secondary | ICD-10-CM | POA: Insufficient documentation

## 2018-06-07 DIAGNOSIS — I351 Nonrheumatic aortic (valve) insufficiency: Secondary | ICD-10-CM | POA: Insufficient documentation

## 2018-06-07 DIAGNOSIS — R0789 Other chest pain: Secondary | ICD-10-CM | POA: Insufficient documentation

## 2018-06-07 NOTE — Progress Notes (Signed)
Patient is here for follow up visit.  Subjective:   @Patient  ID: Joy Cain, female    DOB: [birth year], 83 y.o.   MRN: [id number]  Chief Complaint  Patient presents with  . Hypertension    1 month F/U    HPI Joy Cain is an 83 year old African-American female with a medical history significant for uncontrolled hypertension, moderate aortic regurgitation, GERD, prior negative stress test 2018 who presents for a 4 week follow-up for hypertension.  At last visit, her blood pressure was uncontrolled. She was switched from Nifedipine to Amlodipine 5mg  last office visit due to increased dizziness. Patient admits that she has not been able to take her amlodipine since last office visit because she lost the medication and insurance would not refill the prescription. She has been checking her blood pressure at home and it typically runs in the 299B systolically. Patient admits to intermittent blurry vision, worsening chest pressure, and dizziness.   Past Medical History:  Diagnosis Date  . Arthritis   . Asthma   . Atypical chest pain   . Breast cancer ([place])   . Dysphagia   . Dysphagia   . GERD (gastroesophageal reflux disease)   . Hypertension   . Moderate aortic regurgitation   . Osteoporosis     Past Surgical History:  Procedure Laterality Date  . BUNIONECTOMY    . CHOLECYSTECTOMY  09/2015  . HAMMER TOE SURGERY    . MASTECTOMY    . MASTECTOMY  1987    Social History   Socioeconomic History  . Marital status: Married    Spouse name: Not on file  . Number of children: 3  . Years of education: Not on file  . Highest education level: Not on file  Occupational History  . Not on file  Social Needs  . Financial resource strain: Not on file  . Food insecurity:    Worry: Not on file    Inability: Not on file  . Transportation needs:    Medical: Not on file    Non-medical: Not on file  Tobacco Use  . Smoking status: Never Smoker  . Smokeless tobacco: Never Used   Substance and Sexual Activity  . Alcohol use: No    Alcohol/week: 0.0 standard drinks  . Drug use: No  . Sexual activity: Not on file  Lifestyle  . Physical activity:    Days per week: Not on file    Minutes per session: Not on file  . Stress: Not on file  Relationships  . Social connections:    Talks on phone: Not on file    Gets together: Not on file    Attends religious service: Not on file    Active member of club or organization: Not on file    Attends meetings of clubs or organizations: Not on file    Relationship status: Not on file  . Intimate partner violence:    Fear of current or ex partner: Not on file    Emotionally abused: Not on file    Physically abused: Not on file    Forced sexual activity: Not on file  Other Topics Concern  . Not on file  Social History Narrative   Lives with husband in a 2 story home.  Has 3 children.    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      Current Outpatient Medications on File Prior to Visit  Medication Sig Dispense Refill  . Calcium Carb-Cholecalciferol (CALCIUM 500+D PO)  Take 2 tablets by mouth daily.    . vitamin B-12 (CYANOCOBALAMIN) 1000 MCG tablet Take 1,000 mcg by mouth daily.    . dorzolamide-timolol (COSOPT) 22.3-6.8 MG/ML ophthalmic solution Apply 1 drop to eye 2 (two) times daily.    . famotidine (PEPCID) 40 MG tablet Take 1 tablet by mouth daily.  2   No current facility-administered medications on file prior to visit.     Cardiovascular studies:  Lexiscan myoview stress test 01/25/2017: 1. Low risk of hemodynamically significant coronary artery disease. Prognostically, this is a low - risk study. 2. Resting NSR, Stress, non diagnostic due to pharmacologic stress. Symptoms included chest pain. 3. Normal SPECT study with normal left ventricular systolic function. Low risk study.  Echocardiogram 01/25/2018: Left ventricle cavity is normal in size. Normal global wall motion. Doppler evidence of grade I  (impaired) diastolic dysfunction, normal LAP. Calculated EF 65%. Left atrial cavity is moderately dilated. Mild aortic valve leaflet calcification. Moderate (Grade II) regurgitation. Mild to moderate mitral regurgitation. Mild tricuspid regurgitation. Estimated pulmonary artery systolic pressure 30 mmHg. Trace pulmonic regurgitation. IVC is dilated with blunted respiratory response. Estimated RA pressure 10-15 mmHg. RA pressure elevated compared to previous study in 2017. Otherwise, no significant change.   Review of Systems  Constitution: Negative for decreased appetite, malaise/fatigue, weight gain and weight loss.  HENT: Negative for congestion.   Eyes: Positive for blurred vision (intermittent). Negative for visual disturbance.  Cardiovascular: Positive for chest pain (worsening chest tightness) and claudication (night cramps x 2-3 weeks). Negative for dyspnea on exertion, leg swelling, palpitations and syncope.  Respiratory: Negative for shortness of breath.   Endocrine: Negative for cold intolerance.  Hematologic/Lymphatic: Does not bruise/bleed easily.  Skin: Negative for itching and rash.  Musculoskeletal: Negative for myalgias.  Gastrointestinal: Negative for abdominal pain, nausea and vomiting.  Genitourinary: Negative for dysuria.  Neurological: Negative for dizziness and weakness.  Psychiatric/Behavioral: The patient is not nervous/anxious.   All other systems reviewed and are negative.      Objective:   Vitals:   06/08/18 1002  BP: (!) 155/73  Pulse: 79  SpO2: 95%     Physical Exam  Constitutional: She is oriented to person, place, and time. She appears well-developed and well-nourished. No distress.  HENT:  Head: Normocephalic and atraumatic.  Eyes: Pupils are equal, round, and reactive to light. Conjunctivae are normal.  Neck: No JVD present.  Cardiovascular: Normal rate, regular rhythm and intact distal pulses.  Murmur (II/VI early diastolic murmur LLSB)  heard. Pulmonary/Chest: Effort normal and breath sounds normal. She has no wheezes. She has no rales.  Abdominal: Soft. Bowel sounds are normal. There is no rebound.  Musculoskeletal:        General: No edema.  Lymphadenopathy:    She has no cervical adenopathy.  Neurological: She is alert and oriented to person, place, and time. No cranial nerve deficit.  Skin: Skin is warm and dry.  Psychiatric: She has a normal mood and affect.  Nursing note and vitals reviewed.       Assessment & Recommendations:   1. Essential hypertension Patient's blood pressure is still uncontrolled today at155/73. She has not been on any hypertension medications since last office visit. Amlodipine 5mg  has been increased to 10mg  to better control blood pressure. Patient will follow-up in 6 weeks to recheck blood pressure. If blood pressure is still uncontrolled, spironolactone will be added to current regimen.  2. Moderate aortic regurgitation Patient admits to increased episodes of chest pressure that has  become more constant over the past few months.   3.  Atypical chest pain Chest pain symptoms do not appear to be exertional angina pectoris.  Chest pain could also be due to myocardial ischemia for moderate aortic regurgitation.  Increasing amlodipine may also help.  Will follow this up on the next office visit.  Recent nuclear stress test negative for myocardial ischemia. She also has history of esophageal stricture and GERD, symptoms may be related to this. We will see her back in 6-8 weeks for follow-up.  Adrian Prows, MD, [facility] 06/10/2018, 10:34 AM [facility] [place] Pager: [phone] Office: [phone] If no answer Cell [phone]

## 2018-06-08 ENCOUNTER — Encounter: Payer: Self-pay | Admitting: Cardiology

## 2018-06-08 ENCOUNTER — Ambulatory Visit (INDEPENDENT_AMBULATORY_CARE_PROVIDER_SITE_OTHER): Payer: Medicare Other | Admitting: Cardiology

## 2018-06-08 VITALS — BP 155/73 | HR 79 | Ht 61.5 in | Wt 147.5 lb

## 2018-06-08 DIAGNOSIS — R0789 Other chest pain: Secondary | ICD-10-CM

## 2018-06-08 DIAGNOSIS — I10 Essential (primary) hypertension: Secondary | ICD-10-CM | POA: Diagnosis not present

## 2018-06-08 DIAGNOSIS — I351 Nonrheumatic aortic (valve) insufficiency: Secondary | ICD-10-CM | POA: Diagnosis not present

## 2018-06-08 DIAGNOSIS — K219 Gastro-esophageal reflux disease without esophagitis: Secondary | ICD-10-CM

## 2018-06-08 MED ORDER — AMLODIPINE BESYLATE 10 MG PO TABS: 10.0000 mg | ORAL_TABLET | Freq: Every day | ORAL | 3 refills | Status: DC

## 2018-06-10 ENCOUNTER — Telehealth: Payer: Self-pay

## 2018-06-10 NOTE — Telephone Encounter (Signed)
3rd attempt

## 2018-06-13 ENCOUNTER — Telehealth: Payer: Self-pay

## 2018-06-13 NOTE — Telephone Encounter (Signed)
Patient returned our call and I have advised her of her lab results and Ct scan. YRL,RMA

## 2018-06-23 ENCOUNTER — Ambulatory Visit
Admission: RE | Admit: 2018-06-23 | Discharge: 2018-06-23 | Disposition: A | Discharge status: Home / Self Care | Payer: Medicare Other | Source: Ambulatory Visit | Attending: Obstetrics and Gynecology | Admitting: Obstetrics and Gynecology

## 2018-06-23 ENCOUNTER — Other Ambulatory Visit: Payer: Self-pay | Admitting: Obstetrics and Gynecology

## 2018-06-23 DIAGNOSIS — Z1231 Encounter for screening mammogram for malignant neoplasm of breast: Secondary | ICD-10-CM | POA: Diagnosis not present

## 2018-06-28 ENCOUNTER — Telehealth: Payer: Self-pay

## 2018-06-28 NOTE — Telephone Encounter (Signed)
Pt called and wanted to know the results of her ct scan of her head that she had done in 2/20. I seen were she had this done. She said that she didn't get the results. Also she said that she was suppose to  have a referral set up for an  Colonoscopy as well . [phone]

## 2018-06-28 NOTE — Telephone Encounter (Signed)
Left message for patient to call back to reschedule appointment on 07/21/2018 due to provider being out of the office.

## 2018-06-29 NOTE — Telephone Encounter (Signed)
Called and l/m for pt call us back.  

## 2018-06-29 NOTE — Telephone Encounter (Signed)
Her CT of head does not show any acute changes.  No cause for the headaches. Is she still having the headaches.  I will send the referral to GI, we attempted to call her and when she did not answer I was not sure if she wanted to go for the referral.

## 2018-07-01 ENCOUNTER — Telehealth: Payer: Self-pay | Admitting: Nurse Practitioner

## 2018-07-01 NOTE — Telephone Encounter (Signed)
Called to schedule Medicare Annual Wellness Visit with the Nurse Health Advisor. No answer.  Left message on home answering machine.  If patient returns call, please note: their last AWV was on 06/18/2015, please schedule AWV with NHA any date AFTER 02/29/2018.  Thank you! For any questions please contact: Janace Hoard at [phone] or Skype [email or web address]

## 2018-07-05 NOTE — Telephone Encounter (Signed)
Called to schedule Medicare Annual Wellness Visit with the Nurse Health Advisor. Left message on Home answering machine to call Lattie Haw at [phone].  If patient returns call, please note: their last AWV was on 06/18/2015, please schedule AWV with NHA any date AFTER 02/29/2018.  Thank you! For any questions please contact: Janace Hoard at [phone] or Skype [email or web address]

## 2018-07-21 ENCOUNTER — Other Ambulatory Visit: Payer: Self-pay

## 2018-07-21 ENCOUNTER — Encounter: Payer: Self-pay | Admitting: Cardiology

## 2018-07-21 ENCOUNTER — Ambulatory Visit (INDEPENDENT_AMBULATORY_CARE_PROVIDER_SITE_OTHER): Payer: Medicare Other | Admitting: Cardiology

## 2018-07-21 VITALS — BP 158/77 | HR 78 | Ht 61.5 in | Wt 142.0 lb

## 2018-07-21 DIAGNOSIS — I351 Nonrheumatic aortic (valve) insufficiency: Secondary | ICD-10-CM | POA: Diagnosis not present

## 2018-07-21 DIAGNOSIS — I10 Essential (primary) hypertension: Secondary | ICD-10-CM

## 2018-07-21 MED ORDER — CHLORTHALIDONE 25 MG PO TABS: 12.5000 mg | ORAL_TABLET | Freq: Every day | ORAL | 3 refills | Status: DC

## 2018-07-21 NOTE — Progress Notes (Signed)
Virtual Visit via Video Note   Subjective:   Joy Cain, female    DOB: 11/20/1935, 83 y.o.   MRN: [id number]   I connected with the patient on 07/21/18 by a video enabled telemedicine application and verified that I am speaking with the correct person using two identifiers.     I discussed the limitations of evaluation and management by telemedicine and the availability of in person appointments. The patient expressed understanding and agreed to proceed.   This visit type was conducted due to national recommendations for restrictions regarding the COVID-19 Pandemic (e.g. social distancing).  This format is felt to be most appropriate for this patient at this time.  All issues noted in this document were discussed and addressed.  No physical exam was performed (except for noted visual exam findings with Tele health visits).  The patient has consented to conduct a Tele health visit and understands insurance will be billed.     Chief complaint:  Hypertension  HPI  83 year old African-American female with a medical history significant for uncontrolled hypertension, moderate aortic regurgitation, GERD, prior negative stress test 04/2018.  At last visit in 05/2000, her amlodipine was increased to 10 mg daily. Blood pressure is still elevated today.     Past Medical History:  Diagnosis Date  . Arthritis   . Asthma   . Atypical chest pain   . Breast cancer ([place])   . Dysphagia   . Dysphagia   . GERD (gastroesophageal reflux disease)   . Hypertension   . Moderate aortic regurgitation   . Osteoporosis      Past Surgical History:  Procedure Laterality Date  . BUNIONECTOMY    . CHOLECYSTECTOMY  09/2015  . HAMMER TOE SURGERY    . MASTECTOMY    . MASTECTOMY Left 1987     Social History   Socioeconomic History  . Marital status: Married    Spouse name: Not on file  . Number of children: 3  . Years of education: Not on file  . Highest education level: Not on file   Occupational History  . Not on file  Social Needs  . Financial resource strain: Not on file  . Food insecurity:    Worry: Not on file    Inability: Not on file  . Transportation needs:    Medical: Not on file    Non-medical: Not on file  Tobacco Use  . Smoking status: Never Smoker  . Smokeless tobacco: Never Used  Substance and Sexual Activity  . Alcohol use: No    Alcohol/week: 0.0 standard drinks  . Drug use: No  . Sexual activity: Not on file  Lifestyle  . Physical activity:    Days per week: Not on file    Minutes per session: Not on file  . Stress: Not on file  Relationships  . Social connections:    Talks on phone: Not on file    Gets together: Not on file    Attends religious service: Not on file    Active member of club or organization: Not on file    Attends meetings of clubs or organizations: Not on file    Relationship status: Not on file  . Intimate partner violence:    Fear of current or ex partner: Not on file    Emotionally abused: Not on file    Physically abused: Not on file    Forced sexual activity: Not on file  Other Topics Concern  . Not on  file  Social History Narrative   Lives with husband in a 2 story home.  Has 3 children.    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       Current Outpatient Medications on File Prior to Visit  Medication Sig Dispense Refill  . amLODipine (NORVASC) 10 MG tablet Take 1 tablet (10 mg total) by mouth daily. 180 tablet 3  . Calcium Carb-Cholecalciferol (CALCIUM 500+D PO) Take 2 tablets by mouth daily.    . dorzolamide-timolol (COSOPT) 22.3-6.8 MG/ML ophthalmic solution Apply 1 drop to eye 2 (two) times daily.    . vitamin B-12 (CYANOCOBALAMIN) 1000 MCG tablet Take 1,000 mcg by mouth daily.    . vitamin C (ASCORBIC ACID) 500 MG tablet Take 500 mg by mouth daily.     No current facility-administered medications on file prior to visit.     Cardiovascular studies:  Lexiscan myoview stress test 01/25/2017:  1. Low risk of hemodynamically significant coronary artery disease. Prognostically, this is a low - risk study. 2. Resting NSR, Stress, non diagnostic due to pharmacologic stress. Symptoms included chest pain. 3. Normal SPECT study with normal left ventricular systolic function. Low risk study.  Echocardiogram 01/25/2018: Left ventricle cavity is normal in size. Normal global wall motion. Doppler evidence of grade I (impaired) diastolic dysfunction, normal LAP. Calculated EF 65%. Left atrial cavity is moderately dilated. Mild aortic valve leaflet calcification. Moderate (Grade II) regurgitation. Mild to moderate mitral regurgitation. Mild tricuspid regurgitation. Estimated pulmonary artery systolic pressure 30 mmHg. Trace pulmonic regurgitation. IVC is dilated with blunted respiratory response. Estimated RA pressure 10-15 mmHg. RA pressure elevated compared to previous study in 2017. Otherwise, no significant change.  Recent labs:  Results for Joy Cain, Joy Cain (MRN [id number]) as of 07/21/2018 21:26  Ref. Range 05/12/2018 15:03  WBC Latest Ref Range: 3.4 - 10.8 x10E3/uL 4.8  RBC Latest Ref Range: 3.77 - 5.28 x10E6/uL 4.64  Hemoglobin Latest Ref Range: 11.1 - 15.9 g/dL 13.2  HCT Latest Ref Range: 34.0 - 46.6 % 38.9  MCV Latest Ref Range: 79 - 97 fL 84  MCH Latest Ref Range: 26.6 - 33.0 pg 28.4  MCHC Latest Ref Range: 31.5 - 35.7 g/dL 33.9  RDW Latest Ref Range: 11.7 - 15.4 % 13.0  Platelets Latest Ref Range: 150 - 450 x10E3/uL 192    Results for Joy Cain, Joy Cain (MRN [id number]) as of 07/21/2018 21:26  Ref. Range 05/12/2018 15:03  Sodium Latest Ref Range: 134 - 144 mmol/L 144  Potassium Latest Ref Range: 3.5 - 5.2 mmol/L 4.9  Chloride Latest Ref Range: 96 - 106 mmol/L 105  CO2 Latest Ref Range: 20 - 29 mmol/L 26  Glucose Latest Ref Range: 65 - 99 mg/dL 79  BUN Latest Ref Range: 8 - 27 mg/dL 10  Creatinine Latest Ref Range: 0.57 - 1.00 mg/dL 1.12 (H)  Calcium Latest Ref Range: 8.7 - 10.3  mg/dL 9.6  Anion gap Latest Ref Range: 10.0 - 18.0 mmol/L 13.0  BUN/Creatinine Ratio Latest Ref Range: 12 - 28  9 (L)  Alkaline Phosphatase Latest Ref Range: 39 - 117 IU/L 63  Albumin Latest Ref Range: 3.7 - 4.7 g/dL 4.0  Albumin/Globulin Ratio Latest Ref Range: 1.2 - 2.2  1.1 (L)  AST Latest Ref Range: 0 - 40 IU/L 17  ALT Latest Ref Range: 0 - 32 IU/L 9  Total Protein Latest Ref Range: 6.0 - 8.5 g/dL 7.8  Total Bilirubin Latest Ref Range: 0.0 - 1.2 mg/dL 0.3  GFR, Est Non African American  Latest Ref Range: >59 mL/min/1.73 47 (L)  GFR, Est African American Latest Ref Range: >59 mL/min/1.73 54 (L)    Review of Systems  Constitution: Negative for decreased appetite, malaise/fatigue, weight gain and weight loss.  HENT: Negative for congestion.   Eyes: Negative for visual disturbance.  Cardiovascular: Negative for chest pain, claudication, dyspnea on exertion, leg swelling, palpitations and syncope.  Respiratory: Negative for shortness of breath.   Endocrine: Negative for cold intolerance.  Hematologic/Lymphatic: Does not bruise/bleed easily.  Skin: Negative for itching and rash.  Musculoskeletal: Negative for myalgias.  Gastrointestinal: Negative for abdominal pain, nausea and vomiting.  Genitourinary: Negative for dysuria.  Neurological: Negative for dizziness and weakness.  Psychiatric/Behavioral: The patient is not nervous/anxious.   All other systems reviewed and are negative.  Physical Exam  Constitutional: She is oriented to person, place, and time. She appears well-developed and well-nourished.  Neck: No JVD present.  Pulmonary/Chest: Effort normal.  Musculoskeletal:        General: No edema.  Neurological: She is alert and oriented to person, place, and time.  Psychiatric: She has a normal mood and affect.  Nursing note and vitals reviewed.         Vitals:   07/21/18 1039  BP: (!) 158/77  Pulse: 78   (Measured by the patient using a home BP monitor)    Observation/findings during video visit   Objective:         Assessment & Recommendations:   83 year old African-American female with a medical history significant for uncontrolled hypertension, moderate aortic regurgitation, GERD, prior negative stress test 04/2018.  1. Essential hypertension Uncontrolled. Added chlorthalidone 12.5 mg daily. This may be increased to 25 mg daily in one week, if BP stays >140 mmHg.   2. Moderate aortic regurgitation Continue medical management.  Follow up virtual visit in 4 weeks.   Nigel Mormon, MD [facility] [place] Pager: [phone] Office: [phone] If no answer Cell [phone]

## 2018-08-09 ENCOUNTER — Other Ambulatory Visit: Payer: Self-pay | Admitting: Cardiology

## 2018-08-09 DIAGNOSIS — I351 Nonrheumatic aortic (valve) insufficiency: Secondary | ICD-10-CM

## 2018-08-18 ENCOUNTER — Encounter: Payer: Self-pay | Admitting: Cardiology

## 2018-08-18 ENCOUNTER — Other Ambulatory Visit: Payer: Self-pay

## 2018-08-18 ENCOUNTER — Ambulatory Visit (INDEPENDENT_AMBULATORY_CARE_PROVIDER_SITE_OTHER): Payer: Medicare Other | Admitting: Cardiology

## 2018-08-18 VITALS — BP 115/64 | HR 80 | Ht 61.5 in | Wt 149.0 lb

## 2018-08-18 DIAGNOSIS — I10 Essential (primary) hypertension: Secondary | ICD-10-CM

## 2018-08-18 DIAGNOSIS — I351 Nonrheumatic aortic (valve) insufficiency: Secondary | ICD-10-CM | POA: Diagnosis not present

## 2018-08-18 MED ORDER — CHLORTHALIDONE 25 MG PO TABS: 25.0000 mg | ORAL_TABLET | Freq: Every day | ORAL | 3 refills | Status: DC

## 2018-08-18 NOTE — Progress Notes (Signed)
Virtual Visit via Video Note   Subjective:   Joy Cain, female    DOB: 1936/02/12, 83 y.o.   MRN: [id number]   I connected with the patient on 08/18/18 by a video enabled telemedicine application and verified that I am speaking with the correct person using two identifiers.     I discussed the limitations of evaluation and management by telemedicine and the availability of in person appointments. The patient expressed understanding and agreed to proceed.   This visit type was conducted due to national recommendations for restrictions regarding the COVID-19 Pandemic (e.g. social distancing).  This format is felt to be most appropriate for this patient at this time.  All issues noted in this document were discussed and addressed.  No physical exam was performed (except for noted visual exam findings with Tele health visits).  The patient has consented to conduct a Tele health visit and understands insurance will be billed.     Chief complaint:  Hypertension  HPI  83 year old African-American female with a medical history significant for uncontrolled hypertension, moderate aortic regurgitation, GERD, prior negative stress test 04/2018.  At last visit on 07/21/2018, I added chlorthalidone 12.5 mg daily, with plan to increase this to 25 mg daily in one week, if BP stays >140 mmHg. This is her 4 wk f/u visit.   Her blood pressure is much better controlled. She has noticed constant retrosternal burning, unrelated to exertion. She has had similar symptoms in the past.    Past Medical History:  Diagnosis Date  . Arthritis   . Asthma   . Atypical chest pain   . Breast cancer ([place])   . Dysphagia   . Dysphagia   . GERD (gastroesophageal reflux disease)   . Hypertension   . Moderate aortic regurgitation   . Osteoporosis      Past Surgical History:  Procedure Laterality Date  . BUNIONECTOMY    . CHOLECYSTECTOMY  09/2015  . HAMMER TOE SURGERY    . MASTECTOMY    . MASTECTOMY Left  1987     Social History   Socioeconomic History  . Marital status: Married    Spouse name: Not on file  . Number of children: 3  . Years of education: Not on file  . Highest education level: Not on file  Occupational History  . Not on file  Social Needs  . Financial resource strain: Not on file  . Food insecurity:    Worry: Not on file    Inability: Not on file  . Transportation needs:    Medical: Not on file    Non-medical: Not on file  Tobacco Use  . Smoking status: Never Smoker  . Smokeless tobacco: Never Used  Substance and Sexual Activity  . Alcohol use: No    Alcohol/week: 0.0 standard drinks  . Drug use: No  . Sexual activity: Not on file  Lifestyle  . Physical activity:    Days per week: Not on file    Minutes per session: Not on file  . Stress: Not on file  Relationships  . Social connections:    Talks on phone: Not on file    Gets together: Not on file    Attends religious service: Not on file    Active member of club or organization: Not on file    Attends meetings of clubs or organizations: Not on file    Relationship status: Not on file  . Intimate partner violence:    Fear  of current or ex partner: Not on file    Emotionally abused: Not on file    Physically abused: Not on file    Forced sexual activity: Not on file  Other Topics Concern  . Not on file  Social History Narrative   Lives with husband in a 2 story home.  Has 3 children.    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       Current Outpatient Medications on File Prior to Visit  Medication Sig Dispense Refill  . amLODipine (NORVASC) 10 MG tablet Take 1 tablet (10 mg total) by mouth daily. 180 tablet 3  . Calcium Carb-Cholecalciferol (CALCIUM 500+D PO) Take 2 tablets by mouth daily.    . chlorthalidone (HYGROTON) 25 MG tablet Take 0.5 tablets (12.5 mg total) by mouth daily. Is systolie blood pressure (top number) stays above 140, then you may increase to 1 tab once daily. 30 tablet  3  . dorzolamide-timolol (COSOPT) 22.3-6.8 MG/ML ophthalmic solution Apply 1 drop to eye 2 (two) times daily.    . vitamin B-12 (CYANOCOBALAMIN) 1000 MCG tablet Take 1,000 mcg by mouth daily.    . vitamin C (ASCORBIC ACID) 500 MG tablet Take 500 mg by mouth daily.     No current facility-administered medications on file prior to visit.     Cardiovascular studies:  Lexiscan myoview stress test 01/25/2017: 1. Low risk of hemodynamically significant coronary artery disease. Prognostically, this is a low - risk study. 2. Resting NSR, Stress, non diagnostic due to pharmacologic stress. Symptoms included chest pain. 3. Normal SPECT study with normal left ventricular systolic function. Low risk study.  Echocardiogram 01/25/2018: Left ventricle cavity is normal in size. Normal global wall motion. Doppler evidence of grade I (impaired) diastolic dysfunction, normal LAP. Calculated EF 65%. Left atrial cavity is moderately dilated. Mild aortic valve leaflet calcification. Moderate (Grade II) regurgitation. Mild to moderate mitral regurgitation. Mild tricuspid regurgitation. Estimated pulmonary artery systolic pressure 30 mmHg. Trace pulmonic regurgitation. IVC is dilated with blunted respiratory response. Estimated RA pressure 10-15 mmHg. RA pressure elevated compared to previous study in 2017. Otherwise, no significant change.  Recent labs:  Results for EMMALYNN, PINKHAM (MRN [id number]) as of 07/21/2018 21:26  Ref. Range 05/12/2018 15:03  WBC Latest Ref Range: 3.4 - 10.8 x10E3/uL 4.8  RBC Latest Ref Range: 3.77 - 5.28 x10E6/uL 4.64  Hemoglobin Latest Ref Range: 11.1 - 15.9 g/dL 13.2  HCT Latest Ref Range: 34.0 - 46.6 % 38.9  MCV Latest Ref Range: 79 - 97 fL 84  MCH Latest Ref Range: 26.6 - 33.0 pg 28.4  MCHC Latest Ref Range: 31.5 - 35.7 g/dL 33.9  RDW Latest Ref Range: 11.7 - 15.4 % 13.0  Platelets Latest Ref Range: 150 - 450 x10E3/uL 192    Results for LYNSIE, MCWATTERS (MRN [id number]) as of  07/21/2018 21:26  Ref. Range 05/12/2018 15:03  Sodium Latest Ref Range: 134 - 144 mmol/L 144  Potassium Latest Ref Range: 3.5 - 5.2 mmol/L 4.9  Chloride Latest Ref Range: 96 - 106 mmol/L 105  CO2 Latest Ref Range: 20 - 29 mmol/L 26  Glucose Latest Ref Range: 65 - 99 mg/dL 79  BUN Latest Ref Range: 8 - 27 mg/dL 10  Creatinine Latest Ref Range: 0.57 - 1.00 mg/dL 1.12 (H)  Calcium Latest Ref Range: 8.7 - 10.3 mg/dL 9.6  Anion gap Latest Ref Range: 10.0 - 18.0 mmol/L 13.0  BUN/Creatinine Ratio Latest Ref Range: 12 - 28  9 (L)  Alkaline Phosphatase  Latest Ref Range: 39 - 117 IU/L 63  Albumin Latest Ref Range: 3.7 - 4.7 g/dL 4.0  Albumin/Globulin Ratio Latest Ref Range: 1.2 - 2.2  1.1 (L)  AST Latest Ref Range: 0 - 40 IU/L 17  ALT Latest Ref Range: 0 - 32 IU/L 9  Total Protein Latest Ref Range: 6.0 - 8.5 g/dL 7.8  Total Bilirubin Latest Ref Range: 0.0 - 1.2 mg/dL 0.3  GFR, Est Non African American Latest Ref Range: >59 mL/min/1.73 47 (L)  GFR, Est African American Latest Ref Range: >59 mL/min/1.73 54 (L)    Review of Systems  Constitution: Negative for decreased appetite, malaise/fatigue, weight gain and weight loss.  HENT: Negative for congestion.   Eyes: Negative for visual disturbance.  Cardiovascular: Negative for chest pain, claudication, dyspnea on exertion, leg swelling, palpitations and syncope.  Respiratory: Negative for shortness of breath.   Endocrine: Negative for cold intolerance.  Hematologic/Lymphatic: Does not bruise/bleed easily.  Skin: Negative for itching and rash.  Musculoskeletal: Negative for myalgias.  Gastrointestinal: Negative for abdominal pain, nausea and vomiting.  Genitourinary: Negative for dysuria.  Neurological: Negative for dizziness and weakness.  Psychiatric/Behavioral: The patient is not nervous/anxious.   All other systems reviewed and are negative.  Physical Exam  Constitutional: She is oriented to person, place, and time. She appears  well-developed and well-nourished.  Neck: No JVD present.  Pulmonary/Chest: Effort normal.  Musculoskeletal:        General: No edema.  Neurological: She is alert and oriented to person, place, and time.  Psychiatric: She has a normal mood and affect.  Nursing note and vitals reviewed.         Vitals:   08/18/18 1112  BP: 115/64  Pulse: 80   (Measured by the patient using a home BP monitor)   Observation/findings during video visit   Objective:         Assessment & Recommendations:   83 year old African-American female with a medical history significant for uncontrolled hypertension, moderate aortic regurgitation, GERD, prior negative stress test 04/2018.  1. Essential hypertension Better controlled. Continue chlorthalidone 25 mg daily, amlodipine 10 mg daily.  2. Retrosternal burning: Suspect related to GERD.   3. Moderate aortic regurgitation Asymptomatic. Echocardiogram in 12/2018.  Follow up in 01/2019  Nigel Mormon, MD [facility] [place] Pager: [phone] Office: [phone] If no answer Cell [phone]

## 2018-10-05 ENCOUNTER — Ambulatory Visit: Payer: Medicare Other

## 2018-10-05 ENCOUNTER — Ambulatory Visit: Payer: Medicare Other | Admitting: Nurse Practitioner

## 2018-10-12 ENCOUNTER — Other Ambulatory Visit: Payer: Self-pay | Admitting: *Deleted

## 2018-10-12 DIAGNOSIS — Z20822 Contact with and (suspected) exposure to covid-19: Secondary | ICD-10-CM

## 2018-10-17 LAB — NOVEL CORONAVIRUS, NAA: SARS-CoV-2, NAA: NOT DETECTED

## 2018-11-10 ENCOUNTER — Telehealth: Payer: Self-pay | Admitting: Nurse Practitioner

## 2018-11-10 NOTE — Telephone Encounter (Signed)
I left a message on both home and mobile numbers asking the patient to call me at [phone] to schedule AWV with Nickeah. VDM (DD)

## 2018-12-01 ENCOUNTER — Ambulatory Visit (INDEPENDENT_AMBULATORY_CARE_PROVIDER_SITE_OTHER): Payer: Medicare Other

## 2018-12-01 ENCOUNTER — Encounter: Payer: Self-pay | Admitting: Nurse Practitioner

## 2018-12-01 ENCOUNTER — Ambulatory Visit (INDEPENDENT_AMBULATORY_CARE_PROVIDER_SITE_OTHER): Payer: Medicare Other | Admitting: Nurse Practitioner

## 2018-12-01 ENCOUNTER — Other Ambulatory Visit: Payer: Self-pay

## 2018-12-01 VITALS — BP 110/60 | HR 60 | Temp 98.2°F | Ht 60.4 in | Wt 140.4 lb

## 2018-12-01 VITALS — BP 110/60 | HR 60 | Temp 98.2°F | Ht 60.4 in | Wt 140.0 lb

## 2018-12-01 DIAGNOSIS — Z0000 Encounter for general adult medical examination without abnormal findings: Secondary | ICD-10-CM

## 2018-12-01 DIAGNOSIS — R51 Headache: Secondary | ICD-10-CM | POA: Diagnosis not present

## 2018-12-01 DIAGNOSIS — R5383 Other fatigue: Secondary | ICD-10-CM | POA: Diagnosis not present

## 2018-12-01 DIAGNOSIS — K5900 Constipation, unspecified: Secondary | ICD-10-CM | POA: Diagnosis not present

## 2018-12-01 DIAGNOSIS — R630 Anorexia: Secondary | ICD-10-CM | POA: Diagnosis not present

## 2018-12-01 DIAGNOSIS — G8929 Other chronic pain: Secondary | ICD-10-CM

## 2018-12-01 DIAGNOSIS — I10 Essential (primary) hypertension: Secondary | ICD-10-CM | POA: Diagnosis not present

## 2018-12-01 DIAGNOSIS — Z23 Encounter for immunization: Secondary | ICD-10-CM | POA: Diagnosis not present

## 2018-12-01 DIAGNOSIS — R634 Abnormal weight loss: Secondary | ICD-10-CM

## 2018-12-01 DIAGNOSIS — R1033 Periumbilical pain: Secondary | ICD-10-CM

## 2018-12-01 DIAGNOSIS — R519 Headache, unspecified: Secondary | ICD-10-CM

## 2018-12-01 MED ORDER — LINACLOTIDE 72 MCG PO CAPS: 72.0000 ug | ORAL_CAPSULE | Freq: Every day | ORAL | 1 refills | Status: DC

## 2018-12-01 MED ORDER — BOOSTRIX 5-2.5-18.5 LF-MCG/0.5 IM SUSP: 0.5000 mL | Freq: Once | INTRAMUSCULAR | 0 refills | Status: AC

## 2018-12-01 NOTE — Progress Notes (Signed)
Subjective:   Joy Cain is a 83 y.o. female who presents for Medicare Annual (Subsequent) preventive examination.  Review of Systems:  n/a Cardiac Risk Factors include: advanced age (>92men, >38 women);hypertension;sedentary lifestyle     Objective:     Vitals: BP 110/60 (BP Location: Left Arm, Patient Position: Sitting, Cuff Size: Normal)   Pulse 60   Temp 98.2 F (36.8 C) (Oral)   Ht 5' 0.4" (1.534 m)   Wt 140 lb 6.4 oz (63.7 kg)   SpO2 92%   BMI 27.06 kg/m   Body mass index is 27.06 kg/m.  Advanced Directives 12/01/2018 04/09/2015 02/21/2015 02/13/2014  Does Patient Have a Medical Advance Directive? No No No No  Would patient like information on creating a medical advance directive? No - Patient declined No - patient declined information - No - patient declined information    Tobacco Social History   Tobacco Use  Smoking Status Never Smoker  Smokeless Tobacco Never Used     Counseling given: Not Answered   Clinical Intake:  Pre-visit preparation completed: Yes  Pain : 0-10 Pain Score: 4  Pain Type: Other (Comment)(been going on for years) Pain Location: Abdomen Pain Orientation: Lower Pain Radiating Towards: none Pain Descriptors / Indicators: Nagging Pain Onset: More than a month ago Pain Frequency: Constant Pain Relieving Factors: nothing  Pain Relieving Factors: nothing  Nutritional Status: BMI 25 -29 Overweight Nutritional Risks: None Diabetes: No  How often do you need to have someone help you when you read instructions, pamphlets, or other written materials from your doctor or pharmacy?: 1 - Never What is the last grade level you completed in school?: PhD  Interpreter Needed?: No  Information entered by :: NAllen LPN  Past Medical History:  Diagnosis Date  . Arthritis   . Asthma   . Atypical chest pain   . Breast cancer ([place])   . Dysphagia   . Dysphagia   . GERD (gastroesophageal reflux disease)   . Hypertension   . Moderate  aortic regurgitation   . Osteoporosis    Past Surgical History:  Procedure Laterality Date  . BUNIONECTOMY    . CHOLECYSTECTOMY  09/2015  . HAMMER TOE SURGERY    . MASTECTOMY    . MASTECTOMY Left 1987   Family History  Problem Relation Age of Onset  . Heart disease Mother        d.95  . Stroke Father        d.60  . Breast cancer Sister 51       d.70s from breast cancer  . Prostate cancer Brother        d.40s from prostate cancer  . Cancer Maternal Uncle        d.80s from unspecified form of cancer  . Breast cancer Sister 80  . Cancer Brother        d.80s from unspecified form of cancer  . Cancer Other        d.38s from unspecified form of cancer. This is the daughter of an unaffected brother.   Social History   Socioeconomic History  . Marital status: Married    Spouse name: Not on file  . Number of children: 3  . Years of education: Not on file  . Highest education level: Not on file  Occupational History  . Occupation: retired  Scientific laboratory technician  . Financial resource strain: Not hard at all  . Food insecurity    Worry: Never true    Inability: Never true  .  Transportation needs    Medical: No    Non-medical: No  Tobacco Use  . Smoking status: Never Smoker  . Smokeless tobacco: Never Used  Substance and Sexual Activity  . Alcohol use: No    Alcohol/week: 0.0 standard drinks  . Drug use: No  . Sexual activity: Yes  Lifestyle  . Physical activity    Days per week: 0 days    Minutes per session: 0 min  . Stress: Not at all  Relationships  . Social Herbalist on phone: Not on file    Gets together: Not on file    Attends religious service: Not on file    Active member of club or organization: Not on file    Attends meetings of clubs or organizations: Not on file    Relationship status: Not on file  Other Topics Concern  . Not on file  Social History Narrative   Lives with husband in a 2 story home.  Has 3 children.    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      Outpatient Encounter Medications as of 12/01/2018  Medication Sig  . Calcium Carb-Cholecalciferol (CALCIUM 500+D PO) Take 2 tablets by mouth daily.  . cholecalciferol (VITAMIN D3) 25 MCG (1000 UT) tablet Take 1,000 Units by mouth daily.  . dorzolamide-timolol (COSOPT) 22.3-6.8 MG/ML ophthalmic solution Apply to eye.  . TURMERIC PO Take by mouth.  . vitamin B-12 (CYANOCOBALAMIN) 1000 MCG tablet Take 1,000 mcg by mouth daily.  . vitamin C (ASCORBIC ACID) 500 MG tablet Take 500 mg by mouth daily.  Marland Kitchen amLODipine (NORVASC) 10 MG tablet Take 1 tablet (10 mg total) by mouth daily.  . chlorthalidone (HYGROTON) 25 MG tablet Take 1 tablet (25 mg total) by mouth daily. Is systolie blood pressure (top number) stays above 140, then you may increase to 1 tab once daily.  . Tdap (BOOSTRIX) 5-2.5-18.5 LF-MCG/0.5 injection Inject 0.5 mLs into the muscle once for 1 dose.   No facility-administered encounter medications on file as of 12/01/2018.     Activities of Daily Living In your present state of health, do you have any difficulty performing the following activities: 12/01/2018  Hearing? N  Vision? N  Difficulty concentrating or making decisions? N  Walking or climbing stairs? N  Dressing or bathing? N  Doing errands, shopping? N  Preparing Food and eating ? N  Using the Toilet? N  In the past six months, have you accidently leaked urine? N  Do you have problems with loss of bowel control? N  Managing your Medications? N  Managing your Finances? N  Housekeeping or managing your Housekeeping? N  Some recent data might be hidden    Patient Care Team: Minette Brine, FNP as PCP - General (General Practice)    Assessment:   This is a routine wellness examination for Joy Cain.  Exercise Activities and Dietary recommendations Current Exercise Habits: The patient does not participate in regular exercise at present  Goals    . Weight (lb) < 200 lb (90.7 kg)      12/01/2018, wants to get to 135 pounds       Fall Risk Fall Risk  12/01/2018 05/12/2018 12/04/2015 05/01/2015 02/13/2015  Falls in the past year? 0 0 No No No  Risk for fall due to : Medication side effect - - - -  Follow up Education provided;Falls prevention discussed;Falls evaluation completed - - - -   Is the patient's home free of loose  throw rugs in walkways, pet beds, electrical cords, etc?   yes      Grab bars in the bathroom? no      Handrails on the stairs?   yes      Adequate lighting?   yes  Timed Get Up and Go performed: n/a  Depression Screen PHQ 2/9 Scores 12/01/2018 05/12/2018  PHQ - 2 Score 0 0  PHQ- 9 Score 3 -     Cognitive Function     6CIT Screen 12/01/2018  What Year? 0 points  What month? 0 points  What time? 0 points  Count back from 20 0 points  Months in reverse 0 points  Repeat phrase 0 points  Total Score 0     There is no immunization history on file for this patient.  Qualifies for Shingles Vaccine? yes  Screening Tests Health Maintenance  Topic Date Due  . TETANUS/TDAP  11/09/1956  . PNA vac Low Risk Adult (2 of 2 - PPSV23) 06/17/2016  . INFLUENZA VACCINE  11/19/2018  . DEXA SCAN  Completed    Cancer Screenings: Lung: Low Dose CT Chest recommended if Age 75-80 years, 30 pack-year currently smoking OR have quit w/in 15years. Patient does not qualify. Breast:  Up to date on Mammogram? Yes   Up to date of Bone Density/Dexa? Yes Colorectal: up to date  Additional Screenings: : Hepatitis C Screening: n/a     Plan:    Patient wants to get to 135 pounds.   I have personally reviewed and noted the following in the patient's chart:   . Medical and social history . Use of alcohol, tobacco or illicit drugs  . Current medications and supplements . Functional ability and status . Nutritional status . Physical activity . Advanced directives . List of other physicians . Hospitalizations, surgeries, and ER visits in previous 12 months  . Vitals . Screenings to include cognitive, depression, and falls . Referrals and appointments  In addition, I have reviewed and discussed with patient certain preventive protocols, quality metrics, and best practice recommendations. A written personalized care plan for preventive services as well as general preventive health recommendations were provided to patient.     Kellie Simmering, LPN  [id number]/67/[id number]

## 2018-12-01 NOTE — Progress Notes (Signed)
Subjective:     Patient ID: Joy Cain , female    DOB: 17-Mar-1936 , 83 y.o.   MRN: [id number]   Chief Complaint  Patient presents with  . Hypertension    HPI  Wt Readings from Last 3 Encounters: 12/01/18 : 140 lb (63.5 kg) 12/01/18 : 140 lb 6.4 oz (63.7 kg) 08/18/18 : 149 lb (67.6 kg)   Hypertension This is a chronic problem. The current episode started more than 1 year ago. The problem is unchanged. The problem is controlled. Associated symptoms include headaches. Pertinent negatives include no anxiety or malaise/fatigue. There are no associated agents to hypertension. Risk factors for coronary artery disease include sedentary lifestyle.  Abdominal Pain The pain is located in the periumbilical region. The quality of the pain is aching. Associated symptoms include constipation (takes laxative once a day) and headaches. Pertinent negatives include no fever or nausea. Nothing aggravates the pain. The pain is relieved by nothing. Treatments tried: laxative. The treatment provided no relief. There is no history of abdominal surgery or GERD.  Headache  This is a chronic problem. The current episode started more than 1 year ago. The problem occurs intermittently. The pain is located in the parietal region. The pain does not radiate. The quality of the pain is described as aching. Associated symptoms include abdominal pain. Pertinent negatives include no fever or nausea. Her past medical history is significant for hypertension.     Past Medical History:  Diagnosis Date  . Arthritis   . Asthma   . Atypical chest pain   . Breast cancer ([place])   . Dysphagia   . Dysphagia   . GERD (gastroesophageal reflux disease)   . Hypertension   . Moderate aortic regurgitation   . Osteoporosis      Family History  Problem Relation Age of Onset  . Heart disease Mother        d.95  . Stroke Father        d.60  . Breast cancer Sister 88       d.70s from breast cancer  . Prostate cancer Brother         d.40s from prostate cancer  . Cancer Maternal Uncle        d.80s from unspecified form of cancer  . Breast cancer Sister 92  . Cancer Brother        d.80s from unspecified form of cancer  . Cancer Other        d.38s from unspecified form of cancer. This is the daughter of an unaffected brother.     Current Outpatient Medications:  .  amLODipine (NORVASC) 10 MG tablet, Take 1 tablet (10 mg total) by mouth daily., Disp: 180 tablet, Rfl: 3 .  Calcium Carb-Cholecalciferol (CALCIUM 500+D PO), Take 2 tablets by mouth daily., Disp: , Rfl:  .  chlorthalidone (HYGROTON) 25 MG tablet, Take 1 tablet (25 mg total) by mouth daily. Is systolie blood pressure (top number) stays above 140, then you may increase to 1 tab once daily., Disp: 90 tablet, Rfl: 3 .  cholecalciferol (VITAMIN D3) 25 MCG (1000 UT) tablet, Take 1,000 Units by mouth daily., Disp: , Rfl:  .  dorzolamide-timolol (COSOPT) 22.3-6.8 MG/ML ophthalmic solution, Apply to eye., Disp: , Rfl:  .  Tdap (BOOSTRIX) 5-2.5-18.5 LF-MCG/0.5 injection, Inject 0.5 mLs into the muscle once for 1 dose., Disp: 0.5 mL, Rfl: 0 .  TURMERIC PO, Take by mouth., Disp: , Rfl:  .  vitamin B-12 (CYANOCOBALAMIN) 1000 MCG tablet, Take  1,000 mcg by mouth daily., Disp: , Rfl:  .  vitamin C (ASCORBIC ACID) 500 MG tablet, Take 500 mg by mouth daily., Disp: , Rfl:    Allergies  Allergen Reactions  . Aspirin Other (See Comments)    GI TRACT UPSET     Review of Systems  Constitutional: Positive for unexpected weight change. Negative for fatigue, fever and malaise/fatigue.  Cardiovascular: Negative.   Gastrointestinal: Positive for abdominal pain and constipation (takes laxative once a day). Negative for nausea.  Endocrine: Negative for polydipsia, polyphagia and polyuria.  Neurological: Positive for headaches.  Hematological: Negative.      Today's Vitals   12/01/18 1112  BP: 110/60  Pulse: 60  Temp: 98.2 F (36.8 C)  TempSrc: Oral  Weight: 140 lb  (63.5 kg)  Height: 5' 0.4" (1.534 m)  PainSc: 4    Body mass index is 26.98 kg/m.   Objective:  Physical Exam Vitals signs reviewed.  Constitutional:      Appearance: She is well-developed.  Cardiovascular:     Rate and Rhythm: Normal rate and regular rhythm.     Heart sounds: Normal heart sounds. No murmur.  Pulmonary:     Effort: Pulmonary effort is normal. No respiratory distress.     Breath sounds: Normal breath sounds. No wheezing.  Abdominal:     General: Bowel sounds are normal.     Palpations: Abdomen is soft.     Tenderness: There is generalized abdominal tenderness and tenderness in the periumbilical area.  Neurological:     Mental Status: She is alert.     Coordination: Romberg sign positive (mild).         Assessment And Plan:     1. Periumbilical abdominal pain  Persistent intermittent pain  She has had a CT scan done in January revealed diverticulosis no active diverticulitis   Will refer to GI - Ambulatory referral to Gastroenterology  2. Constipation, unspecified constipation type  Will try her on linzess since the miralax I not effective - Ambulatory referral to Gastroenterology  3. Chronic nonintractable headache, unspecified headache type  Left side headache near temporal area concerning for temporal arteritis due to age and no history of headache  Will treat with steroid and check ESR - Sed Rate (ESR)  4. Essential hypertension  Chronic, fair control  Continue current medications  Will check GFR for kidney function - BMP8+eGFR  5. Decreased appetite  She has had a 9 lb weight loss since January  Metabolic cause vs constipation vs other etiology of unknown  - TSH  6. Abnormal weight loss  9 lb weight loss since last visit  Will refer to GI for further evaluation  7. Fatigue, unspecified type  Will check for metabolic cause - TSH    Minette Brine, FNP    THE PATIENT IS ENCOURAGED TO PRACTICE SOCIAL DISTANCING DUE TO  THE COVID-19 PANDEMIC.

## 2018-12-01 NOTE — Patient Instructions (Signed)
Joy Cain , Thank you for taking time to come for your Medicare Wellness Visit. I appreciate your ongoing commitment to your health goals. Please review the following plan we discussed and let me know if I can assist you in the future.   Screening recommendations/referrals: Colonoscopy: not required Mammogram: 06/2018 Bone Density: 06/2017 Recommended yearly ophthalmology/optometry visit for glaucoma screening and checkup Recommended yearly dental visit for hygiene and checkup  Vaccinations: Influenza vaccine: 04/2017 Pneumococcal vaccine: 05/2015 Tdap vaccine: sent to pharmacy Shingles vaccine: discussed    Advanced directives: Advance directive discussed with you today. Even though you declined this today please call our office should you change your mind and we can give you the proper paperwork for you to fill out.   Conditions/risks identified: overweight  Next appointment: 12/01/2018   Preventive Care 81 Years and Older, Female Preventive care refers to lifestyle choices and visits with your health care provider that can promote health and wellness. What does preventive care include?  A yearly physical exam. This is also called an annual well check.  Dental exams once or twice a year.  Routine eye exams. Ask your health care provider how often you should have your eyes checked.  Personal lifestyle choices, including:  Daily care of your teeth and gums.  Regular physical activity.  Eating a healthy diet.  Avoiding tobacco and drug use.  Limiting alcohol use.  Practicing safe sex.  Taking low-dose aspirin every day.  Taking vitamin and mineral supplements as recommended by your health care provider. What happens during an annual well check? The services and screenings done by your health care provider during your annual well check will depend on your age, overall health, lifestyle risk factors, and family history of disease. Counseling  Your health care provider  may ask you questions about your:  Alcohol use.  Tobacco use.  Drug use.  Emotional well-being.  Home and relationship well-being.  Sexual activity.  Eating habits.  History of falls.  Memory and ability to understand (cognition).  Work and work Statistician.  Reproductive health. Screening  You may have the following tests or measurements:  Height, weight, and BMI.  Blood pressure.  Lipid and cholesterol levels. These may be checked every 5 years, or more frequently if you are over 47 years old.  Skin check.  Lung cancer screening. You may have this screening every year starting at age 55 if you have a 30-pack-year history of smoking and currently smoke or have quit within the past 15 years.  Fecal occult blood test (FOBT) of the stool. You may have this test every year starting at age 40.  Flexible sigmoidoscopy or colonoscopy. You may have a sigmoidoscopy every 5 years or a colonoscopy every 10 years starting at age 106.  Hepatitis C blood test.  Hepatitis B blood test.  Sexually transmitted disease (STD) testing.  Diabetes screening. This is done by checking your blood sugar (glucose) after you have not eaten for a while (fasting). You may have this done every 1-3 years.  Bone density scan. This is done to screen for osteoporosis. You may have this done starting at age 41.  Mammogram. This may be done every 1-2 years. Talk to your health care provider about how often you should have regular mammograms. Talk with your health care provider about your test results, treatment options, and if necessary, the need for more tests. Vaccines  Your health care provider may recommend certain vaccines, such as:  Influenza vaccine. This is recommended  every year.  Tetanus, diphtheria, and acellular pertussis (Tdap, Td) vaccine. You may need a Td booster every 10 years.  Zoster vaccine. You may need this after age 43.  Pneumococcal 13-valent conjugate (PCV13) vaccine.  One dose is recommended after age 83.  Pneumococcal polysaccharide (PPSV23) vaccine. One dose is recommended after age 39. Talk to your health care provider about which screenings and vaccines you need and how often you need them. This information is not intended to replace advice given to you by your health care provider. Make sure you discuss any questions you have with your health care provider. Document Released: 05/03/2015 Document Revised: 12/25/2015 Document Reviewed: 02/05/2015 Elsevier Interactive Patient Education  2017 [employer] Prevention in the Home Falls can cause injuries. They can happen to people of all ages. There are many things you can do to make your home safe and to help prevent falls. What can I do on the outside of my home?  Regularly fix the edges of walkways and driveways and fix any cracks.  Remove anything that might make you trip as you walk through a door, such as a raised step or threshold.  Trim any bushes or trees on the path to your home.  Use bright outdoor lighting.  Clear any walking paths of anything that might make someone trip, such as rocks or tools.  Regularly check to see if handrails are loose or broken. Make sure that both sides of any steps have handrails.  Any raised decks and porches should have guardrails on the edges.  Have any leaves, snow, or ice cleared regularly.  Use sand or salt on walking paths during winter.  Clean up any spills in your garage right away. This includes oil or grease spills. What can I do in the bathroom?  Use night lights.  Install grab bars by the toilet and in the tub and shower. Do not use towel bars as grab bars.  Use non-skid mats or decals in the tub or shower.  If you need to sit down in the shower, use a plastic, non-slip stool.  Keep the floor dry. Clean up any water that spills on the floor as soon as it happens.  Remove soap buildup in the tub or shower regularly.  Attach bath  mats securely with double-sided non-slip rug tape.  Do not have throw rugs and other things on the floor that can make you trip. What can I do in the bedroom?  Use night lights.  Make sure that you have a light by your bed that is easy to reach.  Do not use any sheets or blankets that are too big for your bed. They should not hang down onto the floor.  Have a firm chair that has side arms. You can use this for support while you get dressed.  Do not have throw rugs and other things on the floor that can make you trip. What can I do in the kitchen?  Clean up any spills right away.  Avoid walking on wet floors.  Keep items that you use a lot in easy-to-reach places.  If you need to reach something above you, use a strong step stool that has a grab bar.  Keep electrical cords out of the way.  Do not use floor polish or wax that makes floors slippery. If you must use wax, use non-skid floor wax.  Do not have throw rugs and other things on the floor that can make you trip. What  can I do with my stairs?  Do not leave any items on the stairs.  Make sure that there are handrails on both sides of the stairs and use them. Fix handrails that are broken or loose. Make sure that handrails are as long as the stairways.  Check any carpeting to make sure that it is firmly attached to the stairs. Fix any carpet that is loose or worn.  Avoid having throw rugs at the top or bottom of the stairs. If you do have throw rugs, attach them to the floor with carpet tape.  Make sure that you have a light switch at the top of the stairs and the bottom of the stairs. If you do not have them, ask someone to add them for you. What else can I do to help prevent falls?  Wear shoes that:  Do not have high heels.  Have rubber bottoms.  Are comfortable and fit you well.  Are closed at the toe. Do not wear sandals.  If you use a stepladder:  Make sure that it is fully opened. Do not climb a closed  stepladder.  Make sure that both sides of the stepladder are locked into place.  Ask someone to hold it for you, if possible.  Clearly mark and make sure that you can see:  Any grab bars or handrails.  First and last steps.  Where the edge of each step is.  Use tools that help you move around (mobility aids) if they are needed. These include:  Canes.  Walkers.  Scooters.  Crutches.  Turn on the lights when you go into a dark area. Replace any light bulbs as soon as they burn out.  Set up your furniture so you have a clear path. Avoid moving your furniture around.  If any of your floors are uneven, fix them.  If there are any pets around you, be aware of where they are.  Review your medicines with your doctor. Some medicines can make you feel dizzy. This can increase your chance of falling. Ask your doctor what other things that you can do to help prevent falls. This information is not intended to replace advice given to you by your health care provider. Make sure you discuss any questions you have with your health care provider. Document Released: 01/31/2009 Document Revised: 09/12/2015 Document Reviewed: 05/11/2014 Elsevier Interactive Patient Education  2017 [employer].

## 2018-12-02 LAB — BMP8+EGFR
BUN/Creatinine Ratio: 13 (ref 12–28)
BUN: 17 mg/dL (ref 8–27)
CO2: 25 mmol/L (ref 20–29)
Calcium: 10 mg/dL (ref 8.7–10.3)
Chloride: 100 mmol/L (ref 96–106)
Creatinine, Ser: 1.31 mg/dL — ABNORMAL HIGH (ref 0.57–1.00)
GFR calc Af Amer: 44 mL/min/{1.73_m2} — ABNORMAL LOW (ref >59)
GFR calc non Af Amer: 38 mL/min/{1.73_m2} — ABNORMAL LOW (ref >59)
Glucose: 99 mg/dL (ref 65–99)
Potassium: 3.1 mmol/L — ABNORMAL LOW (ref 3.5–5.2)
Sodium: 141 mmol/L (ref 134–144)

## 2018-12-02 LAB — SEDIMENTATION RATE: Sed Rate: 16 mm/hr (ref 0–40)

## 2018-12-02 LAB — TSH: TSH: 2.25 u[IU]/mL (ref 0.450–4.500)

## 2018-12-06 ENCOUNTER — Telehealth: Payer: Self-pay

## 2018-12-06 NOTE — Telephone Encounter (Signed)
Patient called stating she has taken 4 capsules  Of linzess for constipation and she has only  Had one smal BM. She wants to discuss some different options.  I RETURNED PT CALL AND ADVISED HER AFTER SPEAKING WITH [name] MOORE FNP-BC THAT WE WILL INCREASE HER LINZESS 72MCG TO 145 MCG. PT WILL COME PICK UP SOME SAMPLES TOMORROW. Lonia Mad

## 2018-12-08 ENCOUNTER — Telehealth: Payer: Self-pay

## 2018-12-08 NOTE — Telephone Encounter (Signed)
Patient called stating she is unsure if she is to continue taking linzess 72 mcg 2 tablets or 2 tablets of the linzess 132mcg. She noticed the dose is diffierent.  I RETURNED PT CALL AND ADVISED HER THAT [name] HAS INCREASED HER DOSE AND SHE CAN GO AHEAD AND TAKE 1 TABLET OF LINZESS 145MCG. YRL,RMA

## 2018-12-22 ENCOUNTER — Encounter: Payer: Self-pay | Admitting: Nurse Practitioner

## 2018-12-28 ENCOUNTER — Other Ambulatory Visit: Payer: Medicare Other

## 2019-01-03 ENCOUNTER — Encounter: Payer: Self-pay | Admitting: Nurse Practitioner

## 2019-01-03 ENCOUNTER — Ambulatory Visit (INDEPENDENT_AMBULATORY_CARE_PROVIDER_SITE_OTHER): Payer: Medicare Other | Admitting: Nurse Practitioner

## 2019-01-03 ENCOUNTER — Other Ambulatory Visit: Payer: Self-pay

## 2019-01-03 VITALS — BP 100/60 | HR 68 | Temp 97.7°F | Ht 60.4 in | Wt 137.4 lb

## 2019-01-03 DIAGNOSIS — K5900 Constipation, unspecified: Secondary | ICD-10-CM

## 2019-01-03 DIAGNOSIS — I959 Hypotension, unspecified: Secondary | ICD-10-CM | POA: Diagnosis not present

## 2019-01-03 DIAGNOSIS — Z23 Encounter for immunization: Secondary | ICD-10-CM | POA: Diagnosis not present

## 2019-01-03 DIAGNOSIS — R350 Frequency of micturition: Secondary | ICD-10-CM

## 2019-01-03 MED ORDER — DOCUSATE SODIUM 100 MG PO CAPS: 100.0000 mg | ORAL_CAPSULE | Freq: Every day | ORAL | 1 refills | Status: AC | PRN

## 2019-01-03 NOTE — Patient Instructions (Addendum)
Stop taking chlorthalidone for now due to your low blood pressure.  Take colace once per day unless your bowel movements are watery  Influenza Virus Vaccine (Flucelvax) What is this medicine? INFLUENZA VIRUS VACCINE (in floo EN zuh VAHY ruhs vak SEEN) helps to reduce the risk of getting influenza also known as the flu. The vaccine only helps protect you against some strains of the flu. This medicine may be used for other purposes; ask your health care provider or pharmacist if you have questions. COMMON BRAND NAME(S): FLUCELVAX What should I tell my health care provider before I take this medicine? They need to know if you have any of these conditions:  bleeding disorder like hemophilia  fever or infection  Guillain-Barre syndrome or other neurological problems  immune system problems  infection with the human immunodeficiency virus (HIV) or AIDS  low blood platelet counts  multiple sclerosis  an unusual or allergic reaction to influenza virus vaccine, other medicines, foods, dyes or preservatives  pregnant or trying to get pregnant  breast-feeding How should I use this medicine? This vaccine is for injection into a muscle. It is given by a health care professional. A copy of Vaccine Information Statements will be given before each vaccination. Read this sheet carefully each time. The sheet may change frequently. Talk to your pediatrician regarding the use of this medicine in children. Special care may be needed. Overdosage: If you think you've taken too much of this medicine contact a poison control center or emergency room at once. Overdosage: If you think you have taken too much of this medicine contact a poison control center or emergency room at once. NOTE: This medicine is only for you. Do not share this medicine with others. What if I miss a dose? This does not apply. What may interact with this medicine?  chemotherapy or radiation therapy  medicines that lower your  immune system like etanercept, anakinra, infliximab, and adalimumab  medicines that treat or prevent blood clots like warfarin  phenytoin  steroid medicines like prednisone or cortisone  theophylline  vaccines This list may not describe all possible interactions. Give your health care provider a list of all the medicines, herbs, non-prescription drugs, or dietary supplements you use. Also tell them if you smoke, drink alcohol, or use illegal drugs. Some items may interact with your medicine. What should I watch for while using this medicine? Report any side effects that do not go away within 3 days to your doctor or health care professional. Call your health care provider if any unusual symptoms occur within 6 weeks of receiving this vaccine. You may still catch the flu, but the illness is not usually as bad. You cannot get the flu from the vaccine. The vaccine will not protect against colds or other illnesses that may cause fever. The vaccine is needed every year. What side effects may I notice from receiving this medicine? Side effects that you should report to your doctor or health care professional as soon as possible:  allergic reactions like skin rash, itching or hives, swelling of the face, lips, or tongue Side effects that usually do not require medical attention (Report these to your doctor or health care professional if they continue or are bothersome.):  fever  headache  muscle aches and pains  pain, tenderness, redness, or swelling at the injection site  tiredness This list may not describe all possible side effects. Call your doctor for medical advice about side effects. You may report side effects to  FDA at 1-800-FDA-1088. Where should I keep my medicine? The vaccine will be given by a health care professional in a clinic, pharmacy, doctor's office, or other health care setting. You will not be given vaccine doses to store at home. NOTE: This sheet is a summary. It may  not cover all possible information. If you have questions about this medicine, talk to your doctor, pharmacist, or health care provider.  2020 Elsevier/Gold Standard (2011-03-18 14:06:47)

## 2019-01-03 NOTE — Progress Notes (Signed)
Subjective:     Patient ID: Joy Cain , female    DOB: 04/17/1936 , 83 y.o.   MRN: [id number]   Chief Complaint  Patient presents with  . Constipation    patient stated the linzess helped some but she still was having BMs 5 days apart and she has ran out of her medicine and it has been about a week and a half since she has had a BM    HPI  With the Linzess 145mg  improved to every 4 days versus every 5 days.  Continues to have same discomfort at the bottom of her stomach.    Constipation This is a chronic problem. The current episode started more than 1 year ago. Pertinent negatives include no back pain or diarrhea. Treatments tried: she has been on linzess 145 mg. There is no history of abdominal surgery.     Past Medical History:  Diagnosis Date  . Arthritis   . Asthma   . Atypical chest pain   . Breast cancer ([facility])   . Dysphagia   . Dysphagia   . GERD (gastroesophageal reflux disease)   . Hypertension   . Moderate aortic regurgitation   . Osteoporosis      Family History  Problem Relation Age of Onset  . Heart disease Mother        d.95  . Stroke Father        d.60  . Breast cancer Sister 18       d.70s from breast cancer  . Prostate cancer Brother        d.40s from prostate cancer  . Cancer Maternal Uncle        d.80s from unspecified form of cancer  . Breast cancer Sister 23  . Cancer Brother        d.80s from unspecified form of cancer  . Cancer Other        d.38s from unspecified form of cancer. This is the daughter of an unaffected brother.     Current Outpatient Medications:  .  amLODipine (NORVASC) 10 MG tablet, Take 1 tablet (10 mg total) by mouth daily., Disp: 180 tablet, Rfl: 3 .  Calcium Carb-Cholecalciferol (CALCIUM 500+D PO), Take 2 tablets by mouth daily., Disp: , Rfl:  .  chlorthalidone (HYGROTON) 25 MG tablet, Take 1 tablet (25 mg total) by mouth daily. Is systolie blood pressure (top number) stays above 140, then you may increase to 1 tab  once daily., Disp: 90 tablet, Rfl: 3 .  cholecalciferol (VITAMIN D3) 25 MCG (1000 UT) tablet, Take 1,000 Units by mouth daily., Disp: , Rfl:  .  dorzolamide-timolol (COSOPT) 22.3-6.8 MG/ML ophthalmic solution, Apply to eye., Disp: , Rfl:  .  TURMERIC PO, Take by mouth., Disp: , Rfl:  .  vitamin B-12 (CYANOCOBALAMIN) 1000 MCG tablet, Take 1,000 mcg by mouth daily., Disp: , Rfl:  .  vitamin C (ASCORBIC ACID) 500 MG tablet, Take 500 mg by mouth daily., Disp: , Rfl:  .  linaclotide (LINZESS) 72 MCG capsule, Take 1 capsule (72 mcg total) by mouth daily before breakfast. (Patient not taking: Reported on 01/03/2019), Disp: 90 capsule, Rfl: 1   Allergies  Allergen Reactions  . Aspirin Other (See Comments)    GI TRACT UPSET     Review of Systems  Constitutional: Negative.   Respiratory: Negative.   Cardiovascular: Negative.  Negative for chest pain, palpitations and leg swelling.  Gastrointestinal: Positive for constipation. Negative for diarrhea.  Musculoskeletal: Negative for back pain.  Neurological: Negative for dizziness and headaches.     Today's Vitals   01/03/19 1121  BP: 100/60  Pulse: 68  Temp: 97.7 F (36.5 C)  TempSrc: Oral  Weight: 137 lb 6.4 oz (62.3 kg)  Height: 5' 0.4" (1.534 m)  PainSc: 0-No pain   Body mass index is 26.48 kg/m.   Objective:  Physical Exam Constitutional:      Appearance: Normal appearance.  Cardiovascular:     Rate and Rhythm: Normal rate and regular rhythm.     Pulses: Normal pulses.     Heart sounds: Normal heart sounds. No murmur.  Pulmonary:     Effort: Pulmonary effort is normal. No respiratory distress.     Breath sounds: Normal breath sounds.  Abdominal:     General: Abdomen is flat. There is no distension.     Tenderness: There is no abdominal tenderness.  Neurological:     Mental Status: She is alert.         Assessment And Plan:     1. Constipation, unspecified constipation type  appt with GI next week  Given samples of  linzess 142 mg - docusate sodium (COLACE) 100 MG capsule; Take 1 capsule (100 mg total) by mouth daily as needed.  Dispense: 30 capsule; Refill: 1  2. Urinary frequency  Urinalysis is normal  Encouraged to drink adequate amounts of water  3. Hypotension, unspecified hypotension type  Blood pressure is low but she is not having any symptoms  Encouraged to increase her fluid intake which may help her blood pressure  Also encouraged to eat a small amount of salt  4. Need for influenza vaccination  Influenza vaccine given in office  Advised to take Tylenol as needed for muscle aches or fever - Flu vaccine HIGH DOSE PF (Fluzone High dose)    Minette Brine, FNP    THE PATIENT IS ENCOURAGED TO PRACTICE SOCIAL DISTANCING DUE TO THE COVID-19 PANDEMIC.

## 2019-01-10 ENCOUNTER — Other Ambulatory Visit: Payer: Self-pay

## 2019-01-10 ENCOUNTER — Ambulatory Visit (INDEPENDENT_AMBULATORY_CARE_PROVIDER_SITE_OTHER): Payer: Medicare Other | Admitting: Nurse Practitioner

## 2019-01-10 ENCOUNTER — Encounter: Payer: Self-pay | Admitting: Nurse Practitioner

## 2019-01-10 VITALS — BP 98/58 | HR 63 | Temp 98.8°F | Ht 60.05 in | Wt 140.0 lb

## 2019-01-10 DIAGNOSIS — K5909 Other constipation: Secondary | ICD-10-CM | POA: Diagnosis not present

## 2019-01-10 DIAGNOSIS — Z86010 Personal history of colonic polyps: Secondary | ICD-10-CM | POA: Diagnosis not present

## 2019-01-10 LAB — POCT URINALYSIS DIPSTICK
Bilirubin, UA: NEGATIVE
Blood, UA: NEGATIVE
Glucose, UA: NEGATIVE
Ketones, UA: NEGATIVE
Leukocytes, UA: NEGATIVE
Nitrite, UA: NEGATIVE
Protein, UA: NEGATIVE
Spec Grav, UA: 1.025 (ref 1.010–1.025)
Urobilinogen, UA: 0.2 E.U./dL
pH, UA: 6.5 (ref 5.0–8.0)

## 2019-01-10 MED ORDER — PEG 3350-KCL-NA BICARB-NACL 420 G PO SOLR: 4000.0000 mL | Freq: Once | ORAL | 0 refills | Status: AC

## 2019-01-10 NOTE — Progress Notes (Signed)
ASSESSMENT / PLAN:   50.  83 year old female with progression of chronic constipation of the last several months.  Possibly medication induced.  Possibly diet related.  Her TSH is normal.  -Patient is not satisfied with results of Linzess as she is only having a BM every few days.  She has purchased Colace, encouraged her to try it.  If constipation persists then will change her to Amitiza -Continue on increasing daily water intake -Further evaluation at time of colonoscopy, see #2  2.  History of adenomatous colon polyps.  A small adenoma was removed at time of last colonoscopy at [facility] in 2014.  -In light of bowel changes and history of polyps will schedule patient for colonoscopy. The risks and benefits of colonoscopy with possible polypectomy / biopsies were discussed and the patient agrees to proceed.   HPI:    Chief Complaint:   Constipation Referring Provider: Minette Brine, FNP  Patient is an 83 year old female, new to the practice with a past medical history significant for remote breast cancer status post left mastectomy, cholecystectomy, HTN and GERD. She is here for evaluation of constipation.  Constipation is actually a chronic problem but progressive over the last several months.  She did start Norvasc several months ago.  PCP gave her a trial of Linzess 72 mcg, this was later increased to 145 mcg and she was able to have a bowel movement every few days.  Prior to that her bowels are moving only about once a week.  She has picked up some Colace stool softeners but not yet started them.  As recommended by PCP patient has been trying to increase her water intake to five 16 oz bottles / day.  Was on fiber but had to stop it due to bloating.  Apples and grapes help with the constipation.  Patient was followed at [facility] from 20 14-20 16.  She has a history of Candida esophagitis and colon polyps.  Her last colonoscopy was June 2014. Two 3 mm polyps moved in the descending  colon and a 19mm polyps was removed from the rectum.  Path compatible with tubular adenoma and hyperplastic polyp.   Data Reviewed:  12/01/2018 Normal TSH Cr 1.31  06/03/2018 CT AP with contrast for evaluation of periumbilical pain -small hiatal hernia, numerous diverticuli throughout the colon,  normal-appearing small bowel except for a 3.1 cm diverticulum in the third portion of the duodenum   Past Medical History:  Diagnosis Date   Arthritis    Asthma    Atypical chest pain    Breast cancer ([facility])    Dysphagia    Dysphagia    GERD (gastroesophageal reflux disease)    Hypertension    Moderate aortic regurgitation    Osteoporosis      Past Surgical History:  Procedure Laterality Date   BUNIONECTOMY     CHOLECYSTECTOMY  09/2015   HAMMER TOE SURGERY     MASTECTOMY     MASTECTOMY Left 1987   Family History  Problem Relation Age of Onset   Heart disease Mother        d.95   Stroke Father        d.46   Breast cancer Sister 67       d.70s from breast cancer   Prostate cancer Brother        d.40s from prostate cancer   Cancer Maternal Uncle  d.80s from unspecified form of cancer   Breast cancer Sister 76   Cancer Brother        d.80s from unspecified form of cancer   Cancer Other        d.38s from unspecified form of cancer. This is the daughter of an unaffected brother.   Social History   Tobacco Use   Smoking status: Never Smoker   Smokeless tobacco: Never Used  Substance Use Topics   Alcohol use: No    Alcohol/week: 0.0 standard drinks   Drug use: No   Current Outpatient Medications  Medication Sig Dispense Refill   Calcium Carb-Cholecalciferol (CALCIUM 500+D PO) Take 2 tablets by mouth daily.     cholecalciferol (VITAMIN D3) 25 MCG (1000 UT) tablet Take 1,000 Units by mouth daily.     docusate sodium (COLACE) 100 MG capsule Take 1 capsule (100 mg total) by mouth daily as needed. 30 capsule 1   dorzolamide-timolol  (COSOPT) 22.3-6.8 MG/ML ophthalmic solution Apply to eye.     TURMERIC PO Take by mouth.     vitamin B-12 (CYANOCOBALAMIN) 1000 MCG tablet Take 1,000 mcg by mouth daily.     vitamin C (ASCORBIC ACID) 500 MG tablet Take 500 mg by mouth daily.     amLODipine (NORVASC) 10 MG tablet Take 1 tablet (10 mg total) by mouth daily. 180 tablet 3   chlorthalidone (HYGROTON) 25 MG tablet Take 1 tablet (25 mg total) by mouth daily. Is systolie blood pressure (top number) stays above 140, then you may increase to 1 tab once daily. 90 tablet 3   No current facility-administered medications for this visit.    Allergies  Allergen Reactions   Aspirin Other (See Comments)    GI TRACT UPSET     Review of Systems: Positive for arthritis . All other systems reviewed and negative except where noted in HPI.    Physical Exam:    Wt Readings from Last 3 Encounters:  01/10/19 140 lb (63.5 kg)  01/03/19 137 lb 6.4 oz (62.3 kg)  12/01/18 140 lb (63.5 kg)    BP (!) 98/58    Pulse 63    Temp 98.8 F (37.1 C)    Ht 5' 0.05" (1.525 m)    Wt 140 lb (63.5 kg)    BMI 27.30 kg/m  Constitutional:  Pleasant female in no acute distress. Psychiatric: Normal mood and affect. Behavior is normal. EENT: Pupils normal.  Conjunctivae are normal. No scleral icterus. Neck supple.  Cardiovascular: Normal rate, regular rhythm. No edema Pulmonary/chest: Effort normal and breath sounds normal. No wheezing, rales or rhonchi. Abdominal: Soft, nondistended, nontender. Bowel sounds active throughout. There are no masses palpable. No hepatomegaly. Neurological: Alert and oriented to person place and time. Skin: Skin is warm and dry. No rashes noted.  Tye Savoy, NP  01/10/2019, 11:14 AM  Cc: Minette Brine, FNP

## 2019-01-10 NOTE — Patient Instructions (Signed)
If you are age 81 or older, your body mass index should be between 23-30. Your Body mass index is 27.3 kg/m. If this is out of the aforementioned range listed, please consider follow up with your Primary Care Provider.  If you are age 33 or younger, your body mass index should be between 19-25. Your Body mass index is 27.3 kg/m. If this is out of the aformentioned range listed, please consider follow up with your Primary Care Provider.   You have been scheduled for a colonoscopy. Please follow written instructions given to you at your visit today.  Please pick up your prep supplies at the pharmacy within the next 1-3 days. If you use inhalers (even only as needed), please bring them with you on the day of your procedure. Your physician has requested that you go to www.startemmi.com and enter the access code given to you at your visit today. This web site gives a general overview about your procedure. However, you should still follow specific instructions given to you by our office regarding your preparation for the procedure.  We have sent the following medications to your pharmacy for you to pick up at your convenience: Golytely  Start Colace and stay on [place].  Call in two weeks if stool softener and Linzess not working.  Will change to Amitiza.  Thank you for choosing me and [facility].   Tye Savoy, NP

## 2019-01-11 ENCOUNTER — Encounter: Payer: Self-pay | Admitting: Nurse Practitioner

## 2019-01-12 NOTE — Progress Notes (Signed)
Attending Physician's Attestation   I have reviewed the chart.   I agree with the Advanced Practitioner's note, impression, and recommendations with any updates as below.  Agree with colonoscopic evaluation to rule out obstructive lesion and then will proceed with medication adjustment and management.  Agree with transition to Amitiza if issues persist.  Could consider Trulance as well.  Justice Britain, MD [facility] Advanced [facility]oscopy Office # [phone]

## 2019-01-24 ENCOUNTER — Encounter: Payer: Self-pay | Admitting: Gastroenterology

## 2019-01-25 ENCOUNTER — Ambulatory Visit: Payer: Medicare Other | Admitting: Cardiology

## 2019-01-31 ENCOUNTER — Encounter: Payer: Medicare Other | Admitting: Gastroenterology

## 2019-02-06 ENCOUNTER — Telehealth: Payer: Self-pay

## 2019-02-06 NOTE — Telephone Encounter (Signed)
Called back and answered "NO" to all screening questions. Advised to check in on the fourth floor. Had no further questions.

## 2019-02-06 NOTE — Telephone Encounter (Signed)
Covid-19 screening questions   Do you now or have you had a fever in the last 14 days?  Do you have any respiratory symptoms of shortness of breath or cough now or in the last 14 days?  Do you have any family members or close contacts with diagnosed or suspected Covid-19 in the past 14 days?  Have you been tested for Covid-19 and found to be positive?       

## 2019-02-07 ENCOUNTER — Ambulatory Visit (AMBULATORY_SURGERY_CENTER): Payer: Medicare Other | Admitting: Gastroenterology

## 2019-02-07 ENCOUNTER — Encounter: Payer: Self-pay | Admitting: Gastroenterology

## 2019-02-07 ENCOUNTER — Other Ambulatory Visit: Payer: Self-pay

## 2019-02-07 VITALS — BP 139/65 | HR 68 | Temp 98.0°F | Resp 20 | Ht 60.0 in | Wt 140.0 lb

## 2019-02-07 DIAGNOSIS — K5900 Constipation, unspecified: Secondary | ICD-10-CM

## 2019-02-07 DIAGNOSIS — K648 Other hemorrhoids: Secondary | ICD-10-CM | POA: Diagnosis not present

## 2019-02-07 DIAGNOSIS — K5730 Diverticulosis of large intestine without perforation or abscess without bleeding: Secondary | ICD-10-CM | POA: Diagnosis not present

## 2019-02-07 DIAGNOSIS — Z86010 Personal history of colonic polyps: Secondary | ICD-10-CM

## 2019-02-07 DIAGNOSIS — D122 Benign neoplasm of ascending colon: Secondary | ICD-10-CM

## 2019-02-07 DIAGNOSIS — R194 Change in bowel habit: Secondary | ICD-10-CM | POA: Diagnosis not present

## 2019-02-07 DIAGNOSIS — K635 Polyp of colon: Secondary | ICD-10-CM

## 2019-02-07 MED ORDER — SODIUM CHLORIDE 0.9 % IV SOLN: 500.0000 mL | Freq: Once | INTRAVENOUS | Status: DC

## 2019-02-07 NOTE — Patient Instructions (Addendum)
YOU HAD AN ENDOSCOPIC PROCEDURE TODAY AT [facility]:   Refer to the procedure report that was given to you for any specific questions about what was found during the examination.  If the procedure report does not answer your questions, please call your gastroenterologist to clarify.  If you requested that your care partner not be given the details of your procedure findings, then the procedure report has been included in a sealed envelope for you to review at your convenience later.  YOU SHOULD EXPECT: Some feelings of bloating in the abdomen. Passage of more gas than usual.  Walking can help get rid of the air that was put into your GI tract during the procedure and reduce the bloating. If you had a lower endoscopy (such as a colonoscopy or flexible sigmoidoscopy) you may notice spotting of blood in your stool or on the toilet paper. If you underwent a bowel prep for your procedure, you may not have a normal bowel movement for a few days.  Please Note:  You might notice some irritation and congestion in your nose or some drainage.  This is from the oxygen used during your procedure.  There is no need for concern and it should clear up in a day or so.  SYMPTOMS TO REPORT IMMEDIATELY:   Following lower endoscopy (colonoscopy or flexible sigmoidoscopy):  Excessive amounts of blood in the stool  Significant tenderness or worsening of abdominal pains  Swelling of the abdomen that is new, acute  Fever of 100F or higher    For urgent or emergent issues, a gastroenterologist can be reached at any hour by calling [phone].   DIET:  We do recommend a small meal at first, but then you may proceed to your regular diet.  Drink plenty of fluids but you should avoid alcoholic beverages for 24 hours.  ACTIVITY:  You should plan to take it easy for the rest of today and you should NOT DRIVE or use heavy machinery until tomorrow (because of the sedation medicines used during the test).     FOLLOW UP: Our staff will call the number listed on your records 48-72 hours following your procedure to check on you and address any questions or concerns that you may have regarding the information given to you following your procedure. If we do not reach you, we will leave a message.  We will attempt to reach you two times.  During this call, we will ask if you have developed any symptoms of COVID 19. If you develop any symptoms (ie: fever, flu-like symptoms, shortness of breath, cough etc.) before then, please call [phone].  If you test positive for Covid 19 in the 2 weeks post procedure, please call and report this information to [place].    If any biopsies were taken you will be contacted by phone or by letter within the next 1-3 weeks.  Please call us at [phone] if you have not heard about the biopsies in 3 weeks.    SIGNATURES/CONFIDENTIALITY: You and/or your care partner have signed paperwork which will be entered into your electronic medical record.  These signatures attest to the fact that that the information above on your After Visit Summary has been reviewed and is understood.  Full responsibility of the confidentiality of this discharge information lies with you and/or your care-partner.    Handouts were given to you on polyps, hemorrhoids,  diverticulosis, and a high fiber diet with liberal fluid intake. Use OTC original regular  METAMUCIL one teaspoon by mouth twice a day. Add OTC COLACE 9 (stool softener) 100 mg twice a day. The office will call you with an appointment to see either Dr. Rush Landmark or [place], [place] in 3-4 weeks to follow up on your constipation. You may resume your current medications today. Await biopsy results. Please call if any questions or concerns.

## 2019-02-07 NOTE — Op Note (Addendum)
[facility] Patient Name: Joy Cain Procedure Date: 02/07/2019 9:51 AM MRN: [id number] Endoscopist: Justice Britain , MD Age: 83 Referring MD:  Date of Birth: [birth year] Gender: Female Account #: [id number] Procedure:                Colonoscopy Indications:              Change in bowel habits, Constipation Medicines:                Monitored Anesthesia Care Procedure:                Pre-Anesthesia Assessment:                           - Prior to the procedure, a History and Physical                            was performed, and patient medications and                            allergies were reviewed. The patient's tolerance of                            previous anesthesia was also reviewed. The risks                            and benefits of the procedure and the sedation                            options and risks were discussed with the patient.                            All questions were answered, and informed consent                            was obtained. Prior Anticoagulants: The patient has                            taken no previous anticoagulant or antiplatelet                            agents. ASA Grade Assessment: III - A patient with                            severe systemic disease. After reviewing the risks                            and benefits, the patient was deemed in                            satisfactory condition to undergo the procedure.                           After obtaining informed consent, the colonoscope  was passed under direct vision. Throughout the                            procedure, the patient's blood pressure, pulse, and                            oxygen saturations were monitored continuously. The                            Colonoscope was introduced through the anus and                            advanced to the 5 cm into the ileum. The                            colonoscopy was performed  without difficulty. The                            patient tolerated the procedure. The quality of the                            bowel preparation was good. The ileocecal valve,                            appendiceal orifice, and rectum were photographed. Scope In: 9:59:22 AM Scope Out: 10:19:06 AM Scope Withdrawal Time: 0 hours 14 minutes 23 seconds  Total Procedure Duration: 0 hours 19 minutes 44 seconds  Findings:                 The perianal and digital rectal examinations were                            normal. Pertinent negatives include no palpable                            rectal lesions.                           The terminal ileum and ileocecal valve appeared                            normal.                           A 2 mm polyp was found in the ascending colon. The                            polyp was sessile. The polyp was removed with a                            cold snare. Resection and retrieval were complete.                           Many small and large-mouthed diverticula were found  in the entire colon.                           Normal mucosa was found in the entire colon                            otherwise.                           Non-bleeding non-thrombosed internal hemorrhoids                            were found during retroflexion, during perianal                            exam and during digital exam. The hemorrhoids were                            Grade I (internal hemorrhoids that do not prolapse). Complications:            No immediate complications. Estimated Blood Loss:     Estimated blood loss was minimal. Impression:               - The examined portion of the ileum was normal.                           - One 2 mm polyp in the ascending colon, removed                            with a cold snare. Resected and retrieved.                           - Diverticulosis in the entire examined colon.                            - Normal mucosa in the entire examined colon                            otherwise.                           - Non-bleeding non-thrombosed internal hemorrhoids. Recommendation:           - The patient will be observed post-procedure,                            until all discharge criteria are met.                           - Discharge patient to home.                           - Patient has a contact number available for                            emergencies. The signs and symptoms of potential  delayed complications were discussed with the                            patient. Return to normal activities tomorrow.                            Written discharge instructions were provided to the                            patient.                           - High fiber diet.                           - Use original regular Metamucil one teaspoon PO                            BID.                           - Colace orally 100 mg BID.                           - Will schedule follow up with PA Chester Holstein or                            myself, If issues still persist, patient can be                            seen in follow up and Amitiza can be initiated v                            Trulance as had previously been discussed for                            constipation/changes in bowel habits.                           - Await pathology results.                           - Continue present medications.                           - Due to age and medical comorbidities, we would                            normally consider ending colon cancer                            screening/colon polyp surveillance. Based on final                            pathology would dictate follow up in likely 7-years  but will await the pathology. I suspect this will                            be your last colonoscopy.                           - The findings and  recommendations were discussed                            with the patient. Justice Britain, MD 02/07/2019 10:28:32 AM

## 2019-02-07 NOTE — Progress Notes (Signed)
No problems noted in the recovery room. maw 

## 2019-02-07 NOTE — Progress Notes (Signed)
Temp check by LC. Vital sign check by [place].  Medical and surgical history reviewed and verified with the patient.

## 2019-02-07 NOTE — Progress Notes (Signed)
Called to room to assist during endoscopic procedure.  Patient ID and intended procedure confirmed with present staff. Received instructions for my participation in the procedure from the performing physician.  

## 2019-02-07 NOTE — Progress Notes (Signed)
A/ox3, pleased with MAC, report to RN 

## 2019-02-09 ENCOUNTER — Telehealth: Payer: Self-pay

## 2019-02-09 ENCOUNTER — Telehealth: Payer: Self-pay | Admitting: *Deleted

## 2019-02-09 NOTE — Telephone Encounter (Signed)
Follow up phone call made. Unable to leave message.

## 2019-02-09 NOTE — Telephone Encounter (Signed)
  Follow up Call-  Call back number 02/07/2019  Post procedure Call Back phone  # [phone]  Permission to leave phone message No  Some recent data might be hidden     Patient questions:  Do you have a fever, pain , or abdominal swelling? No. Pain Score  0 *  Have you tolerated food without any problems? Yes.    Have you been able to return to your normal activities? Yes.    Do you have any questions about your discharge instructions: Diet   No. Medications  No. Follow up visit  No.  Do you have questions or concerns about your Care? No.  Actions: * If pain score is 4 or above: No action needed, pain <4. 1. Have you developed a fever since your procedure? no  2.   Have you had an respiratory symptoms (SOB or cough) since your procedure? no  3.   Have you tested positive for COVID 19 since your procedure no  4.   Have you had any family members/close contacts diagnosed with the COVID 19 since your procedure?  no   If yes to any of these questions please route to Joylene John, RN and Alphonsa Gin, Therapist, sports.

## 2019-02-10 ENCOUNTER — Encounter: Payer: Self-pay | Admitting: Gastroenterology

## 2019-02-13 ENCOUNTER — Telehealth: Payer: Self-pay

## 2019-02-13 NOTE — Telephone Encounter (Signed)
-----   Message from Irving Copas., MD sent at 02/10/2019  5:45 PM EDT ----- Regarding: Follow-up Joy Cain,Please have the patient follow-up with Nevin Bloodgood or myself in a few weeks to see how she is doing.  If her constipation is still an issue we will have to adjust her medications.Thanks.GM

## 2019-02-13 NOTE — Telephone Encounter (Signed)
appt made for 11-10 at 1130 am with Joy Cain letter mailed to the pt

## 2019-02-28 ENCOUNTER — Encounter: Payer: Self-pay | Admitting: Nurse Practitioner

## 2019-02-28 ENCOUNTER — Ambulatory Visit (INDEPENDENT_AMBULATORY_CARE_PROVIDER_SITE_OTHER): Payer: Medicare Other | Admitting: Nurse Practitioner

## 2019-02-28 VITALS — BP 92/58 | HR 72 | Temp 96.7°F | Ht 65.0 in | Wt 147.0 lb

## 2019-02-28 DIAGNOSIS — K5909 Other constipation: Secondary | ICD-10-CM | POA: Diagnosis not present

## 2019-02-28 NOTE — Patient Instructions (Signed)
If you are age 81 or older, your body mass index should be between 23-30. Your Body mass index is 24.46 kg/m. If this is out of the aforementioned range listed, please consider follow up with your Primary Care Provider.  If you are age 84 or younger, your body mass index should be between 19-25. Your Body mass index is 24.46 kg/m. If this is out of the aformentioned range listed, please consider follow up with your Primary Care Provider.   Follow up as needed.  Thank you for choosing me and [facility].   Tye Savoy, NP

## 2019-02-28 NOTE — Progress Notes (Signed)
       Chief Complaint:  Follow up     IMPRESSION and PLAN:    83 yo female here for follow up on chronic constipation. She has increased fluid intake. Can't tolerate daily stool softener ( diarrhea) so taking Colace 2-3 times a week. She is satisfied with bowel regimen at present. Reviewed recent colonoscopy findings with her.  -follow up prn.       HPI:     Patient is an 83 yo female who I saw late September for constipation and hx of colon polyps. She increased fluid intake, started Colace and was scheduled for a colonoscopy. See results below.    Joy Cain is here for follow up on constipation. She can only take the colace 2-3 times a week because it can otherwise cause diarrhea. Overall she is pleased with bowel regimen. We reviewed recent colonoscopy report.   Data Reviewed:  Colonoscopy 02/07/19 The examined portion of the ileum was normal. - One 2 mm polyp in the ascending colon, removed with a cold snare. Resected and retrieved. - Diverticulosis in the entire examined colon. - Normal mucosa in the entire examined colon otherwise. - Non-bleeding non-thrombosed internal hemorrhoids.  Path - non-adenomatous tissue  Review of systems:     No chest pain, no SOB, no fevers, no urinary sx   Past Medical History:  Diagnosis Date  . Arthritis   . Asthma   . Atypical chest pain   . Breast cancer ([place])   . Dysphagia   . Dysphagia   . GERD (gastroesophageal reflux disease)   . Hypertension   . Moderate aortic regurgitation   . Osteoporosis     Patient's surgical history, family medical history, social history, medications and allergies were all reviewed in Epic     Current Outpatient Medications  Medication Sig Dispense Refill  . Calcium Carb-Cholecalciferol (CALCIUM 500+D PO) Take 2 tablets by mouth daily.    . cholecalciferol (VITAMIN D3) 25 MCG (1000 UT) tablet Take 1,000 Units by mouth daily.    Marland Kitchen docusate sodium (COLACE) 100 MG capsule Take 1 capsule (100 mg  total) by mouth daily as needed. 30 capsule 1  . dorzolamide-timolol (COSOPT) 22.3-6.8 MG/ML ophthalmic solution Apply to eye.    . TURMERIC PO Take by mouth.    . vitamin B-12 (CYANOCOBALAMIN) 1000 MCG tablet Take 1,000 mcg by mouth daily.    . vitamin C (ASCORBIC ACID) 500 MG tablet Take 500 mg by mouth daily.    Marland Kitchen amLODipine (NORVASC) 10 MG tablet Take 1 tablet (10 mg total) by mouth daily. 180 tablet 3   No current facility-administered medications for this visit.     Physical Exam:     BP (!) 92/58 (BP Location: Right Arm, Patient Position: Sitting, Cuff Size: Normal)   Pulse 72   Temp (!) 96.7 F (35.9 C) (Other (Comment))   Ht 5\' 5"  (1.651 m)   Wt 147 lb (66.7 kg)   BMI 24.46 kg/m   GENERAL:  Pleasant female in NAD PSYCH: : Cooperative, normal affect EENT:  conjunctiva pink, mucous membranes moist, neck supple without masses CARDIAC:  RRR PULM: Normal respiratory effort ABDOMEN:  Nondistended, soft, nontender. No obvious masses, no hepatomegaly,  normal bowel sounds SKIN:  turgor, no lesions seen NEURO: Alert and oriented x 3, no focal neurologic deficits   Joy Cain , NP 02/28/2019, 11:40 AM

## 2019-03-01 ENCOUNTER — Encounter: Payer: Self-pay | Admitting: Nurse Practitioner

## 2019-03-02 NOTE — Progress Notes (Signed)
Attending Physician's Attestation   I have reviewed the chart.   I agree with the Advanced Practitioner's note, impression, and recommendations with any updates as below.    Gabriel Mansouraty, MD [facility] Advanced Endoscopy Office # [phone]  

## 2019-03-08 DIAGNOSIS — N8110 Cystocele, unspecified: Secondary | ICD-10-CM | POA: Diagnosis not present

## 2019-03-08 DIAGNOSIS — R10819 Abdominal tenderness, unspecified site: Secondary | ICD-10-CM | POA: Diagnosis not present

## 2019-03-08 DIAGNOSIS — Z853 Personal history of malignant neoplasm of breast: Secondary | ICD-10-CM | POA: Diagnosis not present

## 2019-03-08 DIAGNOSIS — Z9189 Other specified personal risk factors, not elsewhere classified: Secondary | ICD-10-CM | POA: Diagnosis not present

## 2019-03-14 DIAGNOSIS — R10819 Abdominal tenderness, unspecified site: Secondary | ICD-10-CM | POA: Diagnosis not present

## 2019-03-24 DIAGNOSIS — Z961 Presence of intraocular lens: Secondary | ICD-10-CM | POA: Diagnosis not present

## 2019-03-24 DIAGNOSIS — H401131 Primary open-angle glaucoma, bilateral, mild stage: Secondary | ICD-10-CM | POA: Diagnosis not present

## 2019-04-04 ENCOUNTER — Ambulatory Visit: Payer: Medicare Other | Admitting: Nurse Practitioner

## 2019-04-04 ENCOUNTER — Encounter: Payer: Self-pay | Admitting: Nurse Practitioner

## 2019-04-04 ENCOUNTER — Telehealth (INDEPENDENT_AMBULATORY_CARE_PROVIDER_SITE_OTHER): Payer: Medicare Other | Admitting: Nurse Practitioner

## 2019-04-04 ENCOUNTER — Other Ambulatory Visit: Payer: Self-pay

## 2019-04-04 VITALS — BP 132/74 | HR 74 | Temp 98.3°F | Wt 147.0 lb

## 2019-04-04 DIAGNOSIS — R42 Dizziness and giddiness: Secondary | ICD-10-CM

## 2019-04-04 DIAGNOSIS — R519 Headache, unspecified: Secondary | ICD-10-CM | POA: Diagnosis not present

## 2019-04-04 DIAGNOSIS — G8929 Other chronic pain: Secondary | ICD-10-CM | POA: Diagnosis not present

## 2019-04-04 DIAGNOSIS — I10 Essential (primary) hypertension: Secondary | ICD-10-CM | POA: Diagnosis not present

## 2019-04-04 MED ORDER — MECLIZINE HCL 12.5 MG PO TABS: 12.5000 mg | ORAL_TABLET | Freq: Three times a day (TID) | ORAL | 0 refills | Status: DC | PRN

## 2019-04-04 MED ORDER — AMLODIPINE BESYLATE 5 MG PO TABS: 5.0000 mg | ORAL_TABLET | Freq: Every day | ORAL | 1 refills | Status: DC

## 2019-04-04 NOTE — Progress Notes (Signed)
Virtual Visit via Telephone   This visit type was conducted due to national recommendations for restrictions regarding the COVID-19 Pandemic (e.g. social distancing) in an effort to limit this patient's exposure and mitigate transmission in our community.  Due to her co-morbid illnesses, this patient is at least at moderate risk for complications without adequate follow up.  This format is felt to be most appropriate for this patient at this time.  All issues noted in this document were discussed and addressed.  A limited physical exam was performed with this format.    This visit type was conducted due to national recommendations for restrictions regarding the COVID-19 Pandemic (e.g. social distancing) in an effort to limit this patient's exposure and mitigate transmission in our community.  Patients identity confirmed using two different identifiers.  This format is felt to be most appropriate for this patient at this time.  All issues noted in this document were discussed and addressed.  No physical exam was performed (except for noted visual exam findings with Video Visits).    Date:  04/15/2019   ID:  Joy Cain, DOB 09-16-1935, MRN [id number]  Patient Location:  Home - spoke with Joy Cain  Provider location:   Office    Chief Complaint:  Headache, dizziness  History of Present Illness:    Joy Cain is a 83 y.o. female who presents via video conferencing for a telehealth visit today.    The patient does not have symptoms concerning for COVID-19 infection (fever, chills, cough, or new shortness of breath).   She is having dizziness since Thursday constant.  She has been taking advil for the dizziness and feels has been effective.  Lightheadedness.  She is trying to do 64 oz a day. She had a colonoscopy on Nov 17th.  She has been tested for coronavirus twice in November both were negative.  Last Wednesday she took the car to be repaired and this is the only time she has been  around others but had a mask on  Hypertension This is a chronic problem. The current episode started more than 1 year ago. The problem is unchanged. The problem is controlled. Associated symptoms include headaches. Pertinent negatives include no malaise/fatigue. There are no associated agents to hypertension. Risk factors for coronary artery disease include obesity and sedentary lifestyle. Past treatments include calcium channel blockers. The current treatment provides significant improvement. Hypertensive end-organ damage includes CVA. There is no history of angina. There is no history of chronic renal disease.  Dizziness This is a chronic problem. The current episode started more than 1 month ago. The problem occurs intermittently. The problem has been gradually worsening. Associated symptoms include headaches. Pertinent negatives include no abdominal pain or coughing. Nothing aggravates the symptoms.     Past Medical History:  Diagnosis Date  . Arthritis   . Asthma   . Atypical chest pain   . Breast cancer ([place])   . Dysphagia   . Dysphagia   . GERD (gastroesophageal reflux disease)   . Hypertension   . Moderate aortic regurgitation   . Osteoporosis    Past Surgical History:  Procedure Laterality Date  . BUNIONECTOMY    . CHOLECYSTECTOMY  09/2015  . HAMMER TOE SURGERY    . MASTECTOMY    . MASTECTOMY Left 1987     Current Meds  Medication Sig  . Calcium Carb-Cholecalciferol (CALCIUM 500+D PO) Take 2 tablets by mouth daily.  . cholecalciferol (VITAMIN D3) 25 MCG (1000 UT) tablet Take 1,000  Units by mouth daily.  Marland Kitchen docusate sodium (COLACE) 100 MG capsule Take 1 capsule (100 mg total) by mouth daily as needed.  . dorzolamide-timolol (COSOPT) 22.3-6.8 MG/ML ophthalmic solution Apply to eye.  . vitamin B-12 (CYANOCOBALAMIN) 1000 MCG tablet Take 1,000 mcg by mouth daily.  . vitamin C (ASCORBIC ACID) 500 MG tablet Take 500 mg by mouth daily.  . [DISCONTINUED] amLODipine (NORVASC) 10  MG tablet Take 1 tablet (10 mg total) by mouth daily. (Patient taking differently: Take 10 mg by mouth daily. sometimes)     Allergies:   Aspirin   Social History   Tobacco Use  . Smoking status: Never Smoker  . Smokeless tobacco: Never Used  Substance Use Topics  . Alcohol use: No    Alcohol/week: 0.0 standard drinks  . Drug use: No     Family Hx: The patient's family history includes Breast cancer (age of onset: 41) in her sister; Breast cancer (age of onset: 73) in her sister; Cancer in her brother, maternal uncle, and another family member; Heart disease in her mother; Prostate cancer in her brother; Stroke in her father. There is no history of Colon cancer, Esophageal cancer, Stomach cancer, or Rectal cancer.  ROS:   Please see the history of present illness.    Review of Systems  Constitutional: Negative for malaise/fatigue and weight loss.  HENT: Positive for tinnitus. Negative for hearing loss.   Respiratory: Negative.  Negative for cough.   Cardiovascular: Negative.   Gastrointestinal: Negative for abdominal pain.  Neurological: Positive for dizziness and headaches. Negative for tingling.  Psychiatric/Behavioral: Negative.     All other systems reviewed and are negative.   Labs/Other Tests and Data Reviewed:    Recent Labs: 05/12/2018: ALT 9; Hemoglobin 13.2; Platelets 192 12/01/2018: BUN 17; Creatinine, Ser 1.31; Potassium 3.1; Sodium 141; TSH 2.250   Recent Lipid Panel Lab Results  Component Value Date/Time   CHOL 176 02/04/2015 01:05 AM   TRIG 41 02/04/2015 01:05 AM   HDL 45 02/04/2015 01:05 AM   CHOLHDL 3.9 02/04/2015 01:05 AM   LDLCALC 123 (H) 02/04/2015 01:05 AM    Wt Readings from Last 3 Encounters:  04/04/19 147 lb 0.8 oz (66.7 kg)  02/28/19 147 lb (66.7 kg)  02/07/19 140 lb (63.5 kg)     Exam:    Vital Signs:  BP 132/74 (BP Location: Left Arm, Patient Position: Sitting, Cuff Size: Small)   Pulse 74   Temp 98.3 F (36.8 C) (Oral)   Wt 147 lb  0.8 oz (66.7 kg)   BMI 24.47 kg/m     Physical Exam  Constitutional: She is oriented to person, place, and time and well-developed, well-nourished, and in no distress. No distress.  Pulmonary/Chest: Effort normal and breath sounds normal. No respiratory distress.  Neurological: She is alert and oriented to person, place, and time.  Psychiatric: Mood, memory, affect and judgment normal.    ASSESSMENT & PLAN:    1. Essential hypertension  Chronic  Continue with current medications - amLODipine (NORVASC) 5 MG tablet; Take 1 tablet (5 mg total) by mouth daily.  Dispense: 90 tablet; Refill: 1  2. Dizziness  Continues to have intermittent dizziness  Will treat with meclizine and order a CT scan of head due to previous history of CVA - CT Head Wo Contrast; Future - meclizine (ANTIVERT) 12.5 MG tablet; Take 1 tablet (12.5 mg total) by mouth 3 (three) times daily as needed for dizziness.  Dispense: 30 tablet; Refill: 0  3.  Chronic nonintractable headache, unspecified headache type  Take tylenol as needed  Will check CT scan of head to evaluate for structural damage  Pending results will refer to neurology for further evaluation - CT Head Wo Contrast; Future   COVID-19 Education: The signs and symptoms of COVID-19 were discussed with the patient and how to seek care for testing (follow up with PCP or arrange E-visit).  The importance of social distancing was discussed today.  Patient Risk:   After full review of this patients clinical status, I feel that they are at least moderate risk at this time.  Time:   Today, I have spent 12 minutes/ seconds with the patient with telehealth technology discussing above diagnoses.     Medication Adjustments/Labs and Tests Ordered: Current medicines are reviewed at length with the patient today.  Concerns regarding medicines are outlined above.   Tests Ordered: Orders Placed This Encounter  Procedures  . CT Head Wo Contrast     Medication Changes: Meds ordered this encounter  Medications  . amLODipine (NORVASC) 5 MG tablet    Sig: Take 1 tablet (5 mg total) by mouth daily.    Dispense:  90 tablet    Refill:  1  . meclizine (ANTIVERT) 12.5 MG tablet    Sig: Take 1 tablet (12.5 mg total) by mouth 3 (three) times daily as needed for dizziness.    Dispense:  30 tablet    Refill:  0    Disposition:  Follow up prn  Signed, Minette Brine, FNP

## 2019-04-08 DIAGNOSIS — Z03818 Encounter for observation for suspected exposure to other biological agents ruled out: Secondary | ICD-10-CM | POA: Diagnosis not present

## 2019-04-11 ENCOUNTER — Ambulatory Visit
Admission: RE | Admit: 2019-04-11 | Discharge: 2019-04-11 | Disposition: A | Discharge status: Home / Self Care | Payer: Medicare Other | Source: Ambulatory Visit | Attending: Nurse Practitioner | Admitting: Nurse Practitioner

## 2019-04-11 DIAGNOSIS — G8929 Other chronic pain: Secondary | ICD-10-CM

## 2019-04-11 DIAGNOSIS — R519 Headache, unspecified: Secondary | ICD-10-CM

## 2019-04-11 DIAGNOSIS — R42 Dizziness and giddiness: Secondary | ICD-10-CM

## 2019-04-15 ENCOUNTER — Other Ambulatory Visit: Payer: Self-pay | Admitting: Nurse Practitioner

## 2019-04-15 DIAGNOSIS — R42 Dizziness and giddiness: Secondary | ICD-10-CM

## 2019-04-15 DIAGNOSIS — R519 Headache, unspecified: Secondary | ICD-10-CM

## 2019-04-15 DIAGNOSIS — G8929 Other chronic pain: Secondary | ICD-10-CM

## 2019-04-19 NOTE — Progress Notes (Signed)
Noted  

## 2019-05-18 ENCOUNTER — Ambulatory Visit (INDEPENDENT_AMBULATORY_CARE_PROVIDER_SITE_OTHER): Payer: Medicare Other

## 2019-05-18 ENCOUNTER — Other Ambulatory Visit: Payer: Self-pay

## 2019-05-18 DIAGNOSIS — I351 Nonrheumatic aortic (valve) insufficiency: Secondary | ICD-10-CM | POA: Diagnosis not present

## 2019-06-01 ENCOUNTER — Telehealth: Payer: Self-pay

## 2019-06-01 NOTE — Telephone Encounter (Signed)
Patient called stating needs [place] to fax over her CT disc to her neurologist. I advised pt that she needs to call [facility] and have them to fax it over because we dont have the disc. YRL,RMA

## 2019-06-06 ENCOUNTER — Other Ambulatory Visit: Payer: Self-pay

## 2019-06-06 ENCOUNTER — Encounter: Payer: Self-pay | Admitting: Neurology

## 2019-06-06 ENCOUNTER — Ambulatory Visit: Payer: Medicare Other | Admitting: Neurology

## 2019-06-06 ENCOUNTER — Ambulatory Visit (INDEPENDENT_AMBULATORY_CARE_PROVIDER_SITE_OTHER): Payer: Medicare Other | Admitting: Neurology

## 2019-06-06 VITALS — Temp 97.2°F | Ht 61.5 in | Wt 148.0 lb

## 2019-06-06 DIAGNOSIS — Z853 Personal history of malignant neoplasm of breast: Secondary | ICD-10-CM

## 2019-06-06 DIAGNOSIS — G4489 Other headache syndrome: Secondary | ICD-10-CM | POA: Diagnosis not present

## 2019-06-06 DIAGNOSIS — R519 Headache, unspecified: Secondary | ICD-10-CM

## 2019-06-06 NOTE — Patient Instructions (Addendum)
Your neurological exam is normal which is reassuring.  As discussed, I would like to get to the bottom of these headaches by doing a brain MRI.  In preparation for your brain MRI with and without contrast I would like to do a blood test to make sure your kidney function is okay to proceed with the MRI with contrast. We will consider medication to prevent these headaches in the near future.  We will call you with your MRI results.Your headache description is not in keeping with migraine headaches.

## 2019-06-06 NOTE — Progress Notes (Signed)
Subjective:    Patient ID: Joy Cain is a 84 y.o. female.  HPI     Star Age, MD, PhD [facility] [address] [place], [place] [address]  Dear Doreene Burke,   I saw your patient, Joy Cain, upon your kind request in my neurologic clinic today for initial consultation of her dizziness and headaches.  The patient is unaccompanied today. As you know, Joy Cain is an 84 year old right-handed woman with an underlying medical history of hypertension, breast cancer with status post left mastectomy in 1986, asthma, arthritis, reflux disease, aortic regurgitation, and osteoporosis, who reports recurrent left-sided parietal headaches for the past 2 years.  Symptoms are nearly daily, she does not have associated nausea, vomiting, photophobia, sonophobia.  Headache description is constant and dull, achy, pressure-like, gets worse when she tries to lie down, better when she puts a hand under the right side of her head and lies on her right side to sleep.  She denies any snoring or nocturnal headaches or morning headaches, she does not wake up gasping and has not been told that she has pauses in her breathing, feels that she sleeps well.  She has no prior history of recurrent headaches or migraines growing up.  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  She has 1 daughter who lives in [place] and 2 sons.  She has 7 grandchildren, she is a non-smoker does not utilize any alcohol, drinks limited caffeine in the form of coffee, typically 1 cup/day in the mornings, tries to hydrate well with water.  She denies any sudden onset of one-sided weakness or numbness or tingling or droopy face or slurring of speech, headache severity is mild to moderate, Tylenol does not help, she currently does not take anything for it.  She has had intermittent dizziness for the past several months, she feels better.  Meclizine helped to some degree but  stopping the chlorthalidone some 2 or 3 months ago helped significantly. I reviewed your office note from 04/04/2019.  She had a head CT without contrast on 04/11/2019 and I reviewed the results: IMPRESSION: Negative CT of the brain. She had blood work through your office in August 2020 and ESR was normal at the time. She had an eye examination in the recent past, she had cataract surgeries last year.   Her Past Medical History Is Significant For: Past Medical History:  Diagnosis Date  . Arthritis   . Asthma   . Atypical chest pain   . Breast cancer ([place])   . Dysphagia   . Dysphagia   . GERD (gastroesophageal reflux disease)   . Hypertension   . Moderate aortic regurgitation   . Osteoporosis     Her Past Surgical History Is Significant For: Past Surgical History:  Procedure Laterality Date  . BUNIONECTOMY    . CHOLECYSTECTOMY  09/2015  . HAMMER TOE SURGERY    . MASTECTOMY    . MASTECTOMY Left 1987    Her Family History Is Significant For: Family History  Problem Relation Age of Onset  . Heart disease Mother        d.95  . Stroke Father        d.60  . Breast cancer Sister 43       d.70s from breast cancer  . Prostate cancer Brother        d.40s from prostate cancer  . Cancer Maternal Uncle  d.80s from unspecified form of cancer  . Breast cancer Sister 17  . Cancer Brother        d.80s from unspecified form of cancer  . Cancer Other        d.38s from unspecified form of cancer. This is the daughter of an unaffected brother.  . Colon cancer Neg Hx   . Esophageal cancer Neg Hx   . Stomach cancer Neg Hx   . Rectal cancer Neg Hx     Her Social History Is Significant For: Social History   Socioeconomic History  . Marital status: Married    Spouse name: Not on file  . Number of children: 3  . Years of education: Not on file  . Highest education level: Not on file  Occupational History  . Occupation: retired  Tobacco Use  . Smoking status: Never Smoker   . Smokeless tobacco: Never Used  Substance and Sexual Activity  . Alcohol use: No    Alcohol/week: 0.0 standard drinks  . Drug use: No  . Sexual activity: Yes  Other Topics Concern  . Not on file  Social History Narrative   Lives with husband in a 2 story home.  Has 3 children.    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     Social Determinants of Health   Financial Resource Strain: Low Risk   . Difficulty of Paying Living Expenses: Not hard at all  Food Insecurity: No Food Insecurity  . Worried About Charity fundraiser in the Last Year: Never true  . Ran Out of Food in the Last Year: Never true  Transportation Needs: No Transportation Needs  . Lack of Transportation (Medical): No  . Lack of Transportation (Non-Medical): No  Physical Activity: Inactive  . Days of Exercise per Week: 0 days  . Minutes of Exercise per Session: 0 min  Stress: No Stress Concern Present  . Feeling of Stress : Not at all  Social Connections:   . Frequency of Communication with Friends and Family: Not on file  . Frequency of Social Gatherings with Friends and Family: Not on file  . Attends Religious Services: Not on file  . Active Member of Clubs or Organizations: Not on file  . Attends Archivist Meetings: Not on file  . Marital Status: Not on file    Her Allergies Are:  Allergies  Allergen Reactions  . Aspirin Other (See Comments)    GI TRACT UPSET  :   Her Current Medications Are:  Outpatient Encounter Medications as of 06/06/2019  Medication Sig  . amLODipine (NORVASC) 5 MG tablet Take 1 tablet (5 mg total) by mouth daily.  . Calcium Carb-Cholecalciferol (CALCIUM 500+D PO) Take 2 tablets by mouth daily.  . cholecalciferol (VITAMIN D3) 25 MCG (1000 UT) tablet Take 1,000 Units by mouth daily.  Marland Kitchen docusate sodium (COLACE) 100 MG capsule Take 1 capsule (100 mg total) by mouth daily as needed.  . dorzolamide-timolol (COSOPT) 22.3-6.8 MG/ML ophthalmic solution Apply to eye.  .  meclizine (ANTIVERT) 12.5 MG tablet Take 1 tablet (12.5 mg total) by mouth 3 (three) times daily as needed for dizziness.  . TURMERIC PO Take by mouth.  . vitamin B-12 (CYANOCOBALAMIN) 1000 MCG tablet Take 1,000 mcg by mouth daily.  . vitamin C (ASCORBIC ACID) 500 MG tablet Take 500 mg by mouth daily.   No facility-administered encounter medications on file as of 06/06/2019.  : Review of Systems:  Out of a complete 14 point review  of systems, all are reviewed and negative with the exception of these symptoms as listed below:  Review of Systems  Neurological:       Reports she is here to discuss h/a and dizziness. She sts h/a are occurring everyday and will typically last all day. Describes the pain as a dull achy pain at the top of her head.  Reports she has struggled with dizziness in the past but is treated with meclizine and has benefited  well from it.     Objective:  Neurological Exam  Physical Exam Physical Examination:   Vitals:   06/06/19 1348  Temp: (!) 97.2 F (36.2 C)    General Examination: The patient is a very pleasant 84 y.o. female in no acute distress. She appears well-developed and well-nourished and well groomed. She denies orthostatic lightheadedness or vertiginous symptoms.  On blood pressure testing sitting and standing she has no significant drop with orthostasis.   HEENT: Normocephalic, atraumatic, pupils are equal, round and reactive to light and accommodation. Funduscopic exam is normal with sharp disc margins noted. She is status post bilateral cataract repairs. Extraocular tracking is good without limitation to gaze excursion or nystagmus noted. Normal smooth pursuit is noted. Hearing is grossly intact. Face is symmetric with normal facial animation and normal facial sensation. Speech is clear with no dysarthria noted. There is no hypophonia. There is no lip, neck/head, jaw or voice tremor. Neck is supple with full range of passive and active motion. There are  no carotid bruits on auscultation. Oropharynx exam reveals: mild mouth dryness, adequate dental hygiene and mild airway crowding, due to smaller airway. Tongue protrudes centrally and palate elevates symmetrically. Mild bony prominence at the base of her skull on the left side, feels like a normal unevenness in the bone.  She has no temporal tenderness, no palpable cord.  Chest: Clear to auscultation without wheezing, rhonchi or crackles noted.  Heart: S1+S2+0, regular and normal without murmurs, rubs or gallops noted.   Abdomen: Soft, non-tender and non-distended with normal bowel sounds appreciated on auscultation.  Extremities: There is no pitting edema in the distal lower extremities bilaterally. Pedal pulses are intact.  Skin: Warm and dry without trophic changes noted.  Musculoskeletal: exam reveals no obvious joint deformities, tenderness or joint swelling or erythema.   Neurologically:  Mental status: The patient is awake, alert and oriented in all 4 spheres. Her immediate and remote memory, attention, language skills and fund of knowledge are appropriate. There is no evidence of aphasia, agnosia, apraxia or anomia. Speech is clear with normal prosody and enunciation. Thought process is linear. Mood is normal and affect is normal.  Cranial nerves II - XII are as described above under HEENT exam. In addition: shoulder shrug is normal with equal shoulder height noted. Motor exam: Normal bulk, strength and tone is noted. There is no drift, tremor or rebound. Reflexes are 2+ throughout. Babinski: Toes are flexor bilaterally. Fine motor skills and coordination: intact with normal finger taps, normal hand movements, normal rapid alternating patting, normal foot taps and normal foot agility.  Cerebellar testing: No dysmetria or intention tremor on finger to nose testing. Heel to shin is unremarkable bilaterally. There is no truncal or gait ataxia.  Sensory exam: intact to light touch, vibration,  temperature sense in the upper and lower extremities.  Gait, station and balance: She stands easily. No veering to one side is noted. No leaning to one side is noted. Posture is age-appropriate and stance is narrow based. Gait  shows normal stride length and normal pace. No problems turning are noted.   Assessment and Plan:   In summary, [place] Lasseter is a very pleasant 84 y.o.-year old female with an underlying medical history of hypertension, breast cancer with status post left mastectomy in 1986, asthma, arthritis, reflux disease, aortic regurgitation, and osteoporosis, who Presents for evaluation of her recurrent headaches on the left side.  She does not have a prior history of migraines and headache description is not migrainous.  She has a largely nonfocal neurological examination.  She had a complaint of dizziness in the recent past which improved after she was taken off her chlorthalidone she reports.  She is largely reassured today.  Nevertheless, given her history of breast cancer I would like to exclude a structural cause of her symptoms and proceed with a brain MRI with and without contrast.  We can certainly consider medication for headache prevention such as amitriptyline and low-dose or gabapentin in low dose but we should be cautious given her age.  She is agreeable to waiting out the brain scan first and considering medication in the near future. I plan to follow her soon, we will call her with her brain MRI results as well. We talked about headache triggers.  Her history is not suggestive of any underlying sleep disordered breathing. I answered all her questions today and she was in agreement with the plan. Thank you very much for allowing me to participate in the care of this nice patient. If I can be of any further assistance to you please do not hesitate to call me at [phone].  Sincerely,   Star Age, MD, PhD

## 2019-06-07 ENCOUNTER — Telehealth: Payer: Self-pay | Admitting: Neurology

## 2019-06-07 LAB — COMPREHENSIVE METABOLIC PANEL
ALT: 9 IU/L (ref 0–32)
AST: 16 IU/L (ref 0–40)
Albumin/Globulin Ratio: 1.1 — ABNORMAL LOW (ref 1.2–2.2)
Albumin: 3.9 g/dL (ref 3.6–4.6)
Alkaline Phosphatase: 51 IU/L (ref 39–117)
BUN/Creatinine Ratio: 10 — ABNORMAL LOW (ref 12–28)
BUN: 13 mg/dL (ref 8–27)
Bilirubin Total: 0.3 mg/dL (ref 0.0–1.2)
CO2: 24 mmol/L (ref 20–29)
Calcium: 9.3 mg/dL (ref 8.7–10.3)
Chloride: 106 mmol/L (ref 96–106)
Creatinine, Ser: 1.31 mg/dL — ABNORMAL HIGH (ref 0.57–1.00)
GFR calc Af Amer: 44 mL/min/{1.73_m2} — ABNORMAL LOW (ref >59)
GFR calc non Af Amer: 38 mL/min/{1.73_m2} — ABNORMAL LOW (ref >59)
Globulin, Total: 3.7 g/dL (ref 1.5–4.5)
Glucose: 86 mg/dL (ref 65–99)
Potassium: 4.4 mmol/L (ref 3.5–5.2)
Sodium: 142 mmol/L (ref 134–144)
Total Protein: 7.6 g/dL (ref 6.0–8.5)

## 2019-06-07 NOTE — Telephone Encounter (Signed)
Medicare/aarp order sent to GI. No auth they will reach out to the patient to schedule.  

## 2019-06-07 NOTE — Addendum Note (Signed)
Addended by: Star Age on: 06/07/2019 04:58 PM   Modules accepted: Orders

## 2019-06-07 NOTE — Progress Notes (Signed)
Please advise patient that her kidney function is impaired, numbers are stable compared to August 2020 but I do not believe she has good enough kidney function to have a brain MRI with and without contrast.  To that end, I have changed the order to a brain MRI without contrast only.

## 2019-06-08 ENCOUNTER — Ambulatory Visit: Payer: Medicare Other | Admitting: Nurse Practitioner

## 2019-06-08 ENCOUNTER — Telehealth: Payer: Self-pay | Admitting: Neurology

## 2019-06-08 ENCOUNTER — Telehealth: Payer: Self-pay

## 2019-06-08 NOTE — Telephone Encounter (Signed)
I reached out to the pt and advised of lab results via vm( ok per dpr).  Pt was advised I was working from home and it would be to call back on 06/12/2019 if she had any questions due to our office possibly being closed for te remainder of the work week due to inclement weather.

## 2019-06-08 NOTE — Telephone Encounter (Signed)
-----   Message from Star Age, MD sent at 06/07/2019  4:59 PM EST ----- Please advise patient that her kidney function is impaired, numbers are stable compared to August 2020 but I do not believe she has good enough kidney function to have a brain MRI with and without contrast.  To that end, I have changed the order to a brain MRI without contrast only.

## 2019-06-08 NOTE — Telephone Encounter (Signed)
Medicare/aarp no auth patient is scheduled at GI for 06/29/19.

## 2019-06-22 ENCOUNTER — Other Ambulatory Visit: Payer: Self-pay | Admitting: Obstetrics and Gynecology

## 2019-06-22 DIAGNOSIS — Z1231 Encounter for screening mammogram for malignant neoplasm of breast: Secondary | ICD-10-CM

## 2019-06-26 ENCOUNTER — Other Ambulatory Visit: Payer: Self-pay

## 2019-06-26 ENCOUNTER — Encounter: Payer: Self-pay | Admitting: Cardiology

## 2019-06-26 ENCOUNTER — Ambulatory Visit: Payer: Medicare Other | Admitting: Cardiology

## 2019-06-26 VITALS — BP 120/61 | HR 57 | Temp 97.2°F | Resp 16 | Ht 61.5 in | Wt 160.0 lb

## 2019-06-26 DIAGNOSIS — I351 Nonrheumatic aortic (valve) insufficiency: Secondary | ICD-10-CM

## 2019-06-26 DIAGNOSIS — I340 Nonrheumatic mitral (valve) insufficiency: Secondary | ICD-10-CM

## 2019-06-26 DIAGNOSIS — I10 Essential (primary) hypertension: Secondary | ICD-10-CM | POA: Diagnosis not present

## 2019-06-26 NOTE — Progress Notes (Signed)
Subjective:   Joy Cain, female    DOB: Sep 02, 1935, 84 y.o.   MRN: [id number]  Chief complaint:  Follow-up on echocardiogram  HPI   84 year old African-American female with a medical history significant for hypertension, moderate aortic regurgitation, GERD, prior negative stress test 04/2018 who presents to the office with a chief complaint of follow-up echocardiogram.  Patient is unaccompanied today's office visit.  Since last office visit patient denies any hospitalizations or urgent care visits.  Patient remains asymptomatic from a cardiovascular standpoint.  She denies any chest pain at rest or with effort related activities.  She also refuses shortness of breath at rest or with effort related activities.  Patient states that her blood pressure is very well controlled compared to prior.  Her systolic blood pressure usually ranges between 291-916 mmHg and diastolic blood pressure between 60-70 mmHg and a heart rate around 60 bpm.  She was started on chlorthalidone back in the summer 2020 she discontinued it secondary to low blood pressures under the direction of her primary care provider.  Recent echocardiogram in January 2021, unchanged grade III aortic regurgitation, moderate mitral regurgitation, mild tricuspid regurgitation.  The findings were reviewed with the patient in great detail at today's office visit.   Current Outpatient Medications on File Prior to Visit  Medication Sig Dispense Refill  . amLODipine (NORVASC) 5 MG tablet Take 1 tablet (5 mg total) by mouth daily. 90 tablet 1  . Calcium Carb-Cholecalciferol (CALCIUM 500+D PO) Take 2 tablets by mouth daily.    Marland Kitchen docusate sodium (COLACE) 100 MG capsule Take 1 capsule (100 mg total) by mouth daily as needed. 30 capsule 1  . dorzolamide-timolol (COSOPT) 22.3-6.8 MG/ML ophthalmic solution Apply to eye.    . TURMERIC PO Take by mouth.    . vitamin B-12 (CYANOCOBALAMIN) 1000 MCG tablet Take 1,000 mcg by mouth daily.    .  vitamin C (ASCORBIC ACID) 500 MG tablet Take 500 mg by mouth daily.     No current facility-administered medications on file prior to visit.    Cardiovascular studies:  Echocardiogram 05/18/2019:  Left ventricle cavity is normal in size and wall thickness. Normal LV systolic function with EF 69%. Normal global wall motion. Doppler evidence of grade I (impaired) diastolic dysfunction, normal LAP. Calculated EF 69%.  Left atrial cavity is mildly dilated. Trileaflet aortic valve. Mild aortic valve leaflet thickening. Moderate (Grade III) aortic regurgitation. Moderate (Grade II) mitral regurgitation. Mild tricuspid regurgitation. Estimated pulmonary artery systolic pressure  27 mmHg. No significant change compared to previous study in 2019.   Lexiscan myoview stress test 01/25/2017: 1. Low risk of hemodynamically significant coronary artery disease. Prognostically, this is a low - risk study. 2. Resting NSR, Stress, non diagnostic due to pharmacologic stress. Symptoms included chest pain. 3. Normal SPECT study with normal left ventricular systolic function. Low risk study.  Recent labs: 06/06/2019: Glucose 86, BUN/Cr 13/1.31. EGFR 38. Na/K 142/4.4. Rest of the CMP normal  11/2018: TSH 2.2 normal  05/02/2018: H/H 13/38. MCV 84. Platelets 192  2016: HbA1C 5.7% Chol 176, TG 41, HDL 45, LDL 123   Review of Systems  Cardiovascular: Negative for chest pain, dyspnea on exertion, leg swelling, palpitations and syncope.  Respiratory: Negative for cough and shortness of breath.    Today's Vitals   06/26/19 1023  BP: 120/61  Pulse: (!) 57  Resp: 16  Temp: (!) 97.2 F (36.2 C)  TempSrc: Temporal  SpO2: 97%  Weight: 160 lb (72.6 kg)  Height: 5' 1.5" (1.562 m)   Body mass index is 29.74 kg/m.   Physical Exam  Constitutional: She is oriented to person, place, and time. She appears well-developed and well-nourished. No distress.  Cardiovascular: Normal rate, regular rhythm, S1 normal,  S2 normal and normal heart sounds. Exam reveals no gallop and no friction rub.  3+ diastolic murmur heard at the left sternal border.  3+ holosystolic murmur heard at the apex.  Pulmonary/Chest: Effort normal.  Neurological: She is alert and oriented to person, place, and time.  Psychiatric: She has a normal mood and affect.  Nursing note and vitals reviewed.  Assessment & Recommendations:   84 year old African-American female with a medical history significant for hypertension, moderate aortic regurgitation, moderate mitral regurgitation, GERD, prior negative stress test 04/2018.  1. Essential hypertension . Office blood pressure is at goal.  . Medication reconciled.  . If the blood pressure is consistently greater than 121mHg patient is asked to call the office to for medication titration sooner than the next office visit.  . Low salt diet recommended. A diet that is rich in fruits, vegetables, legumes, and low-fat dairy products and low in snacks, sweets, and meats (such as the Dietary Approaches to Stop Hypertension [DASH] diet).   2. Moderate aortic regurgitation Asymptomatic.  Patient currently has stage B valvular heart disease with moderate aortic regurgitation and moderate mitral regurgitation.  Patient LV function is preserved.   Most recent echocardiogram reviewed with the patient in great detail.  3.  Moderate mitral regurgitation: Stable. Stage III valvular heart disease continue to monitor symptoms.  Evaluation and management (453m) with time spent obtaining history, performing exam, reviewing labs, studies, outside records, greater than 50% of that time was spent in counseling and coordination care with the patient regarding complex decision making and discussion as state above, and documenting clinical evaluation.  Follow-up in 6 months or sooner if needed with Dr. PaVirgina Jock SuRex KrasDO, FASanta Rosaardiovascular. PAWheatley Heightsffice: [phone]

## 2019-06-29 ENCOUNTER — Ambulatory Visit
Admission: RE | Admit: 2019-06-29 | Discharge: 2019-06-29 | Disposition: A | Discharge status: Home / Self Care | Payer: Medicare Other | Source: Ambulatory Visit | Attending: Neurology | Admitting: Neurology

## 2019-06-29 DIAGNOSIS — R519 Headache, unspecified: Secondary | ICD-10-CM

## 2019-06-29 DIAGNOSIS — G4489 Other headache syndrome: Secondary | ICD-10-CM | POA: Diagnosis not present

## 2019-06-29 DIAGNOSIS — Z853 Personal history of malignant neoplasm of breast: Secondary | ICD-10-CM

## 2019-07-03 ENCOUNTER — Telehealth: Payer: Self-pay

## 2019-07-03 NOTE — Telephone Encounter (Signed)
-----   Message from Star Age, MD sent at 07/03/2019  8:11 AM EDT ----- Please call patient regarding the recent brain MRI: The brain scan showed a normal structure of the brain and no significant volume loss which we call atrophy. There were changes in the deeper structures of the brain, which we call white matter changes or microvascular changes. These were reported as moderate in Her case. These are tiny white spots, that occur with time and are seen in a variety of conditions, including with normal aging, chronic hypertension, chronic headaches, especially migraine HAs, chronic diabetes, chronic hyperlipidemia. These are not strokes and no mass or lesion or contrast enhancement was seen which is reassuring. Again, there were no acute findings, such as a stroke, or mass or blood products; no obvious cause for headaches.  No further action is required on this test at this time, other than re-enforcing the importance of good blood pressure control, good cholesterol control, good blood sugar control, and weight management. Please remind patient to keep any upcoming appointments or tests and to call us with any interim questions, concerns, problems or updates. Thanks,  Star Age, MD, PhD

## 2019-07-03 NOTE — Telephone Encounter (Signed)
I called pt. No answer, left a message asking her to call me back.

## 2019-07-03 NOTE — Progress Notes (Signed)
Please call patient regarding the recent brain MRI: The brain scan showed a normal structure of the brain and no significant volume loss which we call atrophy. There were changes in the deeper structures of the brain, which we call white matter changes or microvascular changes. These were reported as moderate in Her case. These are tiny white spots, that occur with time and are seen in a variety of conditions, including with normal aging, chronic hypertension, chronic headaches, especially migraine HAs, chronic diabetes, chronic hyperlipidemia. These are not strokes and no mass or lesion or contrast enhancement was seen which is reassuring. Again, there were no acute findings, such as a stroke, or mass or blood products; no obvious cause for headaches.  No further action is required on this test at this time, other than re-enforcing the importance of good blood pressure control, good cholesterol control, good blood sugar control, and weight management. Please remind patient to keep any upcoming appointments or tests and to call us with any interim questions, concerns, problems or updates. Thanks,  Star Age, MD, PhD

## 2019-07-04 NOTE — Telephone Encounter (Signed)
I called pt and discussed her MRI results and recommendations. I reminded pt of her appt in May. Pt verbalized understanding of results. Pt had no questions at this time but was encouraged to call back if questions arise.

## 2019-07-04 NOTE — Telephone Encounter (Signed)
Pt called back in regards to missed call requested cb to mobile#

## 2019-07-06 ENCOUNTER — Other Ambulatory Visit: Payer: Self-pay

## 2019-07-06 ENCOUNTER — Ambulatory Visit
Admission: RE | Admit: 2019-07-06 | Discharge: 2019-07-06 | Disposition: A | Discharge status: Home / Self Care | Payer: Medicare Other | Source: Ambulatory Visit | Attending: Obstetrics and Gynecology | Admitting: Obstetrics and Gynecology

## 2019-07-06 DIAGNOSIS — Z1231 Encounter for screening mammogram for malignant neoplasm of breast: Secondary | ICD-10-CM

## 2019-07-11 ENCOUNTER — Ambulatory Visit: Payer: Medicare Other | Admitting: Neurology

## 2019-08-08 ENCOUNTER — Encounter: Payer: Self-pay | Admitting: Nurse Practitioner

## 2019-08-08 ENCOUNTER — Other Ambulatory Visit: Payer: Self-pay

## 2019-08-08 ENCOUNTER — Ambulatory Visit (INDEPENDENT_AMBULATORY_CARE_PROVIDER_SITE_OTHER): Payer: Medicare Other | Admitting: Nurse Practitioner

## 2019-08-08 VITALS — BP 112/80 | HR 70 | Temp 98.2°F | Ht 60.6 in | Wt 148.6 lb

## 2019-08-08 DIAGNOSIS — G8929 Other chronic pain: Secondary | ICD-10-CM

## 2019-08-08 DIAGNOSIS — I10 Essential (primary) hypertension: Secondary | ICD-10-CM

## 2019-08-08 DIAGNOSIS — R519 Headache, unspecified: Secondary | ICD-10-CM | POA: Diagnosis not present

## 2019-08-08 MED ORDER — MAGNESIUM 200 MG PO TABS: ORAL_TABLET | ORAL | 2 refills | Status: DC

## 2019-08-08 NOTE — Progress Notes (Signed)
This visit occurred during the SARS-CoV-2 public health emergency.  Safety protocols were in place, including screening questions prior to the visit, additional usage of staff PPE, and extensive cleaning of exam room while observing appropriate contact time as indicated for disinfecting solutions.  Subjective:     Patient ID: Joy Cain , female    DOB: 03/30/1936 , 84 y.o.   MRN: [id number]   Chief Complaint  Patient presents with  . Hypertension    HPI  Hypertension This is a chronic problem. The current episode started more than 1 year ago. The problem has been waxing and waning since onset. The problem is controlled. Pertinent negatives include no anxiety. There are no associated agents to hypertension. Risk factors for coronary artery disease include sedentary lifestyle. Past treatments include calcium channel blockers. The current treatment provides significant improvement. There are no compliance problems.  There is no history of chronic renal disease.     Past Medical History:  Diagnosis Date  . Arthritis   . Asthma   . Atypical chest pain   . Breast cancer ([place])   . Dysphagia   . Dysphagia   . GERD (gastroesophageal reflux disease)   . Hypertension   . Moderate aortic regurgitation   . Osteoporosis      Family History  Problem Relation Age of Onset  . Heart disease Mother        d.95  . Stroke Father        d.60  . Breast cancer Sister 57       d.70s from breast cancer  . Prostate cancer Brother        d.40s from prostate cancer  . Cancer Maternal Uncle        d.80s from unspecified form of cancer  . Breast cancer Sister 55  . Cancer Brother        d.80s from unspecified form of cancer  . Cancer Other        d.38s from unspecified form of cancer. This is the daughter of an unaffected brother.  . Colon cancer Neg Hx   . Esophageal cancer Neg Hx   . Stomach cancer Neg Hx   . Rectal cancer Neg Hx      Current Outpatient Medications:  .  amLODipine  (NORVASC) 5 MG tablet, Take 1 tablet (5 mg total) by mouth daily., Disp: 90 tablet, Rfl: 1 .  Calcium Carb-Cholecalciferol (CALCIUM 500+D PO), Take 2 tablets by mouth daily., Disp: , Rfl:  .  docusate sodium (COLACE) 100 MG capsule, Take 1 capsule (100 mg total) by mouth daily as needed., Disp: 30 capsule, Rfl: 1 .  TURMERIC PO, Take by mouth., Disp: , Rfl:  .  vitamin B-12 (CYANOCOBALAMIN) 1000 MCG tablet, Take 1,000 mcg by mouth daily., Disp: , Rfl:  .  vitamin C (ASCORBIC ACID) 500 MG tablet, Take 500 mg by mouth daily., Disp: , Rfl:    Allergies  Allergen Reactions  . Aspirin Other (See Comments)    GI TRACT UPSET     Review of Systems   Today's Vitals   08/08/19 1038  BP: 112/80  Pulse: 70  Temp: 98.2 F (36.8 C)  TempSrc: Oral  Weight: 148 lb 9.6 oz (67.4 kg)  Height: 5' 0.6" (1.539 m)  PainSc: 0-No pain   Body mass index is 28.45 kg/m.   Objective:  Physical Exam Constitutional:      General: She is not in acute distress.    Appearance: Normal appearance.  Cardiovascular:  Rate and Rhythm: Normal rate and regular rhythm.     Pulses: Normal pulses.     Heart sounds: Normal heart sounds. No murmur.  Pulmonary:     Effort: Pulmonary effort is normal. No respiratory distress.     Breath sounds: Normal breath sounds.  Skin:    General: Skin is warm and dry.  Neurological:     General: No focal deficit present.     Mental Status: She is alert and oriented to person, place, and time.  Psychiatric:        Mood and Affect: Mood normal.        Thought Content: Thought content normal.        Judgment: Judgment normal.         Assessment And Plan:     1. Chronic nonintractable headache, unspecified headache type  She is being followed by Neurology and her CT scan of head was normal.  She is to take magnesium with evening meals to see if this is effective to help relieve her intermittent headaches  Encouraged to stay well hydrated - Magnesium 200 MG TABS;  Take 1 tablet by mouth daily with evening meal  Dispense: 30 tablet; Refill: 2  2. Essential hypertension  Chronic, excellent control  Continue with current medications Minette Brine, FNP    THE PATIENT IS ENCOURAGED TO PRACTICE SOCIAL DISTANCING DUE TO THE COVID-19 PANDEMIC.

## 2019-08-13 ENCOUNTER — Encounter: Payer: Self-pay | Admitting: Nurse Practitioner

## 2019-09-12 ENCOUNTER — Other Ambulatory Visit: Payer: Self-pay

## 2019-09-12 ENCOUNTER — Ambulatory Visit (INDEPENDENT_AMBULATORY_CARE_PROVIDER_SITE_OTHER): Payer: Medicare Other | Admitting: Neurology

## 2019-09-12 ENCOUNTER — Encounter: Payer: Self-pay | Admitting: Neurology

## 2019-09-12 VITALS — BP 99/62 | HR 66 | Ht 61.5 in | Wt 148.3 lb

## 2019-09-12 DIAGNOSIS — R519 Headache, unspecified: Secondary | ICD-10-CM

## 2019-09-12 NOTE — Patient Instructions (Addendum)
As discussed, your recent brain MRI without contrast did not show any obvious abnormality.  You have mildly impaired kidney function, your kidney function numbers were stable when we checked them in February.  Please continue with routine follow-up with your primary care nurse practitioner, Minette Brine.  I understand that you are not keen on taking any daily medication for headache prevention.  If you change your mind, we can consider a medication called amitriptyline.  Please continue to hydrate well with water, 6 to 8 cups/day are recommended, 8 ounces each.  Please try to rest well enough, 7 to 8 hours of sleep are typically recommended for the average adult.   If you continue to have ringing in the ear on the left side and your hearing is affected, you may benefit from seeing an ear, nose and throat specialist.  You can talk to your primary care about a referral if need be.  I would be happy to see you back as needed.

## 2019-09-12 NOTE — Progress Notes (Signed)
Subjective:    Patient ID: Joy Cain is a 84 y.o. female.  HPI     Interim history:   Joy Cain is an 84 year old right-handed woman with an underlying medical history of hypertension, breast cancer with status post left mastectomy in 1986, asthma, arthritis, reflux disease, aortic regurgitation, CKD, and osteoporosis, who Presents for follow-up consultation of her recurrent headaches.  The patient is unaccompanied today.  I first met her on 06/06/2019 at the request of her primary care nurse practitioner, at which time she reported an approximately 2-year history of recurrent left-sided headaches.  Her exam was benign.  She was not keen on taking daily prevention.  She reported nearly daily headaches.  I suggested we proceed with a brain MRI with and without contrast but due to impaired kidney function we were only able to do a noncontrasted MRI brain.  She had a brain MRI wo contrast on 06/29/19 and I reviewed the results: IMPRESSION: This MRI of the brain without contrast shows the following: 1.    Multiple single and confluent T2/FLAIR hyperintense foci in the hemispheres consistent with moderate chronic microvascular ischemic changes. 2.    Brain volume is normal for age 62.    There were no acute findings.  We called her with the test results.    Today, 09/12/2019: She reports doing about the same, no new symptoms, no sudden onset of one-sided weakness or numbness or tingling or droopy face or slurring of speech, no falls.  She has recurrent left-sided headaches, not severe, no photophobia typically, no nausea or vomiting.  She was particularly worried that there could be something in the brain that was causing the symptoms but knows that we did the MRI without contrast because of mildly impaired kidney function.  She reports that she has a routine follow-up pending with her primary care nurse practitioner in August.  She has never seen a nephrologist.  She tries to hydrate well and limits  her caffeine.  She has a longer standing history of left-sided tinnitus.  She saw an ENT some years ago as I understand.  She feels that sometimes her hearing is muffled but she was never recommended to have a hearing aid.  The patient's allergies, current medications, family history, past medical history, past social history, past surgical history and problem list were reviewed and updated as appropriate.   Previously:   03/22/19: (She) reports recurrent left-sided parietal headaches for the past 2 years.  Symptoms are nearly daily, she does not have associated nausea, vomiting, photophobia, sonophobia.  Headache description is constant and dull, achy, pressure-like, gets worse when she tries to lie down, better when she puts a hand under the right side of her head and lies on her right side to sleep.  She denies any snoring or nocturnal headaches or morning headaches, she does not wake up gasping and has not been told that she has pauses in her breathing, feels that she sleeps well.  She has no prior history of recurrent headaches or migraines growing up.  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  She has 1 daughter who lives in [place] and 2 sons.  She has 7 grandchildren, she is a non-smoker does not utilize any alcohol, drinks limited caffeine in the form of coffee, typically 1 cup/day in the mornings, tries to hydrate well with water.  She denies any sudden onset of one-sided weakness or numbness or tingling or droopy face or  slurring of speech, headache severity is mild to moderate, Tylenol does not help, she currently does not take anything for it.  She has had intermittent dizziness for the past several months, she feels better.  Meclizine helped to some degree but stopping the chlorthalidone some 2 or 3 months ago helped significantly. I reviewed your office note from 04/04/2019.  She had a head CT without contrast on 04/11/2019 and I reviewed the results:  IMPRESSION: Negative CT of the brain. She had blood work through your office in August 2020 and ESR was normal at the time. She had an eye examination in the recent past, she had cataract surgeries last year.   Her Past Medical History Is Significant For: Past Medical History:  Diagnosis Date  . Arthritis   . Asthma   . Atypical chest pain   . Breast cancer ([place])   . Dysphagia   . Dysphagia   . GERD (gastroesophageal reflux disease)   . Hypertension   . Moderate aortic regurgitation   . Osteoporosis     Her Past Surgical History Is Significant For: Past Surgical History:  Procedure Laterality Date  . BUNIONECTOMY    . CHOLECYSTECTOMY  09/2015  . HAMMER TOE SURGERY    . MASTECTOMY    . MASTECTOMY Left 1987    Her Family History Is Significant For: Family History  Problem Relation Age of Onset  . Heart disease Mother        d.95  . Stroke Father        d.60  . Breast cancer Sister 68       d.70s from breast cancer  . Prostate cancer Brother        d.40s from prostate cancer  . Cancer Maternal Uncle        d.80s from unspecified form of cancer  . Breast cancer Sister 13  . Cancer Brother        d.80s from unspecified form of cancer  . Cancer Other        d.38s from unspecified form of cancer. This is the daughter of an unaffected brother.  . Colon cancer Neg Hx   . Esophageal cancer Neg Hx   . Stomach cancer Neg Hx   . Rectal cancer Neg Hx     Her Social History Is Significant For: Social History   Socioeconomic History  . Marital status: Married    Spouse name: Not on file  . Number of children: 3  . Years of education: Not on file  . Highest education level: Not on file  Occupational History  . Occupation: retired  Tobacco Use  . Smoking status: Never Smoker  . Smokeless tobacco: Never Used  Substance and Sexual Activity  . Alcohol use: No    Alcohol/week: 0.0 standard drinks  . Drug use: No  . Sexual activity: Yes  Other Topics Concern  . Not  on file  Social History Narrative   Lives with husband in a 2 story home.  Has 3 children.    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     Social Determinants of Health   Financial Resource Strain: Low Risk   . Difficulty of Paying Living Expenses: Not hard at all  Food Insecurity: No Food Insecurity  . Worried About Charity fundraiser in the Last Year: Never true  . Ran Out of Food in the Last Year: Never true  Transportation Needs: No Transportation Needs  . Lack of Transportation (Medical): No  .  Lack of Transportation (Non-Medical): No  Physical Activity: Inactive  . Days of Exercise per Week: 0 days  . Minutes of Exercise per Session: 0 min  Stress: No Stress Concern Present  . Feeling of Stress : Not at all  Social Connections:   . Frequency of Communication with Friends and Family:   . Frequency of Social Gatherings with Friends and Family:   . Attends Religious Services:   . Active Member of Clubs or Organizations:   . Attends Archivist Meetings:   Marland Kitchen Marital Status:     Her Allergies Are:  Allergies  Allergen Reactions  . Aspirin Other (See Comments)    GI TRACT UPSET  :   Her Current Medications Are:  Outpatient Encounter Medications as of 09/12/2019  Medication Sig  . amLODipine (NORVASC) 5 MG tablet Take 1 tablet (5 mg total) by mouth daily.  . Cholecalciferol 25 MCG (1000 UT) capsule Take by mouth.  . clotrimazole (MYCELEX) 10 MG troche Take by mouth.  . docusate sodium (COLACE) 100 MG capsule Take 1 capsule (100 mg total) by mouth daily as needed.  . dorzolamide-timolol (COSOPT) 22.3-6.8 MG/ML ophthalmic solution Apply to eye.  . TURMERIC PO Take by mouth.  . vitamin B-12 (CYANOCOBALAMIN) 1000 MCG tablet Take 1,000 mcg by mouth daily.  . vitamin C (ASCORBIC ACID) 500 MG tablet Take 500 mg by mouth daily.  . [DISCONTINUED] Calcium Carb-Cholecalciferol (CALCIUM 500+D PO) Take 2 tablets by mouth daily.  . [DISCONTINUED] Magnesium 200 MG TABS  Take 1 tablet by mouth daily with evening meal   No facility-administered encounter medications on file as of 09/12/2019.  :  Review of Systems:  Out of a complete 14 point review of systems, all are reviewed and negative with the exception of these symptoms as listed below:  Review of Systems  Neurological:       Here to f/u on h/a report she is doing the same since her last f/u.     Objective:  Neurological Exam  Physical Exam Physical Examination:   Vitals:   09/12/19 1259  BP: 99/62  Pulse: 66    General Examination: The patient is a very pleasant 84 y.o. female in no acute distress. She appears well-developed and well-nourished and well groomed.   HEENT: Normocephalic, atraumatic, pupils are equal, round and reactive to light, status post bilateral cataract repairs. Extraocular tracking is good without limitation to gaze excursion or nystagmus noted. Normal smooth pursuit is noted. Hearing is grossly intact. TM clear b/l. Face is symmetric with normal facial animation and normal facial sensation. Speech is clear with no dysarthria noted. There is no hypophonia. There is no lip, neck/head, jaw or voice tremor. Neck is supple with full range of passive and active motion. There are no carotid bruits on auscultation. Oropharynx exam reveals: mild mouth dryness, adequate dental hygiene and mild airway crowding. Tongue protrudes centrally and palate elevates symmetrically.   Chest: Clear to auscultation without wheezing, rhonchi or crackles noted.  Heart: S1+S2+0, regular and normal without murmurs, rubs or gallops noted.   Abdomen: Soft, non-tender and non-distended.  Extremities: There is no pitting edema in the distal lower extremities bilaterally.  Skin: Warm and dry without trophic changes noted.  Musculoskeletal: exam reveals no obvious joint deformities, tenderness or joint swelling or erythema.   Neurologically:  Mental status: The patient is awake, alert and  oriented in all 4 spheres. Her immediate and remote memory, attention, language skills and fund of knowledge are appropriate.  There is no evidence of aphasia, agnosia, apraxia or anomia. Speech is clear with normal prosody and enunciation. Thought process is linear. Mood is normal and affect is normal.  Cranial nerves II - XII are as described above under HEENT exam. In addition: shoulder shrug is normal with equal shoulder height noted. Motor exam: Normal bulk, strength and tone is noted. There is no drift, tremor or rebound. Fine motor skills and coordination: grossly intact.  Cerebellar testing: No dysmetria or intention tremor on finger to nose testing. Heel to shin is unremarkable bilaterally. There is no truncal or gait ataxia.  Sensory exam: intact to light touch.  Gait, station and balance: She stands easily. No veering to one side is noted. No leaning to one side is noted. Posture is age-appropriate and stance is narrow based. Gait shows normal stride length and normal pace. No problems turning are noted.   Assessment and Plan:   In summary, Joy Cain is a very pleasant 84 year old female with an underlying medical history of hypertension, breast cancer with status post left mastectomy in 1986, asthma, arthritis, reflux disease, aortic regurgitation, and osteoporosis, who Presents for follow-up consultation of her recurrent headaches on the left side.  She feels that these are different from her migraines and actually milder but nagging.  Exam is nonfocal, MRI did not show any obvious structural cause of her symptoms but we did the MRI without contrast because of mildly impaired kidney function.  She is reminded to keep a follow-up appointment with her primary care nurse practitioner to discuss kidney function.  Laboratory evaluation in February showed stable findings in that regard.  She is offered a medication for headache prevention, I suggested low-dose amitriptyline but she declined.  I  would recommend that she continue to try to hydrate well with water, try to rest well.  We talked about the importance of healthy lifestyle.  At this juncture, she is advised to follow-up with primary care and follow-up with me as needed.  She is agreeable to this.  She is encouraged to talk to her primary care about potentially seeing ENT because she reports ongoing issues with tinnitus and sometimes her hearing on the left side seems muffled.  I answered all her questions today and she was in agreement. I spent 30 minutes in total face-to-face time and in reviewing records during pre-charting, more than 50% of which was spent in counseling and coordination of care, reviewing test results, reviewing medications and treatment regimen and/or in discussing or reviewing the diagnosis of recurrent HAs, the prognosis and treatment options. Pertinent laboratory and imaging test results that were available during this visit with the patient were reviewed by me and considered in my medical decision making (see chart for details).

## 2019-09-27 DIAGNOSIS — H16143 Punctate keratitis, bilateral: Secondary | ICD-10-CM | POA: Diagnosis not present

## 2019-09-27 DIAGNOSIS — H401131 Primary open-angle glaucoma, bilateral, mild stage: Secondary | ICD-10-CM | POA: Diagnosis not present

## 2019-09-27 DIAGNOSIS — H2513 Age-related nuclear cataract, bilateral: Secondary | ICD-10-CM | POA: Diagnosis not present

## 2019-10-02 ENCOUNTER — Other Ambulatory Visit: Payer: Self-pay

## 2019-10-02 DIAGNOSIS — I10 Essential (primary) hypertension: Secondary | ICD-10-CM

## 2019-10-02 MED ORDER — AMLODIPINE BESYLATE 5 MG PO TABS: 5.0000 mg | ORAL_TABLET | Freq: Every day | ORAL | 1 refills | Status: DC

## 2019-11-02 DIAGNOSIS — H401131 Primary open-angle glaucoma, bilateral, mild stage: Secondary | ICD-10-CM | POA: Diagnosis not present

## 2019-12-06 ENCOUNTER — Encounter: Payer: Self-pay | Admitting: Nurse Practitioner

## 2019-12-06 ENCOUNTER — Ambulatory Visit (INDEPENDENT_AMBULATORY_CARE_PROVIDER_SITE_OTHER): Payer: Medicare Other

## 2019-12-06 ENCOUNTER — Other Ambulatory Visit: Payer: Self-pay

## 2019-12-06 ENCOUNTER — Ambulatory Visit (INDEPENDENT_AMBULATORY_CARE_PROVIDER_SITE_OTHER): Payer: Medicare Other | Admitting: Nurse Practitioner

## 2019-12-06 VITALS — BP 116/72 | HR 81 | Temp 98.4°F | Ht 60.4 in | Wt 141.0 lb

## 2019-12-06 VITALS — BP 116/72 | HR 81 | Temp 98.4°F | Ht 60.4 in | Wt 141.2 lb

## 2019-12-06 DIAGNOSIS — R519 Headache, unspecified: Secondary | ICD-10-CM | POA: Diagnosis not present

## 2019-12-06 DIAGNOSIS — I2510 Atherosclerotic heart disease of native coronary artery without angina pectoris: Secondary | ICD-10-CM | POA: Diagnosis not present

## 2019-12-06 DIAGNOSIS — G8929 Other chronic pain: Secondary | ICD-10-CM | POA: Diagnosis not present

## 2019-12-06 DIAGNOSIS — N1832 Chronic kidney disease, stage 3b: Secondary | ICD-10-CM

## 2019-12-06 DIAGNOSIS — Z23 Encounter for immunization: Secondary | ICD-10-CM | POA: Diagnosis not present

## 2019-12-06 DIAGNOSIS — Z0000 Encounter for general adult medical examination without abnormal findings: Secondary | ICD-10-CM | POA: Diagnosis not present

## 2019-12-06 DIAGNOSIS — I10 Essential (primary) hypertension: Secondary | ICD-10-CM | POA: Diagnosis not present

## 2019-12-06 MED ORDER — AMITRIPTYLINE HCL 10 MG PO TABS: 10.0000 mg | ORAL_TABLET | Freq: Every day | ORAL | 2 refills | Status: DC

## 2019-12-06 MED ORDER — BOOSTRIX 5-2.5-18.5 LF-MCG/0.5 IM SUSP: 0.5000 mL | Freq: Once | INTRAMUSCULAR | 0 refills | Status: AC

## 2019-12-06 NOTE — Patient Instructions (Signed)
Joy Cain , Thank you for taking time to come for your Medicare Wellness Visit. I appreciate your ongoing commitment to your health goals. Please review the following plan we discussed and let me know if I can assist you in the future.   Screening recommendations/referrals: Colonoscopy: not required Mammogram: completed 07/06/2019 Bone Density: completed 06/29/2017 Recommended yearly ophthalmology/optometry visit for glaucoma screening and checkup Recommended yearly dental visit for hygiene and checkup  Vaccinations: Influenza vaccine: due Pneumococcal vaccine: today Tdap vaccine: sent to pharmacy Shingles vaccine: discussed   Covid-19:will call with dates  Advanced directives: Advance directive discussed with you today. Even though you declined this today please call our office should you change your mind and we can give you the proper paperwork for you to fill out.   Conditions/risks identified: none  Next appointment: Follow up in one year for your annual wellness visit 12/11/2020 at 10:30   Preventive Care 82 Years and Older, Female Preventive care refers to lifestyle choices and visits with your health care provider that can promote health and wellness. What does preventive care include?  A yearly physical exam. This is also called an annual well check.  Dental exams once or twice a year.  Routine eye exams. Ask your health care provider how often you should have your eyes checked.  Personal lifestyle choices, including:  Daily care of your teeth and gums.  Regular physical activity.  Eating a healthy diet.  Avoiding tobacco and drug use.  Limiting alcohol use.  Practicing safe sex.  Taking low-dose aspirin every day.  Taking vitamin and mineral supplements as recommended by your health care provider. What happens during an annual well check? The services and screenings done by your health care provider during your annual well check will depend on your age,  overall health, lifestyle risk factors, and family history of disease. Counseling  Your health care provider may ask you questions about your:  Alcohol use.  Tobacco use.  Drug use.  Emotional well-being.  Home and relationship well-being.  Sexual activity.  Eating habits.  History of falls.  Memory and ability to understand (cognition).  Work and work Statistician.  Reproductive health. Screening  You may have the following tests or measurements:  Height, weight, and BMI.  Blood pressure.  Lipid and cholesterol levels. These may be checked every 5 years, or more frequently if you are over 74 years old.  Skin check.  Lung cancer screening. You may have this screening every year starting at age 82 if you have a 30-pack-year history of smoking and currently smoke or have quit within the past 15 years.  Fecal occult blood test (FOBT) of the stool. You may have this test every year starting at age 82.  Flexible sigmoidoscopy or colonoscopy. You may have a sigmoidoscopy every 5 years or a colonoscopy every 10 years starting at age 24.  Hepatitis C blood test.  Hepatitis B blood test.  Sexually transmitted disease (STD) testing.  Diabetes screening. This is done by checking your blood sugar (glucose) after you have not eaten for a while (fasting). You may have this done every 1-3 years.  Bone density scan. This is done to screen for osteoporosis. You may have this done starting at age 82.  Mammogram. This may be done every 1-2 years. Talk to your health care provider about how often you should have regular mammograms. Talk with your health care provider about your test results, treatment options, and if necessary, the need for more tests.  Vaccines  Your health care provider may recommend certain vaccines, such as:  Influenza vaccine. This is recommended every 82 years.  Tetanus, diphtheria, and acellular pertussis (Tdap, Td) vaccine. You may need a Td booster every 10  years.  Zoster vaccine. You may need this after age 82.  Pneumococcal 13-valent conjugate (PCV13) vaccine. One dose is recommended after age 82.  Pneumococcal polysaccharide (PPSV23) vaccine. One dose is recommended after age 82. Talk to your health care provider about which screenings and vaccines you need and how often you need them. This information is not intended to replace advice given to you by your health care provider. Make sure you discuss any questions you have with your health care provider. Document Released: 05/03/2015 Document Revised: 12/25/2015 Document Reviewed: 02/05/2015 Elsevier Interactive Patient Education  2017 [employer] Prevention in the Home Falls can cause injuries. They can happen to people of all ages. There are many things you can do to make your home safe and to help prevent falls. What can I do on the outside of my home?  Regularly fix the edges of walkways and driveways and fix any cracks.  Remove anything that might make you trip as you walk through a door, such as a raised step or threshold.  Trim any bushes or trees on the path to your home.  Use bright outdoor lighting.  Clear any walking paths of anything that might make someone trip, such as rocks or tools.  Regularly check to see if handrails are loose or broken. Make sure that both sides of any steps have handrails.  Any raised decks and porches should have guardrails on the edges.  Have any leaves, snow, or ice cleared regularly.  Use sand or salt on walking paths during winter.  Clean up any spills in your garage right away. This includes oil or grease spills. What can I do in the bathroom?  Use night lights.  Install grab bars by the toilet and in the tub and shower. Do not use towel bars as grab bars.  Use non-skid mats or decals in the tub or shower.  If you need to sit down in the shower, use a plastic, non-slip stool.  Keep the floor dry. Clean up any water that  spills on the floor as soon as it happens.  Remove soap buildup in the tub or shower regularly.  Attach bath mats securely with double-sided non-slip rug tape.  Do not have throw rugs and other things on the floor that can make you trip. What can I do in the bedroom?  Use night lights.  Make sure that you have a light by your bed that is easy to reach.  Do not use any sheets or blankets that are too big for your bed. They should not hang down onto the floor.  Have a firm chair that has side arms. You can use this for support while you get dressed.  Do not have throw rugs and other things on the floor that can make you trip. What can I do in the kitchen?  Clean up any spills right away.  Avoid walking on wet floors.  Keep items that you use a lot in easy-to-reach places.  If you need to reach something above you, use a strong step stool that has a grab bar.  Keep electrical cords out of the way.  Do not use floor polish or wax that makes floors slippery. If you must use wax, use non-skid floor wax.  Do not have throw rugs and other things on the floor that can make you trip. What can I do with my stairs?  Do not leave any items on the stairs.  Make sure that there are handrails on both sides of the stairs and use them. Fix handrails that are broken or loose. Make sure that handrails are as long as the stairways.  Check any carpeting to make sure that it is firmly attached to the stairs. Fix any carpet that is loose or worn.  Avoid having throw rugs at the top or bottom of the stairs. If you do have throw rugs, attach them to the floor with carpet tape.  Make sure that you have a light switch at the top of the stairs and the bottom of the stairs. If you do not have them, ask someone to add them for you. What else can I do to help prevent falls?  Wear shoes that:  Do not have high heels.  Have rubber bottoms.  Are comfortable and fit you well.  Are closed at the  toe. Do not wear sandals.  If you use a stepladder:  Make sure that it is fully opened. Do not climb a closed stepladder.  Make sure that both sides of the stepladder are locked into place.  Ask someone to hold it for you, if possible.  Clearly mark and make sure that you can see:  Any grab bars or handrails.  First and last steps.  Where the edge of each step is.  Use tools that help you move around (mobility aids) if they are needed. These include:  Canes.  Walkers.  Scooters.  Crutches.  Turn on the lights when you go into a dark area. Replace any light bulbs as soon as they burn out.  Set up your furniture so you have a clear path. Avoid moving your furniture around.  If any of your floors are uneven, fix them.  If there are any pets around you, be aware of where they are.  Review your medicines with your doctor. Some medicines can make you feel dizzy. This can increase your chance of falling. Ask your doctor what other things that you can do to help prevent falls. This information is not intended to replace advice given to you by your health care provider. Make sure you discuss any questions you have with your health care provider. Document Released: 01/31/2009 Document Revised: 09/12/2015 Document Reviewed: 05/11/2014 Elsevier Interactive Patient Education  2017 [employer].

## 2019-12-06 NOTE — Progress Notes (Signed)
I,Yamilka Roman [employer] as a Education administrator for [employer], FNP.,have documented all relevant documentation on the behalf of Minette Brine, FNP,as directed by  Minette Brine, FNP while in the presence of Minette Brine, [place]. This visit occurred during the SARS-CoV-2 public health emergency.  Safety protocols were in place, including screening questions prior to the visit, additional usage of staff PPE, and extensive cleaning of exam room while observing appropriate contact time as indicated for disinfecting solutions.  Subjective:     Patient ID: Joy Cain , female    DOB: 10-02-1935 , 84 y.o.   MRN: [id number]   Chief Complaint  Patient presents with  . Hypertension  . Headache    HPI  Hypertension This is a chronic problem. The current episode started more than 1 year ago. The problem is controlled. Associated symptoms include headaches. Pertinent negatives include no chest pain, palpitations or shortness of breath. Risk factors for coronary artery disease include sedentary lifestyle.  Headache  Pertinent negatives include no coughing, dizziness or weakness. Her past medical history is significant for hypertension.     Past Medical History:  Diagnosis Date  . Arthritis   . Asthma   . Atypical chest pain   . Breast cancer ([place])   . Dysphagia   . Dysphagia   . GERD (gastroesophageal reflux disease)   . Hypertension   . Moderate aortic regurgitation   . Osteoporosis      Family History  Problem Relation Age of Onset  . Heart disease Mother        d.95  . Stroke Father        d.60  . Breast cancer Sister 74       d.70s from breast cancer  . Prostate cancer Brother        d.40s from prostate cancer  . Cancer Maternal Uncle        d.80s from unspecified form of cancer  . Breast cancer Sister 39  . Cancer Brother        d.80s from unspecified form of cancer  . Cancer Other        d.38s from unspecified form of cancer. This is the daughter of an unaffected brother.  .  Colon cancer Neg Hx   . Esophageal cancer Neg Hx   . Stomach cancer Neg Hx   . Rectal cancer Neg Hx      Current Outpatient Medications:  .  amLODipine (NORVASC) 5 MG tablet, Take 1 tablet (5 mg total) by mouth daily., Disp: 90 tablet, Rfl: 1 .  Cholecalciferol 25 MCG (1000 UT) capsule, Take by mouth., Disp: , Rfl:  .  docusate sodium (COLACE) 100 MG capsule, Take 1 capsule (100 mg total) by mouth daily as needed., Disp: 30 capsule, Rfl: 1 .  dorzolamide-timolol (COSOPT) 22.3-6.8 MG/ML ophthalmic solution, Apply to eye., Disp: , Rfl:  .  TURMERIC PO, Take by mouth., Disp: , Rfl:  .  vitamin B-12 (CYANOCOBALAMIN) 1000 MCG tablet, Take 1,000 mcg by mouth daily., Disp: , Rfl:  .  vitamin C (ASCORBIC ACID) 500 MG tablet, Take 500 mg by mouth daily., Disp: , Rfl:  .  amitriptyline (ELAVIL) 10 MG tablet, Take 1 tablet (10 mg total) by mouth at bedtime., Disp: 30 tablet, Rfl: 2 .  clotrimazole (MYCELEX) 10 MG troche, Take by mouth., Disp: , Rfl:  .  Tdap (BOOSTRIX) 5-2.5-18.5 LF-MCG/0.5 injection, Inject 0.5 mLs into the muscle once for 1 dose., Disp: 0.5 mL, Rfl: 0   Allergies  Allergen  Reactions  . Aspirin Other (See Comments)    GI TRACT UPSET     Review of Systems  Constitutional: Negative.  Negative for fatigue.  Eyes: Negative for visual disturbance.  Respiratory: Negative.  Negative for cough and shortness of breath.   Cardiovascular: Positive for leg swelling (worse at night). Negative for chest pain and palpitations.  Gastrointestinal: Negative.   Endocrine: Negative.   Musculoskeletal: Negative.   Skin: Negative.   Neurological: Positive for headaches. Negative for dizziness and weakness.  Psychiatric/Behavioral: Negative for confusion. The patient is not nervous/anxious.      Today's Vitals   12/06/19 1018  BP: 116/72  Pulse: 81  Temp: 98.4 F (36.9 C)  TempSrc: Oral  Weight: 141 lb 3.2 oz (64 kg)  Height: 5' 0.4" (1.534 m)  PainSc: 5   PainLoc: Head   Body mass  index is 27.21 kg/m.   Objective:  Physical Exam Constitutional:      General: She is not in acute distress.    Appearance: She is well-developed.  Cardiovascular:     Rate and Rhythm: Normal rate and regular rhythm.     Heart sounds: Normal heart sounds. No murmur heard.   Pulmonary:     Effort: Pulmonary effort is normal. No respiratory distress.     Breath sounds: Normal breath sounds.  Musculoskeletal:        General: No swelling.  Neurological:     Mental Status: She is alert and oriented to person, place, and time.     Cranial Nerves: No cranial nerve deficit.  Psychiatric:        Mood and Affect: Mood normal. Mood is not anxious.        Behavior: Behavior normal.         Assessment And Plan:     1. Essential hypertension  Blood pressure is well controlled  Continue with current medications  Will check kidney functions - BMP8+eGFR  2. Stage 3b chronic kidney disease  Encouraged to stay well hydrated with water  3. Chronic nonintractable headache, unspecified headache type  Continues to have chronic daily headaches  She is willing to try the amitriptyline recommended by Neurology - amitriptyline (ELAVIL) 10 MG tablet; Take 1 tablet (10 mg total) by mouth at bedtime.  Dispense: 30 tablet; Refill: 2 - BMP8+eGFR  4. Atherosclerotic cardiovascular disease  Chronic, will check lipid panel and will consider starting a statin pending results - Lipid panel     Patient was given opportunity to ask questions. Patient verbalized understanding of the plan and was able to repeat key elements of the plan. All questions were answered to their satisfaction.  Minette Brine, FNP   I, Minette Brine, FNP, have reviewed all documentation for this visit. The documentation on 12/06/19 for the exam, diagnosis, procedures, and orders are all accurate and complete.  THE PATIENT IS ENCOURAGED TO PRACTICE SOCIAL DISTANCING DUE TO THE COVID-19 PANDEMIC.

## 2019-12-06 NOTE — Progress Notes (Signed)
This visit occurred during the SARS-CoV-2 public health emergency.  Safety protocols were in place, including screening questions prior to the visit, additional usage of staff PPE, and extensive cleaning of exam room while observing appropriate contact time as indicated for disinfecting solutions.  Subjective:   Joy Cain is a 84 y.o. female who presents for Medicare Annual (Subsequent) preventive examination.  Review of Systems     Cardiac Risk Factors include: advanced age (>75men, >65 women);hypertension     Objective:    Today's Vitals   12/06/19 1028  BP: 116/72  Pulse: 81  Temp: 98.4 F (36.9 C)  TempSrc: Oral  Weight: 141 lb (64 kg)  Height: 5' 0.4" (1.534 m)  PainSc: 5    Body mass index is 27.17 kg/m.  Advanced Directives 12/06/2019 12/01/2018 04/09/2015 02/21/2015 02/13/2014  Does Patient Have a Medical Advance Directive? No No No No No  Would patient like information on creating a medical advance directive? - No - Patient declined No - patient declined information - No - patient declined information    Current Medications (verified) Outpatient Encounter Medications as of 12/06/2019  Medication Sig  . amLODipine (NORVASC) 5 MG tablet Take 1 tablet (5 mg total) by mouth daily.  . Cholecalciferol 25 MCG (1000 UT) capsule Take by mouth.  . clotrimazole (MYCELEX) 10 MG troche Take by mouth.  . docusate sodium (COLACE) 100 MG capsule Take 1 capsule (100 mg total) by mouth daily as needed.  . dorzolamide-timolol (COSOPT) 22.3-6.8 MG/ML ophthalmic solution Apply to eye.  . TURMERIC PO Take by mouth.  . vitamin B-12 (CYANOCOBALAMIN) 1000 MCG tablet Take 1,000 mcg by mouth daily.  . vitamin C (ASCORBIC ACID) 500 MG tablet Take 500 mg by mouth daily.  . Tdap (BOOSTRIX) 5-2.5-18.5 LF-MCG/0.5 injection Inject 0.5 mLs into the muscle once for 1 dose.   No facility-administered encounter medications on file as of 12/06/2019.    Allergies (verified) Aspirin    History: Past Medical History:  Diagnosis Date  . Arthritis   . Asthma   . Atypical chest pain   . Breast cancer ([place])   . Dysphagia   . Dysphagia   . GERD (gastroesophageal reflux disease)   . Hypertension   . Moderate aortic regurgitation   . Osteoporosis    Past Surgical History:  Procedure Laterality Date  . BUNIONECTOMY    . CHOLECYSTECTOMY  09/2015  . HAMMER TOE SURGERY    . MASTECTOMY    . MASTECTOMY Left 1987   Family History  Problem Relation Age of Onset  . Heart disease Mother        d.95  . Stroke Father        d.60  . Breast cancer Sister 55       d.70s from breast cancer  . Prostate cancer Brother        d.40s from prostate cancer  . Cancer Maternal Uncle        d.80s from unspecified form of cancer  . Breast cancer Sister 37  . Cancer Brother        d.80s from unspecified form of cancer  . Cancer Other        d.38s from unspecified form of cancer. This is the daughter of an unaffected brother.  . Colon cancer Neg Hx   . Esophageal cancer Neg Hx   . Stomach cancer Neg Hx   . Rectal cancer Neg Hx    Social History   Socioeconomic History  . Marital status:  Married    Spouse name: Not on file  . Number of children: 3  . Years of education: Not on file  . Highest education level: Not on file  Occupational History  . Occupation: retired  Tobacco Use  . Smoking status: Never Smoker  . Smokeless tobacco: Never Used  Vaping Use  . Vaping Use: Never used  Substance and Sexual Activity  . Alcohol use: No    Alcohol/week: 0.0 standard drinks  . Drug use: No  . Sexual activity: Yes  Other Topics Concern  . Not on file  Social History Narrative   Lives with husband in a 2 story home.  Has 3 children.    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     Social Determinants of Health   Financial Resource Strain: Low Risk   . Difficulty of Paying Living Expenses: Not hard at all  Food Insecurity: No Food Insecurity  . Worried About Paediatric nurse in the Last Year: Never true  . Ran Out of Food in the Last Year: Never true  Transportation Needs: No Transportation Needs  . Lack of Transportation (Medical): No  . Lack of Transportation (Non-Medical): No  Physical Activity: Insufficiently Active  . Days of Exercise per Week: 3 days  . Minutes of Exercise per Session: 30 min  Stress: No Stress Concern Present  . Feeling of Stress : Not at all  Social Connections:   . Frequency of Communication with Friends and Family:   . Frequency of Social Gatherings with Friends and Family:   . Attends Religious Services:   . Active Member of Clubs or Organizations:   . Attends Archivist Meetings:   Marland Kitchen Marital Status:     Tobacco Counseling Counseling given: Not Answered   Clinical Intake:  Pre-visit preparation completed: Yes  Pain : 0-10 Pain Score: 5  Pain Type: Chronic pain Pain Location: Head Pain Descriptors / Indicators: Aching Pain Onset: More than a month ago Pain Frequency: Constant     Nutritional Status: BMI 25 -29 Overweight Nutritional Risks: None Diabetes: No  How often do you need to have someone help you when you read instructions, pamphlets, or other written materials from your doctor or pharmacy?: 1 - Never What is the last grade level you completed in school?: PhD  Diabetic? no  Interpreter Needed?: No  Information entered by :: NAllen LPN   Activities of Daily Living In your present state of health, do you have any difficulty performing the following activities: 12/06/2019 12/06/2019  Hearing? N N  Vision? N N  Difficulty concentrating or making decisions? N N  Walking or climbing stairs? N N  Dressing or bathing? N N  Doing errands, shopping? N N  Preparing Food and eating ? N -  Using the Toilet? N -  In the past six months, have you accidently leaked urine? N -  Do you have problems with loss of bowel control? N -  Managing your Medications? N -  Managing your Finances?  N -  Housekeeping or managing your Housekeeping? N -  Some recent data might be hidden    Patient Care Team: Minette Brine, FNP as PCP - General (General Practice)  Indicate any recent Medical Services you may have received from other than [facility] providers in the past year (date may be approximate).     Assessment:   This is a routine wellness examination for Joy Cain.  Hearing/Vision screen  Hearing Screening   125Hz   250Hz  500Hz  1000Hz  2000Hz  3000Hz  4000Hz  6000Hz  8000Hz   Right ear:           Left ear:           Vision Screening Comments: Regular eye exams, Dr. Fletcher Anon  Dietary issues and exercise activities discussed: Current Exercise Habits: Home exercise routine, Type of exercise: Other - see comments (stationary bike), Time (Minutes): 30, Frequency (Times/Week): 3, Weekly Exercise (Minutes/Week): 90  Goals    . Patient Stated     12/06/2019, wants to weigh 135 pounds    . Weight (lb) < 200 lb (90.7 kg)     12/01/2018, wants to get to 135 pounds      Depression Screen PHQ 2/9 Scores 12/06/2019 08/08/2019 04/04/2019 01/03/2019 12/01/2018 05/12/2018  PHQ - 2 Score 0 0 0 0 0 0  PHQ- 9 Score - - - - 3 -    Fall Risk Fall Risk  12/06/2019 08/08/2019 04/04/2019 01/03/2019 12/01/2018  Falls in the past year? 1 0 0 0 0  Comment mis step - - - -  Number falls in past yr: 0 - - - -  Injury with Fall? 0 - - - -  Risk for fall due to : History of fall(s);Medication side effect - - - Medication side effect  Follow up Falls evaluation completed;Education provided;Falls prevention discussed - - - Education provided;Falls prevention discussed;Falls evaluation completed    Any stairs in or around the home? Yes  If so, are there any without handrails? No  Home free of loose throw rugs in walkways, pet beds, electrical cords, etc? Yes  Adequate lighting in your home to reduce risk of falls? Yes   ASSISTIVE DEVICES UTILIZED TO PREVENT FALLS:  Life alert? No  Use of a cane, walker or w/c? No   Grab bars in the bathroom? Yes  Shower chair or bench in shower? No  Elevated toilet seat or a handicapped toilet? No   TIMED UP AND GO:  Was the test performed? No .      Cognitive Function:     6CIT Screen 12/06/2019 12/01/2018  What Year? 0 points 0 points  What month? 0 points 0 points  What time? 0 points 0 points  Count back from 20 0 points 0 points  Months in reverse 0 points 0 points  Repeat phrase 0 points 0 points  Total Score 0 0    Immunizations Immunization History  Administered Date(s) Administered  . Influenza, High Dose Seasonal PF 01/03/2019  . Pneumococcal Polysaccharide-23 12/06/2019    TDAP status: Due, Education has been provided regarding the importance of this vaccine. Advised may receive this vaccine at local pharmacy or Health Dept. Aware to provide a copy of the vaccination record if obtained from local pharmacy or Health Dept. Verbalized acceptance and understanding. Flu Vaccine status: Up to date Pneumococcal vaccine status: Completed during today's visit. Covid-19 vaccine status: Completed vaccines  Qualifies for Shingles Vaccine? Yes   Zostavax completed No   Shingrix Completed?: No.    Education has been provided regarding the importance of this vaccine. Patient has been advised to call insurance company to determine out of pocket expense if they have not yet received this vaccine. Advised may also receive vaccine at local pharmacy or Health Dept. Verbalized acceptance and understanding.  Screening Tests Health Maintenance  Topic Date Due  . COVID-19 Vaccine (1) Never done  . TETANUS/TDAP  Never done  . INFLUENZA VACCINE  11/19/2019  . DEXA SCAN  Completed  .  PNA vac Low Risk Adult  Completed    Health Maintenance  Health Maintenance Due  Topic Date Due  . COVID-19 Vaccine (1) Never done  . TETANUS/TDAP  Never done  . INFLUENZA VACCINE  11/19/2019    Colorectal cancer screening: No longer required.  Mammogram status: Completed  07/06/2019. Repeat every year Bone Density status: Completed 06/29/2017.  Lung Cancer Screening: (Low Dose CT Chest recommended if Age 38-80 years, 30 pack-year currently smoking OR have quit w/in 15years.) does not qualify.   Lung Cancer Screening Referral: no  Additional Screening:  Hepatitis C Screening: does not qualify;  Vision Screening: Recommended annual ophthalmology exams for early detection of glaucoma and other disorders of the eye. Is the patient up to date with their annual eye exam?  Yes  Who is the provider or what is the name of the office in which the patient attends annual eye exams? Dr. Fletcher Anon If pt is not established with a provider, would they like to be referred to a provider to establish care? No .   Dental Screening: Recommended annual dental exams for proper oral hygiene  Community Resource Referral / Chronic Care Management: CRR required this visit?  No   CCM required this visit?  No      Plan:     I have personally reviewed and noted the following in the patient's chart:   . Medical and social history . Use of alcohol, tobacco or illicit drugs  . Current medications and supplements . Functional ability and status . Nutritional status . Physical activity . Advanced directives . List of other physicians . Hospitalizations, surgeries, and ER visits in previous 12 months . Vitals . Screenings to include cognitive, depression, and falls . Referrals and appointments  In addition, I have reviewed and discussed with patient certain preventive protocols, quality metrics, and best practice recommendations. A written personalized care plan for preventive services as well as general preventive health recommendations were provided to patient.     Kellie Simmering, LPN   5/85/2778   Nurse Notes:

## 2019-12-07 LAB — BMP8+EGFR
BUN/Creatinine Ratio: 10 — ABNORMAL LOW (ref 12–28)
BUN: 11 mg/dL (ref 8–27)
CO2: 26 mmol/L (ref 20–29)
Calcium: 9.3 mg/dL (ref 8.7–10.3)
Chloride: 106 mmol/L (ref 96–106)
Creatinine, Ser: 1.11 mg/dL — ABNORMAL HIGH (ref 0.57–1.00)
GFR calc Af Amer: 53 mL/min/{1.73_m2} — ABNORMAL LOW (ref >59)
GFR calc non Af Amer: 46 mL/min/{1.73_m2} — ABNORMAL LOW (ref >59)
Glucose: 85 mg/dL (ref 65–99)
Potassium: 4.3 mmol/L (ref 3.5–5.2)
Sodium: 141 mmol/L (ref 134–144)

## 2019-12-07 LAB — LIPID PANEL
Chol/HDL Ratio: 4.2 ratio (ref 0.0–4.4)
Cholesterol, Total: 181 mg/dL (ref 100–199)
HDL: 43 mg/dL (ref >39)
LDL Chol Calc (NIH): 123 mg/dL — ABNORMAL HIGH (ref 0–99)
Triglycerides: 79 mg/dL (ref 0–149)
VLDL Cholesterol Cal: 15 mg/dL (ref 5–40)

## 2019-12-08 ENCOUNTER — Telehealth: Payer: Self-pay

## 2019-12-08 NOTE — Telephone Encounter (Signed)
-----   Message from Joy Cain, [place] sent at 12/07/2019  6:16 PM EDT ----- Your kidney functions are better, be sure to stay well hydrated with water. Cholesterol levels are normal except for LDL is 123 goal is less than 99 limit intake of fried and fatty foods.  Make sure she has an appt in 4-6 weeks to recheck her new medication

## 2019-12-08 NOTE — Telephone Encounter (Signed)
lvm for pt to return call for labs results

## 2019-12-27 NOTE — Progress Notes (Signed)
No show

## 2019-12-28 ENCOUNTER — Ambulatory Visit: Payer: Medicare Other | Admitting: Cardiology

## 2020-01-29 ENCOUNTER — Ambulatory Visit: Payer: Medicare Other | Admitting: Nurse Practitioner

## 2020-01-30 DIAGNOSIS — Z23 Encounter for immunization: Secondary | ICD-10-CM | POA: Diagnosis not present

## 2020-02-06 ENCOUNTER — Ambulatory Visit (INDEPENDENT_AMBULATORY_CARE_PROVIDER_SITE_OTHER): Payer: Medicare Other | Admitting: Nurse Practitioner

## 2020-02-06 ENCOUNTER — Other Ambulatory Visit: Payer: Self-pay

## 2020-02-06 ENCOUNTER — Encounter: Payer: Self-pay | Admitting: Nurse Practitioner

## 2020-02-06 VITALS — BP 114/72 | HR 68 | Temp 98.3°F | Ht 60.4 in | Wt 146.4 lb

## 2020-02-06 DIAGNOSIS — R519 Headache, unspecified: Secondary | ICD-10-CM

## 2020-02-06 DIAGNOSIS — H9312 Tinnitus, left ear: Secondary | ICD-10-CM | POA: Diagnosis not present

## 2020-02-06 DIAGNOSIS — G8929 Other chronic pain: Secondary | ICD-10-CM | POA: Diagnosis not present

## 2020-02-06 DIAGNOSIS — N1831 Chronic kidney disease, stage 3a: Secondary | ICD-10-CM

## 2020-02-06 MED ORDER — AMITRIPTYLINE HCL 25 MG PO TABS: 25.0000 mg | ORAL_TABLET | Freq: Every day | ORAL | 3 refills | Status: DC

## 2020-02-06 NOTE — Progress Notes (Signed)
I,Yamilka Roman [employer] as a Education administrator for [employer], FNP.,have documented all relevant documentation on the behalf of Minette Brine, FNP,as directed by  Minette Brine, FNP while in the presence of Minette Brine, [place]. This visit occurred during the SARS-CoV-2 public health emergency.  Safety protocols were in place, including screening questions prior to the visit, additional usage of staff PPE, and extensive cleaning of exam room while observing appropriate contact time as indicated for disinfecting solutions.  Subjective:     Patient ID: Joy Cain , female    DOB: Dec 03, 1935 , 84 y.o.   MRN: [id number]   Chief Complaint  Patient presents with  . Migraine    patient stated the medication she has been presribed has not helped her    HPI  She is having continued headaches, does not feel the medication (amitryptylline helped at all. Only took for 30 days did not realize she had a refill. She states "I feel like it got worse".  She has continued tinnitus to her left ear.  She states when she was taking the medication she had loose stools.  She is drinking 2 liters of water a day    Past Medical History:  Diagnosis Date  . Arthritis   . Asthma   . Atypical chest pain   . Breast cancer ([place])   . Dysphagia   . Dysphagia   . GERD (gastroesophageal reflux disease)   . Hypertension   . Moderate aortic regurgitation   . Osteoporosis      Family History  Problem Relation Age of Onset  . Heart disease Mother        d.95  . Stroke Father        d.60  . Breast cancer Sister 63       d.70s from breast cancer  . Prostate cancer Brother        d.40s from prostate cancer  . Cancer Maternal Uncle        d.80s from unspecified form of cancer  . Breast cancer Sister 77  . Cancer Brother        d.80s from unspecified form of cancer  . Cancer Other        d.38s from unspecified form of cancer. This is the daughter of an unaffected brother.  . Colon cancer Neg Hx   . Esophageal  cancer Neg Hx   . Stomach cancer Neg Hx   . Rectal cancer Neg Hx      Current Outpatient Medications:  .  amitriptyline (ELAVIL) 25 MG tablet, Take 1 tablet (25 mg total) by mouth at bedtime., Disp: 30 tablet, Rfl: 3 .  amLODipine (NORVASC) 5 MG tablet, Take 1 tablet (5 mg total) by mouth daily., Disp: 90 tablet, Rfl: 1 .  Cholecalciferol 25 MCG (1000 UT) capsule, Take by mouth., Disp: , Rfl:  .  dorzolamide-timolol (COSOPT) 22.3-6.8 MG/ML ophthalmic solution, Apply to eye., Disp: , Rfl:  .  TURMERIC PO, Take by mouth., Disp: , Rfl:  .  vitamin B-12 (CYANOCOBALAMIN) 1000 MCG tablet, Take 1,000 mcg by mouth daily., Disp: , Rfl:  .  vitamin C (ASCORBIC ACID) 500 MG tablet, Take 500 mg by mouth daily., Disp: , Rfl:    Allergies  Allergen Reactions  . Aspirin Other (See Comments)    GI TRACT UPSET     Review of Systems  Constitutional: Negative.  Negative for fatigue.  Respiratory: Negative.  Negative for cough.   Cardiovascular: Negative.  Negative for chest pain, palpitations and  leg swelling.  Neurological: Positive for headaches (posterior and anterior). Negative for dizziness.  Psychiatric/Behavioral: Negative.      Today's Vitals   02/06/20 1148  BP: 114/72  Pulse: 68  Temp: 98.3 F (36.8 C)  TempSrc: Oral  Weight: 146 lb 6.4 oz (66.4 kg)  Height: 5' 0.4" (1.534 m)  PainSc: 0-No pain   Body mass index is 28.21 kg/m.   Objective:  Physical Exam Vitals reviewed.  Constitutional:      General: She is not in acute distress.    Appearance: Normal appearance.  Eyes:     Pupils: Pupils are equal, round, and reactive to light.  Cardiovascular:     Rate and Rhythm: Normal rate and regular rhythm.     Pulses: Normal pulses.     Heart sounds: Normal heart sounds. No murmur heard.   Pulmonary:     Effort: Pulmonary effort is normal. No respiratory distress.     Breath sounds: Normal breath sounds. No wheezing.  Neurological:     General: No focal deficit present.      Mental Status: She is alert and oriented to person, place, and time.     Cranial Nerves: No cranial nerve deficit.     Motor: No weakness.  Psychiatric:        Mood and Affect: Mood normal.        Behavior: Behavior normal.        Thought Content: Thought content normal.        Judgment: Judgment normal.         Assessment And Plan:     1. Chronic nonintractable headache, unspecified headache type  The 10 mg dose of amitriptyline has been ineffective, will increase to 25 mg daily.   She is to take magnesium 250 mg with evening meal.  - amitriptyline (ELAVIL) 25 MG tablet; Take 1 tablet (25 mg total) by mouth at bedtime.  Dispense: 30 tablet; Refill: 3  2. Tinnitus of left ear  Persistent tinnitus left ear   Will refer to ENT for further evaluation - Ambulatory referral to ENT  3. Stage 3a chronic kidney disease ([place])  Improved at last visit will recheck levels - BMP8+eGFR     Patient was given opportunity to ask questions. Patient verbalized understanding of the plan and was able to repeat key elements of the plan. All questions were answered to their satisfaction.  Minette Brine, FNP   I, Minette Brine, FNP, have reviewed all documentation for this visit. The documentation on 02/06/20 for the exam, diagnosis, procedures, and orders are all accurate and complete.  THE PATIENT IS ENCOURAGED TO PRACTICE SOCIAL DISTANCING DUE TO THE COVID-19 PANDEMIC.

## 2020-02-07 LAB — BMP8+EGFR
BUN/Creatinine Ratio: 17 (ref 12–28)
BUN: 16 mg/dL (ref 8–27)
CO2: 24 mmol/L (ref 20–29)
Calcium: 9.3 mg/dL (ref 8.7–10.3)
Chloride: 110 mmol/L — ABNORMAL HIGH (ref 96–106)
Creatinine, Ser: 0.93 mg/dL (ref 0.57–1.00)
GFR calc Af Amer: 66 mL/min/{1.73_m2} (ref >59)
GFR calc non Af Amer: 57 mL/min/{1.73_m2} — ABNORMAL LOW (ref >59)
Glucose: 77 mg/dL (ref 65–99)
Potassium: 4.3 mmol/L (ref 3.5–5.2)
Sodium: 144 mmol/L (ref 134–144)

## 2020-02-13 ENCOUNTER — Other Ambulatory Visit: Payer: Self-pay

## 2020-02-13 ENCOUNTER — Ambulatory Visit (INDEPENDENT_AMBULATORY_CARE_PROVIDER_SITE_OTHER): Payer: Medicare Other

## 2020-02-13 VITALS — BP 130/68 | HR 71 | Temp 98.5°F | Ht 60.8 in | Wt 145.8 lb

## 2020-02-13 DIAGNOSIS — Z23 Encounter for immunization: Secondary | ICD-10-CM

## 2020-02-13 NOTE — Progress Notes (Signed)
Patient is here today for influenza vaccine.

## 2020-02-15 DIAGNOSIS — H401131 Primary open-angle glaucoma, bilateral, mild stage: Secondary | ICD-10-CM | POA: Diagnosis not present

## 2020-03-07 ENCOUNTER — Other Ambulatory Visit: Payer: Self-pay

## 2020-03-07 DIAGNOSIS — R519 Headache, unspecified: Secondary | ICD-10-CM

## 2020-03-07 DIAGNOSIS — G8929 Other chronic pain: Secondary | ICD-10-CM

## 2020-03-07 MED ORDER — AMITRIPTYLINE HCL 25 MG PO TABS: 25.0000 mg | ORAL_TABLET | Freq: Every day | ORAL | 3 refills | Status: DC

## 2020-03-20 ENCOUNTER — Ambulatory Visit (INDEPENDENT_AMBULATORY_CARE_PROVIDER_SITE_OTHER): Payer: Medicare Other | Admitting: Otolaryngology

## 2020-04-04 ENCOUNTER — Ambulatory Visit: Payer: Medicare Other | Admitting: Nurse Practitioner

## 2020-04-15 ENCOUNTER — Other Ambulatory Visit: Payer: Self-pay

## 2020-04-15 DIAGNOSIS — I10 Essential (primary) hypertension: Secondary | ICD-10-CM

## 2020-04-15 DIAGNOSIS — G8929 Other chronic pain: Secondary | ICD-10-CM

## 2020-04-15 DIAGNOSIS — R519 Headache, unspecified: Secondary | ICD-10-CM

## 2020-04-15 MED ORDER — AMLODIPINE BESYLATE 5 MG PO TABS: 5.0000 mg | ORAL_TABLET | Freq: Every day | ORAL | 1 refills | Status: DC

## 2020-04-15 MED ORDER — AMITRIPTYLINE HCL 25 MG PO TABS: 25.0000 mg | ORAL_TABLET | Freq: Every day | ORAL | 3 refills | Status: DC

## 2020-04-29 ENCOUNTER — Ambulatory Visit: Payer: Medicare Other | Admitting: Nurse Practitioner

## 2020-04-30 ENCOUNTER — Encounter: Payer: Self-pay | Admitting: Nurse Practitioner

## 2020-04-30 ENCOUNTER — Other Ambulatory Visit: Payer: Self-pay

## 2020-04-30 ENCOUNTER — Ambulatory Visit (INDEPENDENT_AMBULATORY_CARE_PROVIDER_SITE_OTHER): Payer: Medicare Other | Admitting: Nurse Practitioner

## 2020-04-30 VITALS — BP 120/70 | HR 62 | Temp 98.1°F | Wt 149.2 lb

## 2020-04-30 DIAGNOSIS — G8929 Other chronic pain: Secondary | ICD-10-CM | POA: Diagnosis not present

## 2020-04-30 DIAGNOSIS — R519 Headache, unspecified: Secondary | ICD-10-CM | POA: Diagnosis not present

## 2020-04-30 NOTE — Progress Notes (Signed)
I,Tobin Witucki [employer] as a Education administrator for [employer], FNP.,have documented all relevant documentation on the behalf of Minette Brine, FNP,as directed by  Minette Brine, FNP while in the presence of Minette Brine, [place]. This visit occurred during the SARS-CoV-2 public health emergency.  Safety protocols were in place, including screening questions prior to the visit, additional usage of staff PPE, and extensive cleaning of exam room while observing appropriate contact time as indicated for disinfecting solutions.  Subjective:     Patient ID: Joy Cain , female    DOB: [birth year] , 85 y.o.   MRN: [id number]   Chief Complaint  Patient presents with  . Headache    HPI  Patient presents today for a f/u on her headaches. She stated that the medicine is helping her headaches a little bit she still has them but they aren't as bad. She is currently taking amitriptylline 25 mg and feels her headaches are improving.   She mentions she can be walking and will be off balance to the left. She has a history of tinnitus to her left ear. She is to see Dr. Lucia Gaskins ENT   Headache  This is a chronic problem. The current episode started more than 1 year ago. The problem occurs intermittently. The problem has been gradually improving. The pain does not radiate. The pain quality is similar to prior headaches. The quality of the pain is described as aching. Associated symptoms include tinnitus. Pertinent negatives include no abdominal pain, insomnia or tingling.     Past Medical History:  Diagnosis Date  . Arthritis   . Asthma   . Atypical chest pain   . Breast cancer ([place])   . Dysphagia   . Dysphagia   . GERD (gastroesophageal reflux disease)   . Hypertension   . Moderate aortic regurgitation   . Osteoporosis      Family History  Problem Relation Age of Onset  . Heart disease Mother        d.95  . Stroke Father        d.60  . Breast cancer Sister 74       d.70s from breast cancer  .  Prostate cancer Brother        d.40s from prostate cancer  . Cancer Maternal Uncle        d.80s from unspecified form of cancer  . Breast cancer Sister 70  . Cancer Brother        d.80s from unspecified form of cancer  . Cancer Other        d.38s from unspecified form of cancer. This is the daughter of an unaffected brother.  . Colon cancer Neg Hx   . Esophageal cancer Neg Hx   . Stomach cancer Neg Hx   . Rectal cancer Neg Hx      Current Outpatient Medications:  .  amitriptyline (ELAVIL) 25 MG tablet, Take 1 tablet (25 mg total) by mouth at bedtime., Disp: 30 tablet, Rfl: 3 .  amLODipine (NORVASC) 5 MG tablet, Take 1 tablet (5 mg total) by mouth daily., Disp: 90 tablet, Rfl: 1 .  Cholecalciferol 25 MCG (1000 UT) capsule, Take by mouth., Disp: , Rfl:  .  dorzolamide-timolol (COSOPT) 22.3-6.8 MG/ML ophthalmic solution, Apply to eye., Disp: , Rfl:  .  TURMERIC PO, Take by mouth., Disp: , Rfl:  .  vitamin B-12 (CYANOCOBALAMIN) 1000 MCG tablet, Take 1,000 mcg by mouth daily., Disp: , Rfl:  .  vitamin C (ASCORBIC ACID) 500 MG tablet, Take  500 mg by mouth daily., Disp: , Rfl:    Allergies  Allergen Reactions  . Aspirin Other (See Comments)    GI TRACT UPSET     Review of Systems  HENT: Positive for tinnitus.   Gastrointestinal: Negative for abdominal pain.  Neurological: Positive for headaches. Negative for tingling.  Psychiatric/Behavioral: The patient does not have insomnia.      Today's Vitals   04/30/20 1029  BP: 120/70  Pulse: 62  Temp: 98.1 F (36.7 C)  Weight: 149 lb 3.2 oz (67.7 kg)  PainSc: 5   PainLoc: Head   Body mass index is 28.38 kg/m.   Objective:  Physical Exam Vitals reviewed.  Constitutional:      General: She is not in acute distress.    Appearance: She is well-developed.  Cardiovascular:     Heart sounds: Normal heart sounds. No murmur heard.   Pulmonary:     Effort: Pulmonary effort is normal. No respiratory distress.     Breath sounds:  Normal breath sounds. No wheezing.  Neurological:     Mental Status: She is alert.  Psychiatric:        Mood and Affect: Mood normal.        Speech: Speech normal.        Behavior: Behavior normal.         Assessment And Plan:     1. Chronic nonintractable headache, unspecified headache type   Continues but has slight improvement and she is not interested in increasing the dose of amitriptylline, she is tolerating well.  Patient was given opportunity to ask questions. Patient verbalized understanding of the plan and was able to repeat key elements of the plan. All questions were answered to their satisfaction.  Minette Brine, FNP   I, Minette Brine, FNP, have reviewed all documentation for this visit. The documentation on 05/19/20 for the exam, diagnosis, procedures, and orders are all accurate and complete.   THE PATIENT IS ENCOURAGED TO PRACTICE SOCIAL DISTANCING DUE TO THE COVID-19 PANDEMIC.

## 2020-05-29 ENCOUNTER — Other Ambulatory Visit: Payer: Self-pay

## 2020-05-29 ENCOUNTER — Ambulatory Visit (INDEPENDENT_AMBULATORY_CARE_PROVIDER_SITE_OTHER): Payer: Medicare Other | Admitting: Otolaryngology

## 2020-05-29 ENCOUNTER — Encounter (INDEPENDENT_AMBULATORY_CARE_PROVIDER_SITE_OTHER): Payer: Self-pay | Admitting: Otolaryngology

## 2020-05-29 VITALS — Temp 97.0°F

## 2020-05-29 DIAGNOSIS — H9312 Tinnitus, left ear: Secondary | ICD-10-CM | POA: Diagnosis not present

## 2020-05-29 DIAGNOSIS — H903 Sensorineural hearing loss, bilateral: Secondary | ICD-10-CM | POA: Diagnosis not present

## 2020-05-29 NOTE — Progress Notes (Signed)
HPI: Joy Cain is a 85 y.o. female who presents is referred by her PCP for evaluation of left ear tinnitus that she has had for a number of years.  She has not noted any hearing problems and feels like she has good hearing in both ears.  She also complains of mild headaches.  Past Medical History:  Diagnosis Date  . Arthritis   . Asthma   . Atypical chest pain   . Breast cancer ([place])   . Dysphagia   . Dysphagia   . GERD (gastroesophageal reflux disease)   . Hypertension   . Moderate aortic regurgitation   . Osteoporosis    Past Surgical History:  Procedure Laterality Date  . BUNIONECTOMY    . CHOLECYSTECTOMY  09/2015  . HAMMER TOE SURGERY    . MASTECTOMY    . MASTECTOMY Left 1987   Social History   Socioeconomic History  . Marital status: Married    Spouse name: Not on file  . Number of children: 3  . Years of education: Not on file  . Highest education level: Not on file  Occupational History  . Occupation: retired  Tobacco Use  . Smoking status: Never Smoker  . Smokeless tobacco: Never Used  Vaping Use  . Vaping Use: Never used  Substance and Sexual Activity  . Alcohol use: No    Alcohol/week: 0.0 standard drinks  . Drug use: No  . Sexual activity: Yes  Other Topics Concern  . Not on file  Social History Narrative   Lives with husband in a 2 story home.  Has 3 children.    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     Social Determinants of Health   Financial Resource Strain: Low Risk   . Difficulty of Paying Living Expenses: Not hard at all  Food Insecurity: No Food Insecurity  . Worried About Charity fundraiser in the Last Year: Never true  . Ran Out of Food in the Last Year: Never true  Transportation Needs: No Transportation Needs  . Lack of Transportation (Medical): No  . Lack of Transportation (Non-Medical): No  Physical Activity: Insufficiently Active  . Days of Exercise per Week: 3 days  . Minutes of Exercise per Session: 30 min  Stress:  No Stress Concern Present  . Feeling of Stress : Not at all  Social Connections: Not on file   Family History  Problem Relation Age of Onset  . Heart disease Mother        d.95  . Stroke Father        d.60  . Breast cancer Sister 43       d.70s from breast cancer  . Prostate cancer Brother        d.40s from prostate cancer  . Cancer Maternal Uncle        d.80s from unspecified form of cancer  . Breast cancer Sister 10  . Cancer Brother        d.80s from unspecified form of cancer  . Cancer Other        d.38s from unspecified form of cancer. This is the daughter of an unaffected brother.  . Colon cancer Neg Hx   . Esophageal cancer Neg Hx   . Stomach cancer Neg Hx   . Rectal cancer Neg Hx    Allergies  Allergen Reactions  . Aspirin Other (See Comments)    GI TRACT UPSET   Prior to Admission medications   Medication Sig Start Date  End Date Taking? Authorizing Provider  amitriptyline (ELAVIL) 25 MG tablet Take 1 tablet (25 mg total) by mouth at bedtime. 04/15/20   Minette Brine, FNP  amLODipine (NORVASC) 5 MG tablet Take 1 tablet (5 mg total) by mouth daily. 04/15/20 04/15/21  Minette Brine, FNP  Cholecalciferol 25 MCG (1000 UT) capsule Take by mouth.    [provider]  dorzolamide-timolol (COSOPT) 22.3-6.8 MG/ML ophthalmic solution Apply to eye. 06/23/19 06/22/20  [provider]  TURMERIC PO Take by mouth.    [provider]  vitamin B-12 (CYANOCOBALAMIN) 1000 MCG tablet Take 1,000 mcg by mouth daily.    [provider]  vitamin C (ASCORBIC ACID) 500 MG tablet Take 500 mg by mouth daily.    [provider]     Positive ROS: Otherwise negative  All other systems have been reviewed and were otherwise negative with the exception of those mentioned in the HPI and as above.  Physical Exam: Constitutional: Alert, well-appearing, no acute distress Ears: External ears without lesions or tenderness. Ear canals are clear bilaterally.   Both TMs are clear with good mobility pneumatic otoscopy.  Auscultation of the ears reveals no objective tinnitus or sounds. Nasal: External nose without lesions. Septum with mild deformity and mild rhinitis. Clear nasal passages otherwise.  Both middle meatus regions are clear. Oral: Lips and gums without lesions. Tongue and palate mucosa without lesions. Posterior oropharynx clear. Neck: No palpable adenopathy or masses Respiratory: Breathing comfortably  Skin: No facial/neck lesions or rash noted.  Audiogram demonstrated normal hearing in the lower frequencies but above 3000 frequency in both ears she had a downsloping sensorineural hearing loss consistent with presbycusis down to approximately 50-60 dB.  SRT's were 15 dB bilaterally consistent with normal hearing.  She had type A tympanograms bilaterally.  Procedures  Assessment: Tinnitus secondary to presbycusis or high-frequency sensorineural hearing loss which was symmetric in both ears.  Plan: Reviewed tinnitus with the patient in the office today.  I discussed using masking noise when the tinnitus is bad.  Also cautioned her about using ear protection when around loud noise. Gave her some samples of Lipo flavonoid to try as this is beneficial in some people with tinnitus.   Radene Journey, MD   CC:

## 2020-06-21 ENCOUNTER — Encounter (INDEPENDENT_AMBULATORY_CARE_PROVIDER_SITE_OTHER): Payer: Self-pay

## 2020-06-28 DIAGNOSIS — Z853 Personal history of malignant neoplasm of breast: Secondary | ICD-10-CM | POA: Diagnosis not present

## 2020-06-28 DIAGNOSIS — Z9189 Other specified personal risk factors, not elsewhere classified: Secondary | ICD-10-CM | POA: Diagnosis not present

## 2020-06-28 DIAGNOSIS — Z8739 Personal history of other diseases of the musculoskeletal system and connective tissue: Secondary | ICD-10-CM | POA: Diagnosis not present

## 2020-07-02 ENCOUNTER — Other Ambulatory Visit: Payer: Self-pay | Admitting: Nurse Practitioner

## 2020-07-02 DIAGNOSIS — Z8739 Personal history of other diseases of the musculoskeletal system and connective tissue: Secondary | ICD-10-CM

## 2020-07-16 ENCOUNTER — Telehealth: Payer: Self-pay

## 2020-07-16 NOTE — Telephone Encounter (Signed)
Returned pt call about wanting an appointment. No answer, message left stating for her to call us at her earliest convenience for an appointment.

## 2020-07-24 ENCOUNTER — Other Ambulatory Visit: Payer: Self-pay | Admitting: Nurse Practitioner

## 2020-07-24 DIAGNOSIS — I10 Essential (primary) hypertension: Secondary | ICD-10-CM

## 2020-07-25 DIAGNOSIS — Z20822 Contact with and (suspected) exposure to covid-19: Secondary | ICD-10-CM | POA: Diagnosis not present

## 2020-08-10 ENCOUNTER — Other Ambulatory Visit: Payer: Medicare Other

## 2020-08-28 ENCOUNTER — Ambulatory Visit: Payer: Medicare Other | Admitting: Nurse Practitioner

## 2020-09-02 ENCOUNTER — Other Ambulatory Visit: Payer: Self-pay

## 2020-09-02 DIAGNOSIS — R519 Headache, unspecified: Secondary | ICD-10-CM

## 2020-09-02 MED ORDER — AMITRIPTYLINE HCL 25 MG PO TABS: 25.0000 mg | ORAL_TABLET | Freq: Every day | ORAL | 3 refills | Status: DC

## 2020-09-05 ENCOUNTER — Encounter: Payer: Self-pay | Admitting: Nurse Practitioner

## 2020-09-05 ENCOUNTER — Other Ambulatory Visit: Payer: Self-pay

## 2020-09-05 ENCOUNTER — Ambulatory Visit (INDEPENDENT_AMBULATORY_CARE_PROVIDER_SITE_OTHER): Payer: Medicare Other | Admitting: Nurse Practitioner

## 2020-09-05 VITALS — BP 114/80 | HR 66 | Temp 98.2°F | Ht 60.8 in | Wt 147.2 lb

## 2020-09-05 DIAGNOSIS — R197 Diarrhea, unspecified: Secondary | ICD-10-CM

## 2020-09-05 DIAGNOSIS — E7800 Pure hypercholesterolemia, unspecified: Secondary | ICD-10-CM

## 2020-09-05 DIAGNOSIS — H9319 Tinnitus, unspecified ear: Secondary | ICD-10-CM

## 2020-09-05 DIAGNOSIS — R519 Headache, unspecified: Secondary | ICD-10-CM

## 2020-09-05 DIAGNOSIS — G8929 Other chronic pain: Secondary | ICD-10-CM | POA: Diagnosis not present

## 2020-09-05 MED ORDER — AMITRIPTYLINE HCL 25 MG PO TABS: 25.0000 mg | ORAL_TABLET | Freq: Every day | ORAL | 1 refills | Status: DC

## 2020-09-05 MED ORDER — AMOXICILLIN 500 MG PO TABS: 500.0000 mg | ORAL_TABLET | Freq: Two times a day (BID) | ORAL | 0 refills | Status: DC

## 2020-09-05 NOTE — Progress Notes (Signed)
I,Yamilka Roman [employer] as a Education administrator for [employer], FNP.,have documented all relevant documentation on the behalf of Minette Brine, FNP,as directed by  Minette Brine, FNP while in the presence of Minette Brine, [place]. This visit occurred during the SARS-CoV-2 public health emergency.  Safety protocols were in place, including screening questions prior to the visit, additional usage of staff PPE, and extensive cleaning of exam room while observing appropriate contact time as indicated for disinfecting solutions.  Subjective:     Patient ID: Joy Cain , female    DOB: 08-25-1935 , 85 y.o.   MRN: [id number]   Chief Complaint  Patient presents with   Hyperlipidemia   Migraine    HPI  Patient presents today for a f/u on her headaches and cholesterol.   Reports having stomach issues - she will eat and as soon as she eats she will have a bowel movements. She is unable to correlate with anything she is eating. She has been hesitant to eat.  She has stomach soreness. Denies nausea. She describes the bowel movements as diarrhea.   She is scheduled to have a bone density on Monday   Headache  This is a chronic problem. The current episode started more than 1 year ago. The problem occurs intermittently. The problem has been gradually improving. The pain does not radiate. The pain quality is similar to prior headaches. The quality of the pain is described as aching. Associated symptoms include tinnitus. Pertinent negatives include no abdominal pain, dizziness, insomnia or tingling. Nothing aggravates the symptoms.      Past Medical History:  Diagnosis Date   Arthritis    Asthma    Atypical chest pain    Breast cancer ([facility])    Dysphagia    Dysphagia    GERD (gastroesophageal reflux disease)    Hypertension    Moderate aortic regurgitation    Osteoporosis      Family History  Problem Relation Age of Onset   Heart disease Mother        d.95   Stroke Father        d.70   Breast  cancer Sister 71       d.70s from breast cancer   Prostate cancer Brother        d.40s from prostate cancer   Cancer Maternal Uncle        d.80s from unspecified form of cancer   Breast cancer Sister 43   Cancer Brother        d.80s from unspecified form of cancer   Cancer Other        d.38s from unspecified form of cancer. This is the daughter of an unaffected brother.   Colon cancer Neg Hx    Esophageal cancer Neg Hx    Stomach cancer Neg Hx    Rectal cancer Neg Hx      Current Outpatient Medications:    amLODipine (NORVASC) 5 MG tablet, TAKE 1 TABLET(5 MG) BY MOUTH DAILY, Disp: 90 tablet, Rfl: 1   amoxicillin (AMOXIL) 500 MG tablet, Take 1 tablet (500 mg total) by mouth 2 (two) times daily., Disp: 14 tablet, Rfl: 0   Cholecalciferol 25 MCG (1000 UT) capsule, Take by mouth., Disp: , Rfl:    TURMERIC PO, Take by mouth., Disp: , Rfl:    vitamin B-12 (CYANOCOBALAMIN) 1000 MCG tablet, Take 1,000 mcg by mouth daily., Disp: , Rfl:    vitamin C (ASCORBIC ACID) 500 MG tablet, Take 500 mg by mouth daily., Disp: ,  Rfl:    amitriptyline (ELAVIL) 25 MG tablet, Take 1 tablet (25 mg total) by mouth at bedtime., Disp: 90 tablet, Rfl: 1   Allergies  Allergen Reactions   Aspirin Other (See Comments)    GI TRACT UPSET     Review of Systems  HENT:  Positive for tinnitus.   Respiratory: Negative.    Cardiovascular: Negative.   Gastrointestinal:  Positive for diarrhea. Negative for abdominal pain.  Neurological:  Positive for headaches. Negative for dizziness and tingling.  Psychiatric/Behavioral:  The patient does not have insomnia.     Today's Vitals   09/05/20 1105  BP: 114/80  Pulse: 66  Temp: 98.2 F (36.8 C)  TempSrc: Oral  Weight: 147 lb 3.2 oz (66.8 kg)  Height: 5' 0.8" (1.544 m)  PainSc: 0-No pain   Body mass index is 28 kg/m.   Objective:  Physical Exam Vitals reviewed.  Constitutional:      General: She is not in acute distress.    Appearance: She is  well-developed.  Cardiovascular:     Heart sounds: Normal heart sounds. No murmur heard. Pulmonary:     Effort: Pulmonary effort is normal. No respiratory distress.     Breath sounds: Normal breath sounds. No wheezing.  Abdominal:     General: Abdomen is flat. Bowel sounds are normal. There is no distension.     Palpations: Abdomen is soft.     Tenderness: There is no abdominal tenderness.  Neurological:     Mental Status: She is alert.  Psychiatric:        Mood and Affect: Mood normal.        Speech: Speech normal.        Behavior: Behavior normal.        Thought Content: Thought content normal.        Judgment: Judgment normal.        Assessment And Plan:     1. Chronic nonintractable headache, unspecified headache type Continues to tolerate amitriptyline well - amitriptyline (ELAVIL) 25 MG tablet; Take 1 tablet (25 mg total) by mouth at bedtime.  Dispense: 90 tablet; Refill: 1  2. Diarrhea, unspecified type She is encouraged to stay well hydrated with water She is also encouraged to take a probiotic daily - amoxicillin (AMOXIL) 500 MG tablet; Take 1 tablet (500 mg total) by mouth 2 (two) times daily.  Dispense: 14 tablet; Refill: 0 - BMP8+eGFR  3. Elevated LDL cholesterol level Chronic, stable Diet controlled, pending results will consider starting a medication - Lipid panel - BMP8+eGFR     Patient was given opportunity to ask questions. Patient verbalized understanding of the plan and was able to repeat key elements of the plan. All questions were answered to their satisfaction.  Minette Brine, FNP   I, Minette Brine, FNP, have reviewed all documentation for this visit. The documentation on 09/05/20 for the exam, diagnosis, procedures, and orders are all accurate and complete.   IF YOU HAVE BEEN REFERRED TO A SPECIALIST, IT MAY TAKE 1-2 WEEKS TO SCHEDULE/PROCESS THE REFERRAL. IF YOU HAVE NOT HEARD FROM US/SPECIALIST IN TWO WEEKS, PLEASE GIVE [place] A CALL AT [phone] X  [phone].   THE PATIENT IS ENCOURAGED TO PRACTICE SOCIAL DISTANCING DUE TO THE COVID-19 PANDEMIC.

## 2020-09-09 ENCOUNTER — Ambulatory Visit
Admission: RE | Admit: 2020-09-09 | Discharge: 2020-09-09 | Disposition: A | Discharge status: Home / Self Care | Payer: Medicare Other | Source: Ambulatory Visit | Attending: Nurse Practitioner | Admitting: Nurse Practitioner

## 2020-09-09 ENCOUNTER — Other Ambulatory Visit: Payer: Self-pay

## 2020-09-09 DIAGNOSIS — M8588 Other specified disorders of bone density and structure, other site: Secondary | ICD-10-CM | POA: Diagnosis not present

## 2020-09-09 DIAGNOSIS — Z780 Asymptomatic menopausal state: Secondary | ICD-10-CM | POA: Diagnosis not present

## 2020-09-09 DIAGNOSIS — Z8739 Personal history of other diseases of the musculoskeletal system and connective tissue: Secondary | ICD-10-CM

## 2020-10-04 IMAGING — MG DIGITAL SCREENING UNILATERAL RIGHT MAMMOGRAM WITH CAD AND TOMO
4 series · 4 of 12 positions shown · non-contrast
Comparison: Previous exam(s).

CLINICAL DATA: Screening.

EXAM:
DIGITAL SCREENING UNILATERAL RIGHT MAMMOGRAM WITH CAD AND TOMO

[R CC synth-2D]
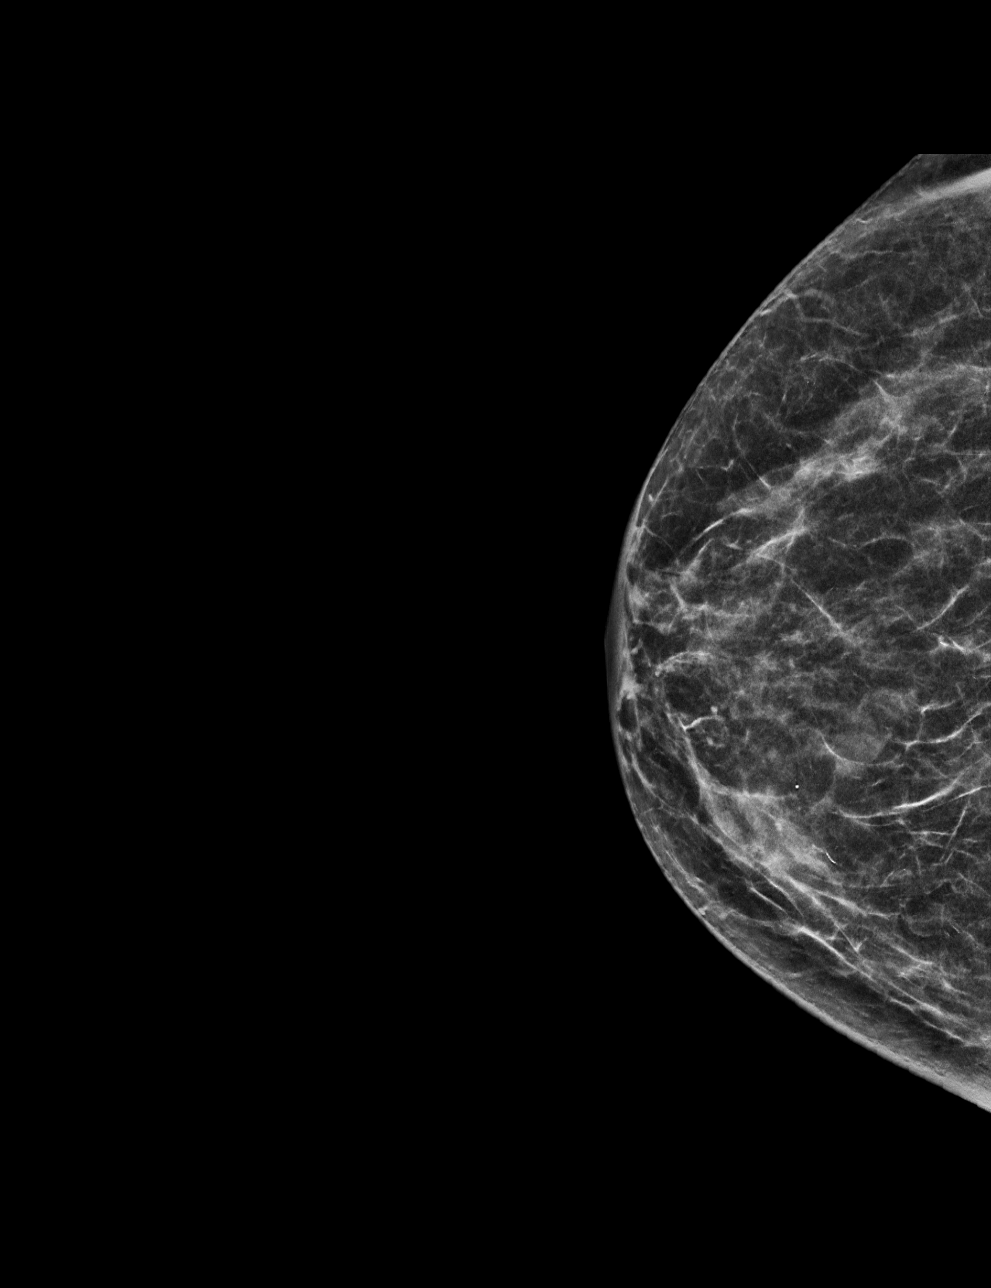

[R MLO synth-2D]
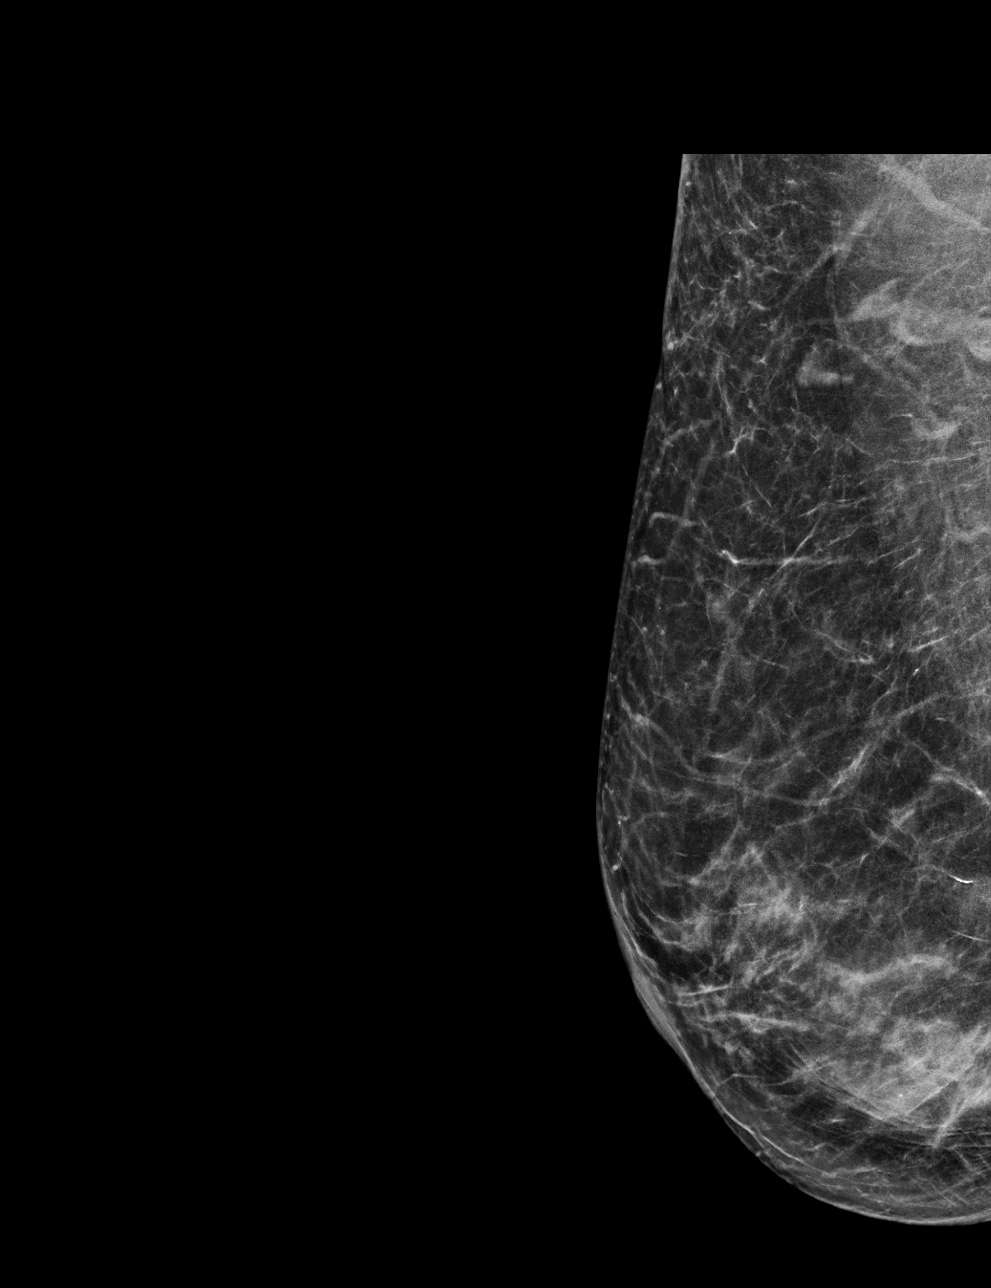

[R CC tomo · tomo slice 27/52.0]
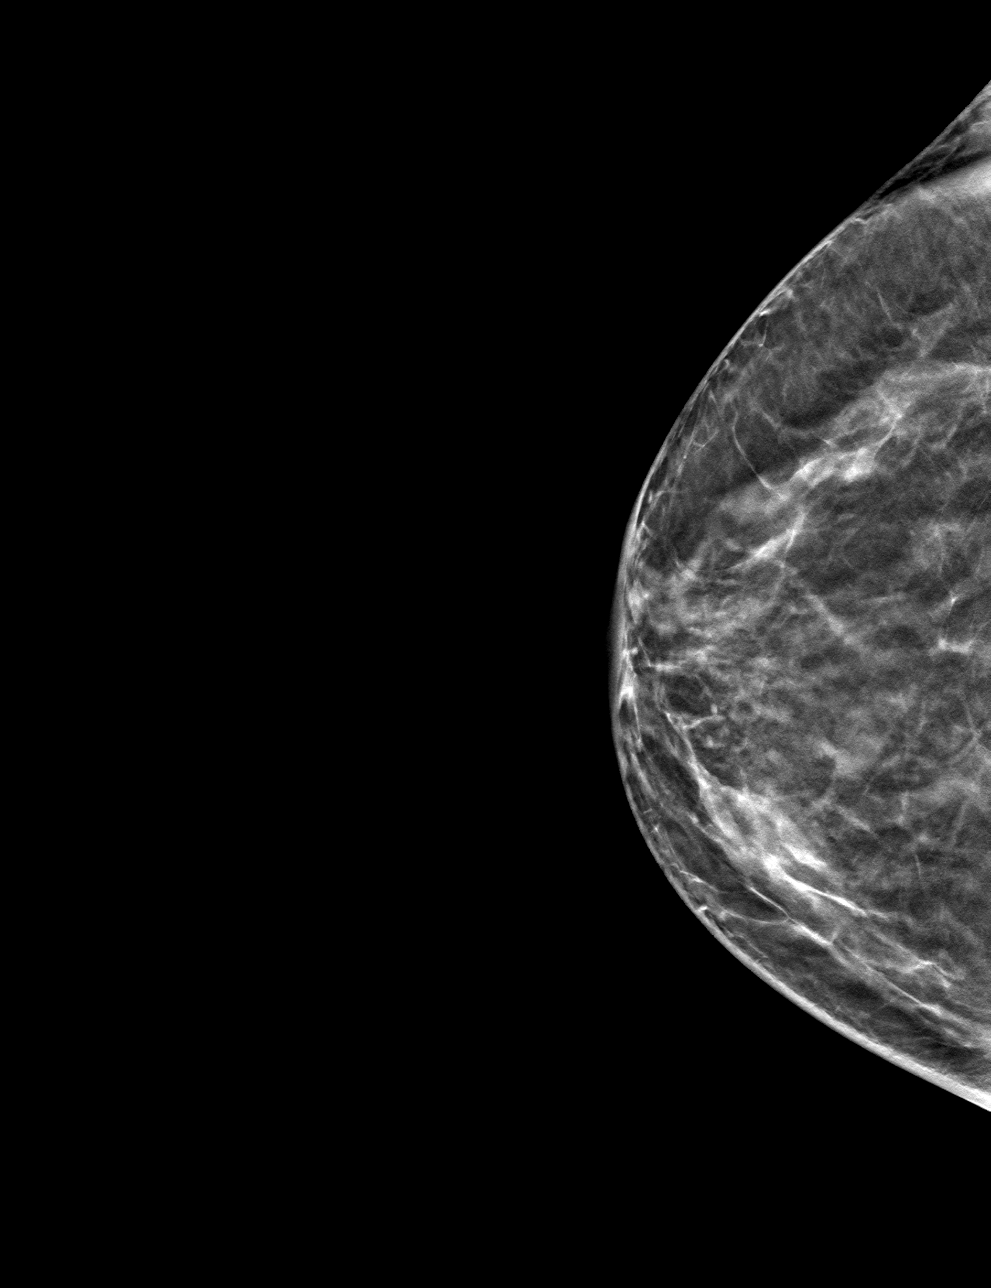

[R MLO tomo · tomo slice 29/58.0]
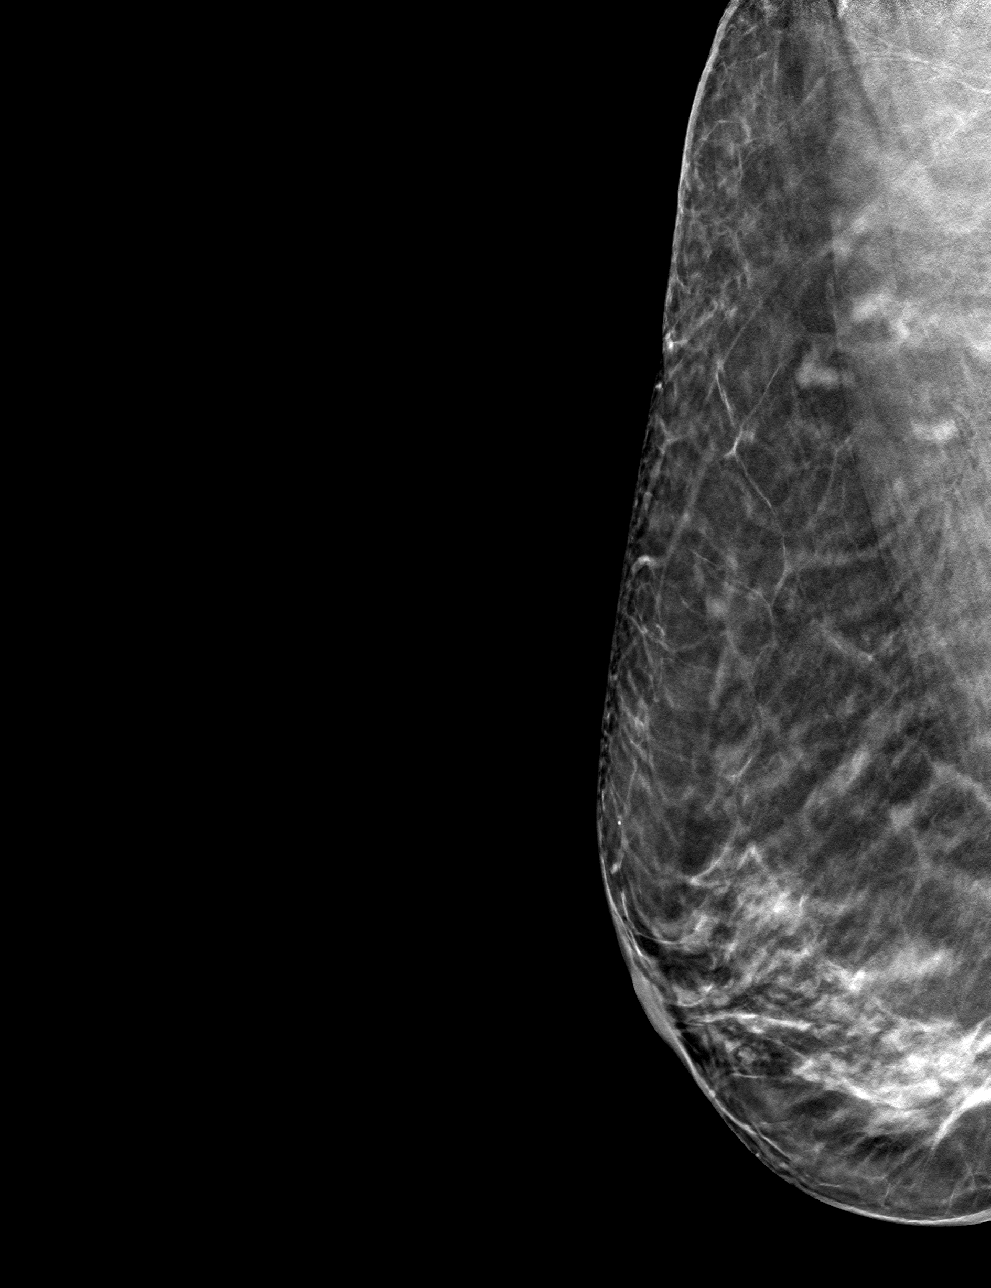

[4 of 12 positions shown; findings below may reference images not displayed]

ACR Breast Density Category c: The breast tissue is heterogeneously
dense, which may obscure small masses.
FINDINGS: There are no findings suspicious for malignancy. Images were
processed with CAD.
IMPRESSION: No mammographic evidence of malignancy. A result letter of this
screening mammogram will be mailed directly to the patient.

RECOMMENDATION:
Screening mammogram in one year. (Code:[id number]-[id number])

BI-RADS CATEGORY  1: Negative.

## 2020-11-19 DIAGNOSIS — D259 Leiomyoma of uterus, unspecified: Secondary | ICD-10-CM | POA: Diagnosis not present

## 2020-12-11 ENCOUNTER — Ambulatory Visit: Payer: Medicare Other | Admitting: Nurse Practitioner

## 2020-12-11 ENCOUNTER — Telehealth: Payer: Self-pay

## 2020-12-11 NOTE — Telephone Encounter (Signed)
This nurse called patient in regards to missed appointments. Message left for her to call back in order to reschedule.

## 2020-12-18 ENCOUNTER — Telehealth: Payer: Self-pay | Admitting: Nurse Practitioner

## 2020-12-18 NOTE — Telephone Encounter (Signed)
Left message for patient to call back and schedule Medicare Annual Wellness Visit (AWV) either virtually or in office.  Left  my Joy Cain number [phone]   Last AWV 12/06/19 please schedule at anytime with [facility]    This should be a 45 minute visit.

## 2021-01-15 ENCOUNTER — Telehealth: Payer: Self-pay | Admitting: Nurse Practitioner

## 2021-01-15 NOTE — Telephone Encounter (Signed)
Left message for patient to call back and schedule Medicare Annual Wellness Visit (AWV) either virtually or in office.  Left both my jabber number [phone] and office number    Last AWV 12/06/19 ; please schedule at anytime with [facility]    This should be a 45 minute visit.

## 2021-02-11 ENCOUNTER — Telehealth: Payer: Self-pay | Admitting: Nurse Practitioner

## 2021-02-11 NOTE — Telephone Encounter (Signed)
Left message for patient to call back and schedule Medicare Annual Wellness Visit (AWV) either virtually or in office.  Left both my jabber number [phone] and office number    Last AWV 12/06/19 please schedule at anytime with [facility]    This should be a 45 minute visit.

## 2021-03-19 ENCOUNTER — Other Ambulatory Visit: Payer: Self-pay

## 2021-03-19 ENCOUNTER — Ambulatory Visit (INDEPENDENT_AMBULATORY_CARE_PROVIDER_SITE_OTHER): Payer: Medicare Other

## 2021-03-19 VITALS — BP 114/60 | HR 79 | Temp 98.7°F | Ht 60.8 in | Wt 152.4 lb

## 2021-03-19 DIAGNOSIS — Z0000 Encounter for general adult medical examination without abnormal findings: Secondary | ICD-10-CM | POA: Diagnosis not present

## 2021-03-19 DIAGNOSIS — Z23 Encounter for immunization: Secondary | ICD-10-CM | POA: Diagnosis not present

## 2021-03-19 NOTE — Progress Notes (Signed)
This visit occurred during the SARS-CoV-2 public health emergency.  Safety protocols were in place, including screening questions prior to the visit, additional usage of staff PPE, and extensive cleaning of exam room while observing appropriate contact time as indicated for disinfecting solutions.  Subjective:   Joy Cain is a 85 y.o. female who presents for Medicare Annual (Subsequent) preventive examination.  Review of Systems     Cardiac Risk Factors include: advanced age (>81men, >54 women);hypertension     Objective:    Today's Vitals   03/19/21 1145  BP: 114/60  Pulse: 79  Temp: 98.7 F (37.1 C)  TempSrc: Oral  SpO2: 97%  Weight: 152 lb 6.4 oz (69.1 kg)  Height: 5' 0.8" (1.544 m)   Body mass index is 28.99 kg/m.  Advanced Directives 03/19/2021 12/06/2019 12/01/2018 04/09/2015 02/21/2015 02/13/2014  Does Patient Have a Medical Advance Directive? Yes No No No No No  Type of Advance Directive Living will - - - - -  Would patient like information on creating a medical advance directive? - - No - Patient declined No - patient declined information - No - patient declined information    Current Medications (verified) Outpatient Encounter Medications as of 03/19/2021  Medication Sig   amitriptyline (ELAVIL) 25 MG tablet Take 1 tablet (25 mg total) by mouth at bedtime.   amLODipine (NORVASC) 5 MG tablet TAKE 1 TABLET(5 MG) BY MOUTH DAILY   Cholecalciferol 25 MCG (1000 UT) capsule Take by mouth.   TURMERIC PO Take by mouth.   vitamin B-12 (CYANOCOBALAMIN) 1000 MCG tablet Take 1,000 mcg by mouth daily.   vitamin C (ASCORBIC ACID) 500 MG tablet Take 500 mg by mouth daily.   amoxicillin (AMOXIL) 500 MG tablet Take 1 tablet (500 mg total) by mouth 2 (two) times daily. (Patient not taking: Reported on 03/19/2021)   No facility-administered encounter medications on file as of 03/19/2021.    Allergies (verified) Aspirin   History: Past Medical History:  Diagnosis Date    Arthritis    Asthma    Atypical chest pain    Breast cancer (HCC)    Dysphagia    Dysphagia    GERD (gastroesophageal reflux disease)    Hypertension    Moderate aortic regurgitation    Osteoporosis    Past Surgical History:  Procedure Laterality Date   BUNIONECTOMY     CHOLECYSTECTOMY  09/2015   HAMMER TOE SURGERY     MASTECTOMY     MASTECTOMY Left 1987   Family History  Problem Relation Age of Onset   Heart disease Mother        d.95   Stroke Father        d.53   Breast cancer Sister 58       d.70s from breast cancer   Prostate cancer Brother        d.40s from prostate cancer   Cancer Maternal Uncle        d.80s from unspecified form of cancer   Breast cancer Sister 61   Cancer Brother        d.80s from unspecified form of cancer   Cancer Other        d.38s from unspecified form of cancer. This is the daughter of an unaffected brother.   Colon cancer Neg Hx    Esophageal cancer Neg Hx    Stomach cancer Neg Hx    Rectal cancer Neg Hx    Social History   Socioeconomic History   Marital status: Married  Spouse name: Not on file   Number of children: 3   Years of education: Not on file   Highest education level: Not on file  Occupational History   Occupation: retired  Tobacco Use   Smoking status: Never   Smokeless tobacco: Never  Vaping Use   Vaping Use: Never used  Substance and Sexual Activity   Alcohol use: No    Alcohol/week: 0.0 standard drinks   Drug use: No   Sexual activity: Yes  Other Topics Concern   Not on file  Social History Narrative   Lives with husband in a 2 story home.  Has 3 children.    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     Social Determinants of Health   Financial Resource Strain: Low Risk    Difficulty of Paying Living Expenses: Not hard at all  Food Insecurity: No Food Insecurity   Worried About Charity fundraiser in the Last Year: Never true   [place] in the Last Year: Never true  Transportation  Needs: No Transportation Needs   Lack of Transportation (Medical): No   Lack of Transportation (Non-Medical): No  Physical Activity: Sufficiently Active   Days of Exercise per Week: 6 days   Minutes of Exercise per Session: 30 min  Stress: No Stress Concern Present   Feeling of Stress : Not at all  Social Connections: Not on file    Tobacco Counseling Counseling given: Not Answered   Clinical Intake:  Pre-visit preparation completed: Yes  Pain : No/denies pain     Nutritional Status: BMI 25 -29 Overweight Nutritional Risks: None Diabetes: No  How often do you need to have someone help you when you read instructions, pamphlets, or other written materials from your doctor or pharmacy?: 1 - Never What is the last grade level you completed in school?: PhD  Diabetic? no  Interpreter Needed?: No  Information entered by :: NAllen LPN   Activities of Daily Living In your present state of health, do you have any difficulty performing the following activities: 03/19/2021  Hearing? N  Vision? N  Difficulty concentrating or making decisions? N  Walking or climbing stairs? N  Dressing or bathing? N  Doing errands, shopping? N  Preparing Food and eating ? N  Using the Toilet? N  In the past six months, have you accidently leaked urine? N  Do you have problems with loss of bowel control? N  Managing your Medications? N  Managing your Finances? N  Housekeeping or managing your Housekeeping? N  Some recent data might be hidden    Patient Care Team: Minette Brine, FNP as PCP - General (General Practice)  Indicate any recent Medical Services you may have received from other than [facility] providers in the past year (date may be approximate).     Assessment:   This is a routine wellness examination for Joy Cain.  Hearing/Vision screen Vision Screening - Comments:: Regular eye exams, [facility]  Dietary issues and exercise activities discussed: Current Exercise Habits: Home  exercise routine, Type of exercise: Other - see comments (row machine), Time (Minutes): 30, Frequency (Times/Week): 6, Weekly Exercise (Minutes/Week): 180   Goals Addressed             This Visit's Progress    Patient Stated       03/19/2021, wants to lose 10 pounds by the end of January       Depression Screen [facility] 2/9 Scores 03/19/2021 12/06/2019 08/08/2019 04/04/2019  01/03/2019 12/01/2018 05/12/2018  PHQ - 2 Score 0 0 0 0 0 0 0  PHQ- 9 Score - - - - - 3 -    Fall Risk Fall Risk  03/19/2021 12/06/2019 08/08/2019 04/04/2019 01/03/2019  Falls in the past year? 0 1 0 0 0  Comment - mis step - - -  Number falls in past yr: - 0 - - -  Injury with Fall? - 0 - - -  Risk for fall due to : Medication side effect History of fall(s);Medication side effect - - -  Follow up Falls evaluation completed;Education provided;Falls prevention discussed Falls evaluation completed;Education provided;Falls prevention discussed - - -    FALL RISK PREVENTION PERTAINING TO THE HOME:  Any stairs in or around the home? Yes  If so, are there any without handrails? No  Home free of loose throw rugs in walkways, pet beds, electrical cords, etc? Yes  Adequate lighting in your home to reduce risk of falls? Yes   ASSISTIVE DEVICES UTILIZED TO PREVENT FALLS:  Life alert? No  Use of a cane, walker or w/c? No  Grab bars in the bathroom? No  Shower chair or bench in shower? No  Elevated toilet seat or a handicapped toilet? No   TIMED UP AND GO:  Was the test performed? No .     Gait steady and fast without use of assistive device  Cognitive Function:     6CIT Screen 03/19/2021 12/06/2019 12/01/2018  What Year? 0 points 0 points 0 points  What month? 0 points 0 points 0 points  What time? 0 points 0 points 0 points  Count back from 20 0 points 0 points 0 points  Months in reverse 0 points 0 points 0 points  Repeat phrase 0 points 0 points 0 points  Total Score 0 0 0    Immunizations Immunization  History  Administered Date(s) Administered   Fluad Quad(high Dose 65+) 02/13/2020, 03/19/2021   Influenza, High Dose Seasonal PF 01/03/2019   PFIZER(Purple Top)SARS-COV-2 Vaccination 05/17/2019, 06/07/2019, 01/30/2020   Pneumococcal Conjugate-13 06/18/2015   Pneumococcal Polysaccharide-23 12/06/2019   Tdap 12/23/2018    TDAP status: Up to date  Flu Vaccine status: Completed at today's visit  Pneumococcal vaccine status: Up to date  Covid-19 vaccine status: Completed vaccines  Qualifies for Shingles Vaccine? Yes   Zostavax completed No   Shingrix Completed?: No.    Education has been provided regarding the importance of this vaccine. Patient has been advised to call insurance company to determine out of pocket expense if they have not yet received this vaccine. Advised may also receive vaccine at local pharmacy or Health Dept. Verbalized acceptance and understanding.  Screening Tests Health Maintenance  Topic Date Due   Zoster Vaccines- Shingrix (1 of 2) Never done   COVID-19 Vaccine (4 - Booster for Pfizer series) 03/26/2020   TETANUS/TDAP  12/22/2028   Pneumonia Vaccine 12+ Years old  Completed   INFLUENZA VACCINE  Completed   DEXA SCAN  Completed   HPV VACCINES  Aged Out    Health Maintenance  Health Maintenance Due  Topic Date Due   Zoster Vaccines- Shingrix (1 of 2) Never done   COVID-19 Vaccine (4 - Booster for Pfizer series) 03/26/2020    Colorectal cancer screening: Type of screening: Colonoscopy. Completed 02/07/2019. Repeat every 10 years  Mammogram status: Completed 07/06/2019. Repeat every year  Bone Density status: Completed 09/09/2020.   Lung Cancer Screening: (Low Dose CT Chest recommended if Age 27-80 years,  30 pack-year currently smoking OR have quit w/in 15years.) does not qualify.   Lung Cancer Screening Referral: no  Additional Screening:  Hepatitis C Screening: does not qualify;   Vision Screening: Recommended annual ophthalmology exams for  early detection of glaucoma and other disorders of the eye. Is the patient up to date with their annual eye exam?  Yes  Who is the provider or what is the name of the office in which the patient attends annual eye exams? [place] If pt is not established with a provider, would they like to be referred to a provider to establish care? No .   Dental Screening: Recommended annual dental exams for proper oral hygiene  Community Resource Referral / Chronic Care Management: CRR required this visit?  No   CCM required this visit?  No      Plan:     I have personally reviewed and noted the following in the patient's chart:   Medical and social history Use of alcohol, tobacco or illicit drugs  Current medications and supplements including opioid prescriptions.  Functional ability and status Nutritional status Physical activity Advanced directives List of other physicians Hospitalizations, surgeries, and ER visits in previous 12 months Vitals Screenings to include cognitive, depression, and falls Referrals and appointments  In addition, I have reviewed and discussed with patient certain preventive protocols, quality metrics, and best practice recommendations. A written personalized care plan for preventive services as well as general preventive health recommendations were provided to patient.     Kellie Simmering, LPN   [id number]   Nurse Notes: none

## 2021-03-19 NOTE — Patient Instructions (Signed)
[name] , Thank you for taking time to come for your Medicare Wellness Visit. I appreciate your ongoing commitment to your health goals. Please review the following plan we discussed and let me know if I can assist you in the future.   Screening recommendations/referrals: Colonoscopy: completed 02/07/2019 Mammogram: patient to schedule Bone Density: completed 09/09/2020 Recommended yearly ophthalmology/optometry visit for glaucoma screening and checkup Recommended yearly dental visit for hygiene and checkup  Vaccinations: Influenza vaccine: today Pneumococcal vaccine: completed 12/06/2019 Tdap vaccine: completed 12/23/2018, due 12/22/2028 Shingles vaccine: discussed   Covid-19:01/30/2020, 06/07/2019, 05/17/2019  Advanced directives: Please bring a copy of your POA (Power of Attorney) and/or Living Will to your next appointment.   Conditions/risks identified: none  Next appointment: Follow up in one year for your annual wellness visit    Preventive Care 65 Years and Older, Female Preventive care refers to lifestyle choices and visits with your health care provider that can promote health and wellness. What does preventive care include? A yearly physical exam. This is also called an annual well check. Dental exams once or twice a year. Routine eye exams. Ask your health care provider how often you should have your eyes checked. Personal lifestyle choices, including: Daily care of your teeth and gums. Regular physical activity. Eating a healthy diet. Avoiding tobacco and drug use. Limiting alcohol use. Practicing safe sex. Taking low-dose aspirin every day. Taking vitamin and mineral supplements as recommended by your health care provider. What happens during an annual well check? The services and screenings done by your health care provider during your annual well check will depend on your age, overall health, lifestyle risk factors, and family history of disease. Counseling  Your  health care provider may ask you questions about your: Alcohol use. Tobacco use. Drug use. Emotional well-being. Home and relationship well-being. Sexual activity. Eating habits. History of falls. Memory and ability to understand (cognition). Work and work Statistician. Reproductive health. Screening  You may have the following tests or measurements: Height, weight, and BMI. Blood pressure. Lipid and cholesterol levels. These may be checked every 5 years, or more frequently if you are over 41 years old. Skin check. Lung cancer screening. You may have this screening every year starting at age 73 if you have a 30-pack-year history of smoking and currently smoke or have quit within the past 15 years. Fecal occult blood test (FOBT) of the stool. You may have this test every year starting at age 5. Flexible sigmoidoscopy or colonoscopy. You may have a sigmoidoscopy every 5 years or a colonoscopy every 10 years starting at age 70. Hepatitis C blood test. Hepatitis B blood test. Sexually transmitted disease (STD) testing. Diabetes screening. This is done by checking your blood sugar (glucose) after you have not eaten for a while (fasting). You may have this done every 1-3 years. Bone density scan. This is done to screen for osteoporosis. You may have this done starting at age 20. Mammogram. This may be done every 1-2 years. Talk to your health care provider about how often you should have regular mammograms. Talk with your health care provider about your test results, treatment options, and if necessary, the need for more tests. Vaccines  Your health care provider may recommend certain vaccines, such as: Influenza vaccine. This is recommended every year. Tetanus, diphtheria, and acellular pertussis (Tdap, Td) vaccine. You may need a Td booster every 10 years. Zoster vaccine. You may need this after age 25. Pneumococcal 13-valent conjugate (PCV13) vaccine. One dose is  recommended after age  76. Pneumococcal polysaccharide (PPSV23) vaccine. One dose is recommended after age 10. Talk to your health care provider about which screenings and vaccines you need and how often you need them. This information is not intended to replace advice given to you by your health care provider. Make sure you discuss any questions you have with your health care provider. Document Released: 05/03/2015 Document Revised: 12/25/2015 Document Reviewed: 02/05/2015 Elsevier Interactive Patient Education  2017 [employer] Prevention in the Home Falls can cause injuries. They can happen to people of all ages. There are many things you can do to make your home safe and to help prevent falls. What can I do on the outside of my home? Regularly fix the edges of walkways and driveways and fix any cracks. Remove anything that might make you trip as you walk through a door, such as a raised step or threshold. Trim any bushes or trees on the path to your home. Use bright outdoor lighting. Clear any walking paths of anything that might make someone trip, such as rocks or tools. Regularly check to see if handrails are loose or broken. Make sure that both sides of any steps have handrails. Any raised decks and porches should have guardrails on the edges. Have any leaves, snow, or ice cleared regularly. Use sand or salt on walking paths during winter. Clean up any spills in your garage right away. This includes oil or grease spills. What can I do in the bathroom? Use night lights. Install grab bars by the toilet and in the tub and shower. Do not use towel bars as grab bars. Use non-skid mats or decals in the tub or shower. If you need to sit down in the shower, use a plastic, non-slip stool. Keep the floor dry. Clean up any water that spills on the floor as soon as it happens. Remove soap buildup in the tub or shower regularly. Attach bath mats securely with double-sided non-slip rug tape. Do not have throw  rugs and other things on the floor that can make you trip. What can I do in the bedroom? Use night lights. Make sure that you have a light by your bed that is easy to reach. Do not use any sheets or blankets that are too big for your bed. They should not hang down onto the floor. Have a firm chair that has side arms. You can use this for support while you get dressed. Do not have throw rugs and other things on the floor that can make you trip. What can I do in the kitchen? Clean up any spills right away. Avoid walking on wet floors. Keep items that you use a lot in easy-to-reach places. If you need to reach something above you, use a strong step stool that has a grab bar. Keep electrical cords out of the way. Do not use floor polish or wax that makes floors slippery. If you must use wax, use non-skid floor wax. Do not have throw rugs and other things on the floor that can make you trip. What can I do with my stairs? Do not leave any items on the stairs. Make sure that there are handrails on both sides of the stairs and use them. Fix handrails that are broken or loose. Make sure that handrails are as long as the stairways. Check any carpeting to make sure that it is firmly attached to the stairs. Fix any carpet that is loose or worn. Avoid having  throw rugs at the top or bottom of the stairs. If you do have throw rugs, attach them to the floor with carpet tape. Make sure that you have a light switch at the top of the stairs and the bottom of the stairs. If you do not have them, ask someone to add them for you. What else can I do to help prevent falls? Wear shoes that: Do not have high heels. Have rubber bottoms. Are comfortable and fit you well. Are closed at the toe. Do not wear sandals. If you use a stepladder: Make sure that it is fully opened. Do not climb a closed stepladder. Make sure that both sides of the stepladder are locked into place. Ask someone to hold it for you, if  possible. Clearly mark and make sure that you can see: Any grab bars or handrails. First and last steps. Where the edge of each step is. Use tools that help you move around (mobility aids) if they are needed. These include: Canes. Walkers. Scooters. Crutches. Turn on the lights when you go into a dark area. Replace any light bulbs as soon as they burn out. Set up your furniture so you have a clear path. Avoid moving your furniture around. If any of your floors are uneven, fix them. If there are any pets around you, be aware of where they are. Review your medicines with your doctor. Some medicines can make you feel dizzy. This can increase your chance of falling. Ask your doctor what other things that you can do to help prevent falls. This information is not intended to replace advice given to you by your health care provider. Make sure you discuss any questions you have with your health care provider. Document Released: 01/31/2009 Document Revised: 09/12/2015 Document Reviewed: 05/11/2014 Elsevier Interactive Patient Education  2017 [employer].

## 2021-04-28 DIAGNOSIS — Z23 Encounter for immunization: Secondary | ICD-10-CM | POA: Diagnosis not present

## 2021-05-13 ENCOUNTER — Encounter: Payer: Self-pay | Admitting: Nurse Practitioner

## 2021-05-13 ENCOUNTER — Other Ambulatory Visit: Payer: Self-pay

## 2021-05-13 ENCOUNTER — Ambulatory Visit (INDEPENDENT_AMBULATORY_CARE_PROVIDER_SITE_OTHER): Payer: Medicare Other | Admitting: Nurse Practitioner

## 2021-05-13 VITALS — BP 122/78 | HR 88 | Temp 98.4°F | Ht 68.0 in | Wt 151.2 lb

## 2021-05-13 DIAGNOSIS — G8929 Other chronic pain: Secondary | ICD-10-CM

## 2021-05-13 DIAGNOSIS — I1310 Hypertensive heart and chronic kidney disease without heart failure, with stage 1 through stage 4 chronic kidney disease, or unspecified chronic kidney disease: Secondary | ICD-10-CM | POA: Diagnosis not present

## 2021-05-13 DIAGNOSIS — Z853 Personal history of malignant neoplasm of breast: Secondary | ICD-10-CM | POA: Diagnosis not present

## 2021-05-13 DIAGNOSIS — I129 Hypertensive chronic kidney disease with stage 1 through stage 4 chronic kidney disease, or unspecified chronic kidney disease: Secondary | ICD-10-CM

## 2021-05-13 DIAGNOSIS — R519 Headache, unspecified: Secondary | ICD-10-CM

## 2021-05-13 DIAGNOSIS — E7800 Pure hypercholesterolemia, unspecified: Secondary | ICD-10-CM

## 2021-05-13 DIAGNOSIS — I2510 Atherosclerotic heart disease of native coronary artery without angina pectoris: Secondary | ICD-10-CM

## 2021-05-13 DIAGNOSIS — I10 Essential (primary) hypertension: Secondary | ICD-10-CM

## 2021-05-13 DIAGNOSIS — N183 Chronic kidney disease, stage 3 unspecified: Secondary | ICD-10-CM | POA: Diagnosis not present

## 2021-05-13 DIAGNOSIS — M25562 Pain in left knee: Secondary | ICD-10-CM | POA: Diagnosis not present

## 2021-05-13 DIAGNOSIS — M25561 Pain in right knee: Secondary | ICD-10-CM

## 2021-05-13 MED ORDER — AMLODIPINE BESYLATE 5 MG PO TABS: ORAL_TABLET | ORAL | 2 refills | Status: DC

## 2021-05-13 MED ORDER — AMITRIPTYLINE HCL 25 MG PO TABS: 25.0000 mg | ORAL_TABLET | Freq: Every day | ORAL | 1 refills | Status: DC

## 2021-05-13 NOTE — Patient Instructions (Addendum)
Hypertension, Adult Hypertension is another name for high blood pressure. High blood pressure forces your heart to work harder to pump blood. This can cause problems over time. There are two numbers in a blood pressure reading. There is a top number (systolic) over a bottom number (diastolic). It is best to have a blood pressure that is below 120/80. Healthy choices can help lower your blood pressure, or you may need medicine to help lower it. What are the causes? The cause of this condition is not known. Some conditions may be related to high blood pressure. What increases the risk? Smoking. Having type 2 diabetes mellitus, high cholesterol, or both. Not getting enough exercise or physical activity. Being overweight. Having too much fat, sugar, calories, or salt (sodium) in your diet. Drinking too much alcohol. Having long-term (chronic) kidney disease. Having a family history of high blood pressure. Age. Risk increases with age. Race. You may be at higher risk if you are African American. Gender. Men are at higher risk than women before age 29. After age 92, women are at higher risk than men. Having obstructive sleep apnea. Stress. What are the signs or symptoms? High blood pressure may not cause symptoms. Very high blood pressure (hypertensive crisis) may cause: Headache. Feelings of worry or nervousness (anxiety). Shortness of breath. Nosebleed. A feeling of being sick to your stomach (nausea). Throwing up (vomiting). Changes in how you see. Very bad chest pain. Seizures. How is this treated? This condition is treated by making healthy lifestyle changes, such as: Eating healthy foods. Exercising more. Drinking less alcohol. Your health care provider may prescribe medicine if lifestyle changes are not enough to get your blood pressure under control, and if: Your top number is above 130. Your bottom number is above 80. Your personal target blood pressure may vary. Follow  these instructions at home: Eating and drinking  If told, follow the DASH eating plan. To follow this plan: Fill one half of your plate at each meal with fruits and vegetables. Fill one fourth of your plate at each meal with whole grains. Whole grains include whole-wheat pasta, brown rice, and whole-grain bread. Eat or drink low-fat dairy products, such as skim milk or low-fat yogurt. Fill one fourth of your plate at each meal with low-fat (lean) proteins. Low-fat proteins include fish, chicken without skin, eggs, beans, and tofu. Avoid fatty meat, cured and processed meat, or chicken with skin. Avoid pre-made or processed food. Eat less than 1,500 mg of salt each day. Do not drink alcohol if: Your doctor tells you not to drink. You are pregnant, may be pregnant, or are planning to become pregnant. If you drink alcohol: Limit how much you use to: 0-1 drink a day for women. 0-2 drinks a day for men. Be aware of how much alcohol is in your drink. In the [place] one drink equals one 12 oz bottle of beer (355 mL), one 5 oz glass of wine (148 mL), or one 1 oz glass of hard liquor (44 mL). Lifestyle  Work with your doctor to stay at a healthy weight or to lose weight. Ask your doctor what the best weight is for you. Get at least 30 minutes of exercise most days of the week. This may include walking, swimming, or biking. Get at least 30 minutes of exercise that strengthens your muscles (resistance exercise) at least 3 days a week. This may include lifting weights or doing Pilates. Do not use any products that contain nicotine or tobacco, such  as cigarettes, e-cigarettes, and chewing tobacco. If you need help quitting, ask your doctor. Check your blood pressure at home as told by your doctor. Keep all follow-up visits as told by your doctor. This is important. Medicines Take over-the-counter and prescription medicines only as told by your doctor. Follow directions carefully. Do not skip doses of  blood pressure medicine. The medicine does not work as well if you skip doses. Skipping doses also puts you at risk for problems. Ask your doctor about side effects or reactions to medicines that you should watch for. Contact a doctor if you: Think you are having a reaction to the medicine you are taking. Have headaches that keep coming back (recurring). Feel dizzy. Have swelling in your ankles. Have trouble with your vision. Get help right away if you: Get a very bad headache. Start to feel mixed up (confused). Feel weak or numb. Feel faint. Have very bad pain in your: Chest. Belly (abdomen). Throw up more than once. Have trouble breathing. Summary Hypertension is another name for high blood pressure. High blood pressure forces your heart to work harder to pump blood. For most people, a normal blood pressure is less than 120/80. Making healthy choices can help lower blood pressure. If your blood pressure does not get lower with healthy choices, you may need to take medicine. This information is not intended to replace advice given to you by your health care provider. Make sure you discuss any questions you have with your health care provider. Document Revised: 12/15/2017 Document Reviewed: 12/15/2017 Elsevier Patient Education  [employer].  Voltaren gel is good for knee pain

## 2021-05-13 NOTE — Progress Notes (Signed)
I,Victoria T Hamilton,acting as a Education administrator for Minette Brine, FNP.,have documented all relevant documentation on the behalf of Minette Brine, FNP,as directed by  Minette Brine, FNP while in the presence of Minette Brine, [place].   This visit occurred during the SARS-CoV-2 public health emergency.  Safety protocols were in place, including screening questions prior to the visit, additional usage of staff PPE, and extensive cleaning of exam room while observing appropriate contact time as indicated for disinfecting solutions.  Subjective:     Patient ID: Joy Cain , female    DOB: 22-Jun-1935 , 86 y.o.   MRN: [id number]   Chief Complaint  Patient presents with   Hypertension    HPI  Pt presents today for med f/u & BPC. She was last seen 09/05/2020. She currently takes amlodipine 5MG, pt reports taking medication at night " around 10/11. She does not exercise regularly; it has been too cold. Pt drinks 3 -16oz a day.     Past Medical History:  Diagnosis Date   Arthritis    Asthma    Atypical chest pain    Breast cancer (HCC)    Dysphagia    Dysphagia    GERD (gastroesophageal reflux disease)    Hypertension    Moderate aortic regurgitation    Osteoporosis      Family History  Problem Relation Age of Onset   Heart disease Mother        d.95   Stroke Father        d.26   Breast cancer Sister 13       d.70s from breast cancer   Prostate cancer Brother        d.40s from prostate cancer   Cancer Maternal Uncle        d.80s from unspecified form of cancer   Breast cancer Sister 3   Cancer Brother        d.80s from unspecified form of cancer   Cancer Other        d.38s from unspecified form of cancer. This is the daughter of an unaffected brother.   Colon cancer Neg Hx    Esophageal cancer Neg Hx    Stomach cancer Neg Hx    Rectal cancer Neg Hx      Current Outpatient Medications:    Cholecalciferol 25 MCG (1000 UT) capsule, Take by mouth., Disp: , Rfl:    TURMERIC PO, Take  by mouth., Disp: , Rfl:    vitamin B-12 (CYANOCOBALAMIN) 1000 MCG tablet, Take 1,000 mcg by mouth daily., Disp: , Rfl:    vitamin C (ASCORBIC ACID) 500 MG tablet, Take 500 mg by mouth daily., Disp: , Rfl:    amitriptyline (ELAVIL) 25 MG tablet, Take 1 tablet (25 mg total) by mouth at bedtime., Disp: 90 tablet, Rfl: 1   amLODipine (NORVASC) 5 MG tablet, TAKE 1 TABLET(5 MG) BY MOUTH DAILY, Disp: 90 tablet, Rfl: 2   Allergies  Allergen Reactions   Aspirin Other (See Comments)    GI TRACT UPSET     Review of Systems  Constitutional: Negative.   Respiratory: Negative.    Cardiovascular: Negative.   Neurological: Negative.   Psychiatric/Behavioral: Negative.      Today's Vitals   05/13/21 1113  BP: 122/78  Pulse: 88  Temp: 98.4 F (36.9 C)  Weight: 151 lb 3.2 oz (68.6 kg)  Height: '5\' 8"'  (1.727 m)   Body mass index is 22.99 kg/m.  Wt Readings from Last 3 Encounters:  05/13/21 151 lb 3.2  oz (68.6 kg)  03/19/21 152 lb 6.4 oz (69.1 kg)  09/05/20 147 lb 3.2 oz (66.8 kg)    Objective:  Physical Exam Vitals reviewed.  Constitutional:      General: She is not in acute distress.    Appearance: Normal appearance. She is well-developed.  Cardiovascular:     Heart sounds: Normal heart sounds. No murmur heard. Pulmonary:     Effort: Pulmonary effort is normal. No respiratory distress.     Breath sounds: Normal breath sounds. No wheezing.  Skin:    Capillary Refill: Capillary refill takes less than 2 seconds.  Neurological:     General: No focal deficit present.     Mental Status: She is alert and oriented to person, place, and time.     Cranial Nerves: No cranial nerve deficit.     Motor: No weakness.  Psychiatric:        Mood and Affect: Mood normal.        Speech: Speech normal.        Behavior: Behavior normal.        Thought Content: Thought content normal.        Judgment: Judgment normal.        Assessment And Plan:     1. Benign hypertension with CKD (chronic  kidney disease) stage III (HCC) Comments: Blood pressure is well controlled, continue current medications  2. Elevated LDL cholesterol level - Lipid panel  3. Atherosclerotic cardiovascular disease Comments: She had slightly elevate cholesterol levels, diet controlled.  - BMP8+eGFR - Lipid panel  4. History of breast cancer  5. Chronic pain of both knees Comments: Encouraged to use pain cram as needed.   6. Chronic nonintractable headache, unspecified headache type Comments: Stable, doing well with amitriptylline - amitriptyline (ELAVIL) 25 MG tablet; Take 1 tablet (25 mg total) by mouth at bedtime.  Dispense: 90 tablet; Refill: 1     Patient was given opportunity to ask questions. Patient verbalized understanding of the plan and was able to repeat key elements of the plan. All questions were answered to their satisfaction.  Minette Brine, FNP    I, Minette Brine, FNP, have reviewed all documentation for this visit. The documentation on 05/13/21 for the exam, diagnosis, procedures, and orders are all accurate and complete.  IF YOU HAVE BEEN REFERRED TO A SPECIALIST, IT MAY TAKE 1-2 WEEKS TO SCHEDULE/PROCESS THE REFERRAL. IF YOU HAVE NOT HEARD FROM US/SPECIALIST IN TWO WEEKS, PLEASE GIVE [place] A CALL AT [phone] X [phone].   THE PATIENT IS ENCOURAGED TO PRACTICE SOCIAL DISTANCING DUE TO THE COVID-19 PANDEMIC.

## 2021-05-14 LAB — BMP8+EGFR
BUN/Creatinine Ratio: 12 (ref 12–28)
BUN: 13 mg/dL (ref 8–27)
CO2: 25 mmol/L (ref 20–29)
Calcium: 9.3 mg/dL (ref 8.7–10.3)
Chloride: 108 mmol/L — ABNORMAL HIGH (ref 96–106)
Creatinine, Ser: 1.05 mg/dL — ABNORMAL HIGH (ref 0.57–1.00)
Glucose: 86 mg/dL (ref 70–99)
Potassium: 4.3 mmol/L (ref 3.5–5.2)
Sodium: 144 mmol/L (ref 134–144)
eGFR: 53 mL/min/{1.73_m2} — ABNORMAL LOW (ref >59)

## 2021-05-14 LAB — LIPID PANEL
Chol/HDL Ratio: 4.3 ratio (ref 0.0–4.4)
Cholesterol, Total: 204 mg/dL — ABNORMAL HIGH (ref 100–199)
HDL: 48 mg/dL (ref >39)
LDL Chol Calc (NIH): 143 mg/dL — ABNORMAL HIGH (ref 0–99)
Triglycerides: 70 mg/dL (ref 0–149)
VLDL Cholesterol Cal: 13 mg/dL (ref 5–40)

## 2021-09-10 ENCOUNTER — Ambulatory Visit: Payer: Medicare Other | Admitting: Nurse Practitioner

## 2021-10-08 ENCOUNTER — Other Ambulatory Visit: Payer: Self-pay

## 2021-10-08 DIAGNOSIS — I10 Essential (primary) hypertension: Secondary | ICD-10-CM

## 2021-10-08 MED ORDER — AMLODIPINE BESYLATE 5 MG PO TABS: ORAL_TABLET | ORAL | 0 refills | Status: AC

## 2021-12-09 ENCOUNTER — Other Ambulatory Visit: Payer: Self-pay

## 2021-12-09 DIAGNOSIS — I10 Essential (primary) hypertension: Secondary | ICD-10-CM

## 2021-12-09 DIAGNOSIS — G8929 Other chronic pain: Secondary | ICD-10-CM

## 2021-12-09 MED ORDER — AMITRIPTYLINE HCL 25 MG PO TABS: 25.0000 mg | ORAL_TABLET | Freq: Every day | ORAL | 1 refills | Status: AC

## 2021-12-30 DIAGNOSIS — H52223 Regular astigmatism, bilateral: Secondary | ICD-10-CM | POA: Diagnosis not present

## 2021-12-30 DIAGNOSIS — H401132 Primary open-angle glaucoma, bilateral, moderate stage: Secondary | ICD-10-CM | POA: Diagnosis not present

## 2021-12-30 DIAGNOSIS — H43813 Vitreous degeneration, bilateral: Secondary | ICD-10-CM | POA: Diagnosis not present

## 2022-02-10 DIAGNOSIS — I38 Endocarditis, valve unspecified: Secondary | ICD-10-CM | POA: Diagnosis not present

## 2022-02-10 DIAGNOSIS — Z1239 Encounter for other screening for malignant neoplasm of breast: Secondary | ICD-10-CM | POA: Diagnosis not present

## 2022-02-10 DIAGNOSIS — M2550 Pain in unspecified joint: Secondary | ICD-10-CM | POA: Diagnosis not present

## 2022-02-10 DIAGNOSIS — Z862 Personal history of diseases of the blood and blood-forming organs and certain disorders involving the immune mechanism: Secondary | ICD-10-CM | POA: Diagnosis not present

## 2022-02-10 DIAGNOSIS — Z853 Personal history of malignant neoplasm of breast: Secondary | ICD-10-CM | POA: Diagnosis not present

## 2022-02-10 DIAGNOSIS — Z79899 Other long term (current) drug therapy: Secondary | ICD-10-CM | POA: Diagnosis not present

## 2022-02-10 DIAGNOSIS — M5416 Radiculopathy, lumbar region: Secondary | ICD-10-CM | POA: Diagnosis not present

## 2022-02-10 DIAGNOSIS — R21 Rash and other nonspecific skin eruption: Secondary | ICD-10-CM | POA: Diagnosis not present

## 2022-02-10 DIAGNOSIS — G629 Polyneuropathy, unspecified: Secondary | ICD-10-CM | POA: Diagnosis not present

## 2022-02-10 DIAGNOSIS — B351 Tinea unguium: Secondary | ICD-10-CM | POA: Diagnosis not present

## 2022-02-10 DIAGNOSIS — R519 Headache, unspecified: Secondary | ICD-10-CM | POA: Diagnosis not present

## 2022-02-10 DIAGNOSIS — Z23 Encounter for immunization: Secondary | ICD-10-CM | POA: Diagnosis not present

## 2022-02-17 DIAGNOSIS — I351 Nonrheumatic aortic (valve) insufficiency: Secondary | ICD-10-CM | POA: Diagnosis not present

## 2022-02-17 DIAGNOSIS — I340 Nonrheumatic mitral (valve) insufficiency: Secondary | ICD-10-CM | POA: Diagnosis not present

## 2022-02-17 DIAGNOSIS — I10 Essential (primary) hypertension: Secondary | ICD-10-CM | POA: Diagnosis not present

## 2022-03-03 DIAGNOSIS — Z803 Family history of malignant neoplasm of breast: Secondary | ICD-10-CM | POA: Diagnosis not present

## 2022-03-03 DIAGNOSIS — Z1231 Encounter for screening mammogram for malignant neoplasm of breast: Secondary | ICD-10-CM | POA: Diagnosis not present

## 2022-03-03 DIAGNOSIS — Z853 Personal history of malignant neoplasm of breast: Secondary | ICD-10-CM | POA: Diagnosis not present

## 2022-03-05 DIAGNOSIS — R519 Headache, unspecified: Secondary | ICD-10-CM | POA: Diagnosis not present

## 2022-03-05 DIAGNOSIS — R200 Anesthesia of skin: Secondary | ICD-10-CM | POA: Diagnosis not present

## 2022-03-09 DIAGNOSIS — L602 Onychogryphosis: Secondary | ICD-10-CM | POA: Diagnosis not present

## 2022-03-09 DIAGNOSIS — M2012 Hallux valgus (acquired), left foot: Secondary | ICD-10-CM | POA: Diagnosis not present

## 2022-03-09 DIAGNOSIS — M205X2 Other deformities of toe(s) (acquired), left foot: Secondary | ICD-10-CM | POA: Diagnosis not present

## 2022-03-09 DIAGNOSIS — L603 Nail dystrophy: Secondary | ICD-10-CM | POA: Diagnosis not present

## 2022-03-20 DIAGNOSIS — M5416 Radiculopathy, lumbar region: Secondary | ICD-10-CM | POA: Diagnosis not present

## 2022-03-20 DIAGNOSIS — R519 Headache, unspecified: Secondary | ICD-10-CM | POA: Diagnosis not present

## 2022-03-24 DIAGNOSIS — L602 Onychogryphosis: Secondary | ICD-10-CM | POA: Diagnosis not present

## 2022-03-24 DIAGNOSIS — M2012 Hallux valgus (acquired), left foot: Secondary | ICD-10-CM | POA: Diagnosis not present

## 2022-03-24 DIAGNOSIS — M205X2 Other deformities of toe(s) (acquired), left foot: Secondary | ICD-10-CM | POA: Diagnosis not present

## 2022-03-26 ENCOUNTER — Ambulatory Visit: Payer: Medicare Other

## 2022-04-02 DIAGNOSIS — I340 Nonrheumatic mitral (valve) insufficiency: Secondary | ICD-10-CM | POA: Diagnosis not present

## 2022-04-23 DIAGNOSIS — H11431 Conjunctival hyperemia, right eye: Secondary | ICD-10-CM | POA: Diagnosis not present

## 2022-05-12 DIAGNOSIS — I340 Nonrheumatic mitral (valve) insufficiency: Secondary | ICD-10-CM | POA: Diagnosis not present

## 2022-06-04 DIAGNOSIS — R519 Headache, unspecified: Secondary | ICD-10-CM | POA: Diagnosis not present

## 2022-06-04 DIAGNOSIS — R768 Other specified abnormal immunological findings in serum: Secondary | ICD-10-CM | POA: Diagnosis not present

## 2022-06-04 DIAGNOSIS — M25469 Effusion, unspecified knee: Secondary | ICD-10-CM | POA: Diagnosis not present

## 2022-06-04 DIAGNOSIS — G629 Polyneuropathy, unspecified: Secondary | ICD-10-CM | POA: Diagnosis not present

## 2022-06-04 DIAGNOSIS — Z853 Personal history of malignant neoplasm of breast: Secondary | ICD-10-CM | POA: Diagnosis not present

## 2022-06-04 DIAGNOSIS — M5416 Radiculopathy, lumbar region: Secondary | ICD-10-CM | POA: Diagnosis not present

## 2022-06-04 DIAGNOSIS — Z780 Asymptomatic menopausal state: Secondary | ICD-10-CM | POA: Diagnosis not present

## 2022-06-04 DIAGNOSIS — I38 Endocarditis, valve unspecified: Secondary | ICD-10-CM | POA: Diagnosis not present

## 2022-06-04 DIAGNOSIS — R760 Raised antibody titer: Secondary | ICD-10-CM | POA: Diagnosis not present

## 2022-06-23 DIAGNOSIS — M48062 Spinal stenosis, lumbar region with neurogenic claudication: Secondary | ICD-10-CM | POA: Diagnosis not present

## 2022-06-23 DIAGNOSIS — M545 Low back pain, unspecified: Secondary | ICD-10-CM | POA: Diagnosis not present

## 2022-06-23 DIAGNOSIS — M4316 Spondylolisthesis, lumbar region: Secondary | ICD-10-CM | POA: Diagnosis not present

## 2022-06-27 DIAGNOSIS — Z9012 Acquired absence of left breast and nipple: Secondary | ICD-10-CM | POA: Diagnosis not present

## 2022-06-27 DIAGNOSIS — R590 Localized enlarged lymph nodes: Secondary | ICD-10-CM | POA: Diagnosis not present

## 2022-07-01 DIAGNOSIS — M8589 Other specified disorders of bone density and structure, multiple sites: Secondary | ICD-10-CM | POA: Diagnosis not present

## 2022-07-02 DIAGNOSIS — R222 Localized swelling, mass and lump, trunk: Secondary | ICD-10-CM | POA: Diagnosis not present

## 2022-07-06 DIAGNOSIS — M5136 Other intervertebral disc degeneration, lumbar region: Secondary | ICD-10-CM | POA: Diagnosis not present

## 2022-07-06 DIAGNOSIS — M47817 Spondylosis without myelopathy or radiculopathy, lumbosacral region: Secondary | ICD-10-CM | POA: Diagnosis not present

## 2022-07-06 DIAGNOSIS — G894 Chronic pain syndrome: Secondary | ICD-10-CM | POA: Diagnosis not present

## 2022-07-06 DIAGNOSIS — R590 Localized enlarged lymph nodes: Secondary | ICD-10-CM | POA: Diagnosis not present

## 2022-07-07 DIAGNOSIS — M170 Bilateral primary osteoarthritis of knee: Secondary | ICD-10-CM | POA: Diagnosis not present

## 2022-07-07 DIAGNOSIS — M25562 Pain in left knee: Secondary | ICD-10-CM | POA: Diagnosis not present

## 2022-07-07 DIAGNOSIS — M25561 Pain in right knee: Secondary | ICD-10-CM | POA: Diagnosis not present

## 2022-07-08 DIAGNOSIS — M5459 Other low back pain: Secondary | ICD-10-CM | POA: Diagnosis not present

## 2022-07-08 DIAGNOSIS — M4807 Spinal stenosis, lumbosacral region: Secondary | ICD-10-CM | POA: Diagnosis not present

## 2022-07-08 DIAGNOSIS — C50919 Malignant neoplasm of unspecified site of unspecified female breast: Secondary | ICD-10-CM | POA: Diagnosis not present

## 2022-07-08 DIAGNOSIS — M6281 Muscle weakness (generalized): Secondary | ICD-10-CM | POA: Diagnosis not present

## 2022-07-08 DIAGNOSIS — M4316 Spondylolisthesis, lumbar region: Secondary | ICD-10-CM | POA: Diagnosis not present

## 2022-07-09 DIAGNOSIS — M47817 Spondylosis without myelopathy or radiculopathy, lumbosacral region: Secondary | ICD-10-CM | POA: Diagnosis not present

## 2022-07-15 DIAGNOSIS — M6281 Muscle weakness (generalized): Secondary | ICD-10-CM | POA: Diagnosis not present

## 2022-07-15 DIAGNOSIS — M4316 Spondylolisthesis, lumbar region: Secondary | ICD-10-CM | POA: Diagnosis not present

## 2022-07-15 DIAGNOSIS — M4807 Spinal stenosis, lumbosacral region: Secondary | ICD-10-CM | POA: Diagnosis not present

## 2022-07-15 DIAGNOSIS — M5459 Other low back pain: Secondary | ICD-10-CM | POA: Diagnosis not present

## 2022-07-22 DIAGNOSIS — M4807 Spinal stenosis, lumbosacral region: Secondary | ICD-10-CM | POA: Diagnosis not present

## 2022-07-22 DIAGNOSIS — M4316 Spondylolisthesis, lumbar region: Secondary | ICD-10-CM | POA: Diagnosis not present

## 2022-07-22 DIAGNOSIS — M6281 Muscle weakness (generalized): Secondary | ICD-10-CM | POA: Diagnosis not present

## 2022-07-22 DIAGNOSIS — M5459 Other low back pain: Secondary | ICD-10-CM | POA: Diagnosis not present

## 2022-07-23 DIAGNOSIS — M47817 Spondylosis without myelopathy or radiculopathy, lumbosacral region: Secondary | ICD-10-CM | POA: Diagnosis not present

## 2022-07-24 DIAGNOSIS — M6281 Muscle weakness (generalized): Secondary | ICD-10-CM | POA: Diagnosis not present

## 2022-07-24 DIAGNOSIS — M5459 Other low back pain: Secondary | ICD-10-CM | POA: Diagnosis not present

## 2022-07-24 DIAGNOSIS — M4807 Spinal stenosis, lumbosacral region: Secondary | ICD-10-CM | POA: Diagnosis not present

## 2022-07-24 DIAGNOSIS — M4316 Spondylolisthesis, lumbar region: Secondary | ICD-10-CM | POA: Diagnosis not present

## 2022-07-28 DIAGNOSIS — M5459 Other low back pain: Secondary | ICD-10-CM | POA: Diagnosis not present

## 2022-07-28 DIAGNOSIS — M4316 Spondylolisthesis, lumbar region: Secondary | ICD-10-CM | POA: Diagnosis not present

## 2022-07-28 DIAGNOSIS — M4807 Spinal stenosis, lumbosacral region: Secondary | ICD-10-CM | POA: Diagnosis not present

## 2022-07-28 DIAGNOSIS — M6281 Muscle weakness (generalized): Secondary | ICD-10-CM | POA: Diagnosis not present

## 2022-07-31 DIAGNOSIS — M5459 Other low back pain: Secondary | ICD-10-CM | POA: Diagnosis not present

## 2022-07-31 DIAGNOSIS — M4807 Spinal stenosis, lumbosacral region: Secondary | ICD-10-CM | POA: Diagnosis not present

## 2022-07-31 DIAGNOSIS — M4316 Spondylolisthesis, lumbar region: Secondary | ICD-10-CM | POA: Diagnosis not present

## 2022-07-31 DIAGNOSIS — M6281 Muscle weakness (generalized): Secondary | ICD-10-CM | POA: Diagnosis not present

## 2022-08-04 DIAGNOSIS — M4807 Spinal stenosis, lumbosacral region: Secondary | ICD-10-CM | POA: Diagnosis not present

## 2022-08-04 DIAGNOSIS — M6281 Muscle weakness (generalized): Secondary | ICD-10-CM | POA: Diagnosis not present

## 2022-08-04 DIAGNOSIS — M4316 Spondylolisthesis, lumbar region: Secondary | ICD-10-CM | POA: Diagnosis not present

## 2022-08-04 DIAGNOSIS — M5459 Other low back pain: Secondary | ICD-10-CM | POA: Diagnosis not present

## 2022-08-06 DIAGNOSIS — M4316 Spondylolisthesis, lumbar region: Secondary | ICD-10-CM | POA: Diagnosis not present

## 2022-08-06 DIAGNOSIS — M5459 Other low back pain: Secondary | ICD-10-CM | POA: Diagnosis not present

## 2022-08-06 DIAGNOSIS — M6281 Muscle weakness (generalized): Secondary | ICD-10-CM | POA: Diagnosis not present

## 2022-08-06 DIAGNOSIS — M4807 Spinal stenosis, lumbosacral region: Secondary | ICD-10-CM | POA: Diagnosis not present
